# Patient Record
Sex: Female | Born: 1955 | Race: White | Hispanic: No | Marital: Married | Smoking: Never smoker
Health system: Southern US, Community
[De-identification: ages and names within clinical notes are randomized; demographics above are authoritative.]

## PROBLEM LIST (undated history)

## (undated) DIAGNOSIS — E079 Disorder of thyroid, unspecified: Secondary | ICD-10-CM

## (undated) DIAGNOSIS — K219 Gastro-esophageal reflux disease without esophagitis: Secondary | ICD-10-CM

## (undated) DIAGNOSIS — R011 Cardiac murmur, unspecified: Secondary | ICD-10-CM

## (undated) DIAGNOSIS — F419 Anxiety disorder, unspecified: Secondary | ICD-10-CM

## (undated) DIAGNOSIS — R600 Localized edema: Secondary | ICD-10-CM

## (undated) DIAGNOSIS — M1990 Unspecified osteoarthritis, unspecified site: Secondary | ICD-10-CM

## (undated) DIAGNOSIS — I38 Endocarditis, valve unspecified: Secondary | ICD-10-CM

## (undated) DIAGNOSIS — J45909 Unspecified asthma, uncomplicated: Secondary | ICD-10-CM

## (undated) DIAGNOSIS — R079 Chest pain, unspecified: Secondary | ICD-10-CM

## (undated) DIAGNOSIS — E559 Vitamin D deficiency, unspecified: Secondary | ICD-10-CM

## (undated) DIAGNOSIS — I079 Rheumatic tricuspid valve disease, unspecified: Secondary | ICD-10-CM

## (undated) DIAGNOSIS — K829 Disease of gallbladder, unspecified: Secondary | ICD-10-CM

## (undated) DIAGNOSIS — R0600 Dyspnea, unspecified: Secondary | ICD-10-CM

## (undated) DIAGNOSIS — K589 Irritable bowel syndrome without diarrhea: Secondary | ICD-10-CM

## (undated) DIAGNOSIS — K449 Diaphragmatic hernia without obstruction or gangrene: Secondary | ICD-10-CM

## (undated) DIAGNOSIS — E039 Hypothyroidism, unspecified: Secondary | ICD-10-CM

## (undated) DIAGNOSIS — M2550 Pain in unspecified joint: Secondary | ICD-10-CM

## (undated) DIAGNOSIS — E785 Hyperlipidemia, unspecified: Secondary | ICD-10-CM

## (undated) DIAGNOSIS — D649 Anemia, unspecified: Secondary | ICD-10-CM

## (undated) DIAGNOSIS — E538 Deficiency of other specified B group vitamins: Secondary | ICD-10-CM

## (undated) DIAGNOSIS — K5900 Constipation, unspecified: Secondary | ICD-10-CM

## (undated) DIAGNOSIS — M259 Joint disorder, unspecified: Secondary | ICD-10-CM

## (undated) DIAGNOSIS — I10 Essential (primary) hypertension: Secondary | ICD-10-CM

## (undated) DIAGNOSIS — L659 Nonscarring hair loss, unspecified: Secondary | ICD-10-CM

## (undated) HISTORY — DX: Gastro-esophageal reflux disease without esophagitis: K21.9

## (undated) HISTORY — DX: Rheumatic tricuspid valve disease, unspecified: I07.9

## (undated) HISTORY — DX: Nonscarring hair loss, unspecified: L65.9

## (undated) HISTORY — DX: Dyspnea, unspecified: R06.00

## (undated) HISTORY — DX: Endocarditis, valve unspecified: I38

## (undated) HISTORY — DX: Chest pain, unspecified: R07.9

## (undated) HISTORY — DX: Unspecified asthma, uncomplicated: J45.909

## (undated) HISTORY — DX: Hypothyroidism, unspecified: E03.9

## (undated) HISTORY — DX: Deficiency of other specified B group vitamins: E53.8

## (undated) HISTORY — DX: Diaphragmatic hernia without obstruction or gangrene: K44.9

## (undated) HISTORY — DX: Disease of gallbladder, unspecified: K82.9

## (undated) HISTORY — DX: Pain in unspecified joint: M25.50

## (undated) HISTORY — DX: Unspecified osteoarthritis, unspecified site: M19.90

## (undated) HISTORY — DX: Cardiac murmur, unspecified: R01.1

## (undated) HISTORY — DX: Localized edema: R60.0

## (undated) HISTORY — PX: KNEE SURGERY: SHX244

## (undated) HISTORY — DX: Joint disorder, unspecified: M25.9

## (undated) HISTORY — DX: Constipation, unspecified: K59.00

## (undated) HISTORY — DX: Irritable bowel syndrome, unspecified: K58.9

## (undated) HISTORY — DX: Vitamin D deficiency, unspecified: E55.9

## (undated) HISTORY — DX: Essential (primary) hypertension: I10

## (undated) HISTORY — DX: Hyperlipidemia, unspecified: E78.5

## (undated) HISTORY — DX: Anemia, unspecified: D64.9

## (undated) HISTORY — DX: Anxiety disorder, unspecified: F41.9

## (undated) HISTORY — DX: Disorder of thyroid, unspecified: E07.9

---

## 1981-07-15 HISTORY — PX: TMJ ARTHROPLASTY: SHX1066

## 1992-11-14 HISTORY — PX: MYOMECTOMY: SHX85

## 1997-02-12 HISTORY — PX: ABDOMINAL HYSTERECTOMY: SHX81

## 1999-11-27 ENCOUNTER — Other Ambulatory Visit: Admission: RE | Admit: 1999-11-27 | Discharge: 1999-11-27 | Payer: Self-pay | Admitting: Obstetrics & Gynecology

## 2000-03-03 ENCOUNTER — Encounter: Admission: RE | Admit: 2000-03-03 | Discharge: 2000-03-03 | Payer: Self-pay | Admitting: Family Medicine

## 2000-03-03 ENCOUNTER — Encounter: Payer: Self-pay | Admitting: Family Medicine

## 2000-08-21 ENCOUNTER — Other Ambulatory Visit: Admission: RE | Admit: 2000-08-21 | Discharge: 2000-08-21 | Payer: Self-pay | Admitting: Family Medicine

## 2000-09-01 ENCOUNTER — Encounter: Admission: RE | Admit: 2000-09-01 | Discharge: 2000-09-01 | Payer: Self-pay | Admitting: Family Medicine

## 2000-09-01 ENCOUNTER — Encounter: Payer: Self-pay | Admitting: Family Medicine

## 2001-01-22 ENCOUNTER — Encounter (INDEPENDENT_AMBULATORY_CARE_PROVIDER_SITE_OTHER): Payer: Self-pay | Admitting: Specialist

## 2001-01-22 ENCOUNTER — Ambulatory Visit (HOSPITAL_COMMUNITY): Admission: RE | Admit: 2001-01-22 | Discharge: 2001-01-22 | Payer: Self-pay | Admitting: Gastroenterology

## 2001-02-05 ENCOUNTER — Other Ambulatory Visit: Admission: RE | Admit: 2001-02-05 | Discharge: 2001-02-05 | Payer: Self-pay | Admitting: Obstetrics & Gynecology

## 2001-07-06 ENCOUNTER — Encounter: Payer: Self-pay | Admitting: Family Medicine

## 2001-07-06 ENCOUNTER — Encounter: Admission: RE | Admit: 2001-07-06 | Discharge: 2001-07-06 | Payer: Self-pay | Admitting: Family Medicine

## 2001-07-08 ENCOUNTER — Encounter: Payer: Self-pay | Admitting: Family Medicine

## 2001-07-08 ENCOUNTER — Encounter: Admission: RE | Admit: 2001-07-08 | Discharge: 2001-07-08 | Payer: Self-pay | Admitting: Family Medicine

## 2001-11-03 ENCOUNTER — Encounter: Admission: RE | Admit: 2001-11-03 | Discharge: 2001-11-03 | Payer: Self-pay | Admitting: Obstetrics & Gynecology

## 2001-11-03 ENCOUNTER — Encounter: Payer: Self-pay | Admitting: Obstetrics & Gynecology

## 2002-02-16 ENCOUNTER — Other Ambulatory Visit: Admission: RE | Admit: 2002-02-16 | Discharge: 2002-02-16 | Payer: Self-pay | Admitting: Obstetrics & Gynecology

## 2003-02-03 ENCOUNTER — Encounter: Admission: RE | Admit: 2003-02-03 | Discharge: 2003-02-03 | Payer: Self-pay | Admitting: Obstetrics & Gynecology

## 2003-02-03 ENCOUNTER — Encounter: Payer: Self-pay | Admitting: Obstetrics & Gynecology

## 2003-02-06 ENCOUNTER — Encounter (HOSPITAL_COMMUNITY): Admission: RE | Admit: 2003-02-06 | Discharge: 2003-03-08 | Payer: Self-pay | Admitting: Preventative Medicine

## 2003-03-23 ENCOUNTER — Other Ambulatory Visit: Admission: RE | Admit: 2003-03-23 | Discharge: 2003-03-23 | Payer: Self-pay | Admitting: Obstetrics & Gynecology

## 2003-10-17 ENCOUNTER — Encounter: Admission: RE | Admit: 2003-10-17 | Discharge: 2003-10-17 | Payer: Self-pay | Admitting: Obstetrics & Gynecology

## 2004-04-05 ENCOUNTER — Encounter: Admission: RE | Admit: 2004-04-05 | Discharge: 2004-04-05 | Payer: Self-pay | Admitting: Obstetrics & Gynecology

## 2004-06-03 ENCOUNTER — Other Ambulatory Visit: Admission: RE | Admit: 2004-06-03 | Discharge: 2004-06-03 | Payer: Self-pay | Admitting: Obstetrics & Gynecology

## 2005-04-11 ENCOUNTER — Encounter: Admission: RE | Admit: 2005-04-11 | Discharge: 2005-04-11 | Payer: Self-pay | Admitting: Obstetrics & Gynecology

## 2005-08-27 ENCOUNTER — Other Ambulatory Visit: Admission: RE | Admit: 2005-08-27 | Discharge: 2005-08-27 | Payer: Self-pay | Admitting: Obstetrics & Gynecology

## 2006-02-12 HISTORY — PX: CHOLECYSTECTOMY: SHX55

## 2006-06-03 ENCOUNTER — Encounter: Admission: RE | Admit: 2006-06-03 | Discharge: 2006-06-03 | Payer: Self-pay | Admitting: Obstetrics & Gynecology

## 2006-07-06 ENCOUNTER — Encounter: Admission: RE | Admit: 2006-07-06 | Discharge: 2006-07-06 | Payer: Self-pay | Admitting: Family Medicine

## 2006-07-11 ENCOUNTER — Inpatient Hospital Stay (HOSPITAL_COMMUNITY): Admission: AD | Admit: 2006-07-11 | Discharge: 2006-07-13 | Payer: Self-pay | Admitting: *Deleted

## 2006-08-03 ENCOUNTER — Ambulatory Visit (HOSPITAL_COMMUNITY): Admission: RE | Admit: 2006-08-03 | Discharge: 2006-08-03 | Payer: Self-pay | Admitting: *Deleted

## 2006-08-03 ENCOUNTER — Encounter (INDEPENDENT_AMBULATORY_CARE_PROVIDER_SITE_OTHER): Payer: Self-pay | Admitting: *Deleted

## 2007-07-23 ENCOUNTER — Encounter: Admission: RE | Admit: 2007-07-23 | Discharge: 2007-07-23 | Payer: Self-pay | Admitting: Obstetrics & Gynecology

## 2008-07-24 ENCOUNTER — Encounter: Admission: RE | Admit: 2008-07-24 | Discharge: 2008-07-24 | Payer: Self-pay | Admitting: Obstetrics & Gynecology

## 2009-07-25 ENCOUNTER — Encounter: Admission: RE | Admit: 2009-07-25 | Discharge: 2009-07-25 | Payer: Self-pay | Admitting: Obstetrics & Gynecology

## 2011-05-02 NOTE — Procedures (Signed)
[place]. [facility]  Patient:    Cynthia Short, Cynthia Short                         MRN: [id number] Proc. Date: 01/22/01 Attending:  Fayrene Fearing L. Randa Evens, M.D. CC:         Dyanne Carrel, M.D.   Procedure Report  PROCEDURE:  Colonoscopy and biopsy.  MEDICATIONS: Fentanyl 70 mcg, Versed 7 mg IV.  ENDOSCOPE:  Adult Olympus video colonoscope.  INDICATION:  Patient with a heme-positive stool, rectal bleeding and diarrhea, with sigmoidoscopy and biopsy showing mucosal colitis.  She was empirically started on Asacol, started having some diarrhea and positive stool.  For this reason, full colonoscopy with attempt to intubate terminal ileum performed.  DESCRIPTION OF PROCEDURE:  Procedure explained to the patient and consent obtained.  With the patient in the left lateral decubitus position, the adult Olympus video colonoscope inserted and advanced under direct visualization. The prep was excellent.  We were able to reach the cecum without difficulty. The ileocecal valve was seen and entered for a short distance.  Terminal ileum was endoscopically normal.  The scope was withdrawn.  The mucosa was completely normal with a good vascular pattern, no ulcerations, edema, or granularity throughout the entire colon.  Random biopsies were taken throughout the colon.  There were no ulcers, no signs of active colitis.  The scope was withdrawn, and the patient tolerated the procedure well.  ASSESSMENT: 1. Rectal bleeding and heme-positive stool, unclear etiology. 2. Small internal hemorrhoids.  PLAN:  Will check pathology and see back in the office in one month and keep on Asacol for now. DD:  01/22/01 TD:  01/23/01 Job: [id number]

## 2011-05-02 NOTE — H&P (Signed)
NAME:  Cynthia Short, Cynthia Short NO.:  [id number]   MEDICAL RECORD NO.:  [id number]          PATIENT TYPE:  INP   LOCATION:  2002                         FACILITY:  [facility]   PHYSICIAN:  Elmore Guise., M.D.DATE OF BIRTH:  10/20/1956   DATE OF ADMISSION:  07/11/2006  DATE OF DISCHARGE:                                HISTORY & PHYSICAL   HISTORY OF PRESENT ILLNESS:  The patient is a very pleasant 55 year old  white female transferred from [facility] for further evaluation.  She  has a past medical history of hypertension, obesity, hypothyroidism, GERD,  strong family history of early coronary disease, questionable history of  glucose intolerance, who presented to an outside hospital with complaints of  chest pain.  The patient states her symptoms started on Thursday and  actually started as a sharp back pain that was between her shoulder blades.  It then progressed to her anterior chest with a burning and pressure-like  sensation in her chest and left arm.  This continued for approximately 30  minutes.  She had no nausea or diaphoresis.  However, she did have mild  dyspnea.  Her symptoms did resolve, however they recurred. This time she  noted more shortness of breath and diaphoresis and questionable exertional  or worsening. She then went to the local hospital ER for further evaluation  there.  She was admitted for observation, had serial cardiac enzymes which  were negative.  She was transferred for further workup.  She states her  symptoms typically last 30 minutes and then resolve.  She has had a  gallbladder workup that showed mild cholelithiasis however no obstruction.  She denies any abdominal pain associated with these episodes.  No fever.  She does have increased exertional dyspnea for the last year and increased  weight gain approximately 40 pounds within the last 5 years.  She has mild  lower extremity edema left greater than right, otherwise review of  systems  are negative.   CURRENT MEDICINES:  1.  Synthroid 75 mcg daily.  2.  Atenolol 100 mg b.i.d.  3.  Prilosec or Protonix.  4.  CombiPatch.   ALLERGIES:  PENICILLIN and STELAZINE.   FAMILY HISTORY:  Strong family history for early coronary disease with three  of her uncles dying in the early 42s secondary to heart attacks.   SOCIAL HISTORY:  She is single.  No tobacco or alcohol.  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   PHYSICAL EXAMINATION:  VITAL SIGNS:  She is afebrile.  Blood pressure  130/85, heart rate 64 and regular.  Her respirations are 18, she is satting  99% on room air.  Her weight is to 256 pounds.  GENERAL:  She is a very pleasant, middle-aged, white female alert and  oriented x4 in no acute distress.  HEENT:  normal.  NECK:  Supple.  No lymphadenopathy, 2+ carotids.  No JVD and no bruits.  LUNGS:  Clear.  HEART:  Regular.  Heart sounds are distant.  There is no rub.  ABDOMEN:  Obese, soft, nontender, nondistended,  no rebound or guarding.  EXTREMITIES:  Trace pretibial edema with 1+ pulses bilaterally.  Femoral  pulses are palpated and deep.   Labs at the outside hospital showed a BUN and creatinine of 7 and 0.8,  potassium level 3.8.  White count of 8.8, hemoglobin of 13.8 and platelets  of 258.  Her cardiac enzymes showed a CPK of 69, 60 and 58, a MB of 0.8, 0.7  and 0.7 and troponin I of less than 0.05 x3.  Her ` were normal at 12.2, 1.0  and 28.6 and D-dimer was negative.   Her ECG showed normal sinus rhythm 62 per minute with nonspecific ST-T wave  changes.  No chest x-ray was sent.   IMPRESSION:  1.  Chest pain.  2.  Multiple cardiac risk factors including obesity, family history,      hypertension, questionable history of glucose intolerance.   PLAN:  Will admit, start daily aspirin 325 mg once daily.  Will give one  dose of Lovenox 1 mg/kg. Will continue her atenolol, add Imdur 30 mg daily.  Will check a PA and lateral  chest x-ray as well as hemoglobin A1c, TSH,  fasting lipids.  We discussed evaluation with stress test versus cardiac  catheterization.  We will proceed with cardiac cath on Monday.  The risks  and benefits were discussed with her at length.      Elmore Guise., M.D.  Electronically Signed     TWK/MEDQ  D:  07/11/2006  T:  07/11/2006  Job:  [id number]

## 2011-05-02 NOTE — Cardiovascular Report (Signed)
NAME:  Cynthia Short, Cynthia Short NO.:  [id number]   MEDICAL RECORD NO.:  [id number]          PATIENT TYPE:  INP   LOCATION:  2002                         FACILITY:  [facility]   PHYSICIAN:  Elmore Guise., M.D.DATE OF BIRTH:  1956/11/19   DATE OF PROCEDURE:  07/13/2006  DATE OF DISCHARGE:                              CARDIAC CATHETERIZATION   DATE OF PROCEDURE:  July 13, 2006   INDICATIONS FOR PROCEDURE:  Continued chest pain.   HISTORY OF PRESENT ILLNESS:  The patient is a very pleasant 55 year old  white female who presented from transfer from an outside hospital with  substernal chest pain.  She has a strong family history of coronary disease  as well as hypertension and obesity.  Because of continued symptoms,  catheterization was planned.   DESCRIPTION OF PROCEDURE:  The patient was brought to the cardiac  catheterization laboratory.  She was prepped and draped in sterile fashion.  Approximately 15 cc of 1% Lidocaine was used for local anesthesia.  Coronary  angiography, LV angiography, aortic root angiography, limited right femoral  angiogram with Angio-Seal closure device were performed.  The patient  tolerated the procedures well and was transferred from the cardiac  catheterization lab in stable condition.   FINDINGS:  1.  LEFT MAIN:  Normal.  2.  LEFT ANTERIOR DESCENDING:  Large vessel, normal.  3.  DIAGONAL:  Moderate to large vessel, normal appearing.  4.  LCX:  Nondominant, small to moderate size, normal appearing.  5.  OBTUSE MARGINAL 1:  Moderate size vessel, normal appearing.  6.  RIGHT CORONARY ARTERY:  Dominant large vessel, normal appearing.  7.  LEFT VENTRICULOGRAPHY:  Ejection fraction 55 to 60%, no wall motion      abnormalities.  LVDP is 11 mmHg.  8.  AORTIC ROOT ANGIOGRAM:  Showed normal aortic root size, no evidence of      dissection.  9.  LIMITED RIGHT FEMORAL ANGIOGRAM:  Showed no significant disease.      Arteriotomy site suitable for  Angio-Seal closure.   IMPRESSION:  1.  Normal appearing coronary arteries.  2.  Normal left ventricular function with ejection fraction of 55 to 60%.  3.  Normal appearing aortic root.   PLAN:  Aggressive risk factor modification as indicated.      Elmore Guise., M.D.  Electronically Signed     TWK/MEDQ  D:  07/13/2006  T:  07/13/2006  Job:  [id number]   cc:   Bryan Lemma. Manus Gunning, M.D.

## 2011-05-02 NOTE — Discharge Summary (Signed)
NAME:  Cynthia Short, Cynthia Short NO.:  [id number]   MEDICAL RECORD NO.:  [id number]          PATIENT TYPE:  INP   LOCATION:  2002                         FACILITY:  [facility]   PHYSICIAN:  Elmore Guise., M.D.DATE OF BIRTH:  [birth year]   DATE OF ADMISSION:  07/11/2006  DATE OF DISCHARGE:  07/13/2006                                 DISCHARGE SUMMARY   DISCHARGE DIAGNOSES:  1.  Chest pain.  2.  Normal-appearing coronary arteries.  3.  History of hypertension.  4.  History of hypothyroidism.  5.  Obesity.   HISTORY OF PRESENT ILLNESS:  The patient is a very pleasant 55 year old  white female admitted to an outside hospital for evaluation of chest pain.  The patient was transferred to [facility] for further evaluation.   HOSPITAL COURSE:  The patient's hospital course was uncomplicated.  She did  have serial cardiac enzymes performed which were negative.  She underwent  cardiac catheterization on July 13, 2006 which showed normal-appearing  coronary arteries with normal LV systolic function and EF of 55-60%.  She  also was noted to have a normal aortic root and arch with no evidence of  dissection.  Her postprocedure course was uncomplicated.  She has now been  up and ambulatory without any significant problems.  Her atenolol was  decreased during her hospital stay because of bradycardia.  She could not  tolerate Nitrol paste, patch or Imdur because of headaches.   DISCHARGE MEDICATIONS:  1.  Aspirin 81 mg daily.  2.  Synthroid 75 mcg daily.  3.  Protonix 40 mg twice daily (increased from once daily before).  4.  Atenolol 100 mg half a tablet once daily.  5.  CombiPatch as before.  6.  Tylenol Extra Strength one to two every 6 hours as needed for pain.   LABORATORY DATA:  Her lab work on discharge showed hemoglobin of 12.7, white  count of 9.1, platelet count of 251.  She had a BUN and creatinine of 6 and  1.0.  She had normal LFTs.  She had a total  cholesterol of 169,  triglycerides of 145, LDL of 113 and HDL of 27.  She had a hemoglobin A1c of  6.8, a TSH of 2.3.   CHEST X-RAY:  Her chest x-ray showed no acute cardiopulmonary disease.   I did discuss her low HDL with her.  I recommended watching her diet and  increasing her activity level.  She will have this rechecked in  approximately 3-4 months to see if any pharmacologic agents were needed.  I  did discuss that she may need Niaspan in the future if her HDL remains less  than 40.  She will have routine post cath restrictions, and these were  discussed with her at length.  She will have a low-salt and low-cholesterol  diet.   FOLLOWUP:  Her follow-up will be with Dr. Lady Deutscher at [facility] in 1-2 weeks.  She is to call the office at [phone] for an  appointment and with any questions or concerns.  Elmore Guise., M.D.  Electronically Signed     TWK/MEDQ  D:  07/13/2006  T:  07/13/2006  Job:  [id number]   cc:   Bryan Lemma. Manus Gunning, M.D.

## 2011-05-02 NOTE — Op Note (Signed)
NAME:  Cynthia Short, Cynthia Short                ACCOUNT NO.:  [id number]   MEDICAL RECORD NO.:  [id number]          PATIENT TYPE:  AMB   LOCATION:  DAY                          [facility]AC[facility]   PHYSICIAN:  Alfonse Ras, MD   DATE OF BIRTH:  1956/09/18   DATE OF PROCEDURE:  08/03/2006  DATE OF DISCHARGE:                                 OPERATIVE REPORT   PREOPERATIVE DIAGNOSIS:  Symptomatic cholelithiasis.   POSTOPERATIVE DIAGNOSIS:  Symptomatic cholelithiasis.   PROCEDURE:  Laparoscopic cholecystectomy.   SURGEON:  Baruch Merl, MD   ASSISTANT:  Luretha Murphy, MD   ANESTHESIA:  General.   DESCRIPTION:  The patient was taken to the operating room, placed in a  supine position.  After adequate general anesthesia was induced using  endotracheal tube, the patient was placed in the supine position.  Abdomen  was prepped and draped in the normal sterile fashion.  Using a transverse  infraumbilical incision I dissected down the fascia.  Fascia was opened  vertically.  An 0 Vicryl pursestring suture was placed around the fascial  defect and Hasson trocar was introduced into the abdomen.  Pneumoperitoneum  was obtained.  An 11 mm trocar was placed in the subxiphoid region.  Two 5  mm trocars were placed in the right abdomen.  Gallbladder was identified and  retracted cephalad.  A critical view was obtained of the cystic duct and  artery which were both identified in its junction with the gallbladder were  identified.  It was triply clipped and divided sequentially.  Gallbladder  was taken off the gallbladder bed using Bovie electrocautery, placed in an  EndoCatch bag, and removed through the umbilical port.  Adequate hemostasis  was assured.  Pneumoperitoneum was released.  Infraumbilical fascial trocar  site was closed using the 0 Vicryl pursestring suture.  Skin incisions were  closed with subcuticular 4-0 Monocryl.  Steri-Strips and dressings were  applied.  The patient tolerated the  procedure well and went to PACU in good  condition.      Alfonse Ras, MD  Electronically Signed     KRE/MEDQ  D:  08/03/2006  T:  08/03/2006  Job:  [id number]

## 2011-08-19 ENCOUNTER — Other Ambulatory Visit: Payer: Self-pay | Admitting: Obstetrics & Gynecology

## 2011-08-19 DIAGNOSIS — N6310 Unspecified lump in the right breast, unspecified quadrant: Secondary | ICD-10-CM

## 2011-08-22 ENCOUNTER — Ambulatory Visit
Admission: RE | Admit: 2011-08-22 | Discharge: 2011-08-22 | Disposition: A | Discharge status: Home / Self Care | Payer: BC Managed Care – PPO | Source: Ambulatory Visit | Attending: Obstetrics & Gynecology | Admitting: Obstetrics & Gynecology

## 2011-08-22 DIAGNOSIS — N6310 Unspecified lump in the right breast, unspecified quadrant: Secondary | ICD-10-CM

## 2012-01-19 ENCOUNTER — Ambulatory Visit (INDEPENDENT_AMBULATORY_CARE_PROVIDER_SITE_OTHER): Payer: Self-pay | Admitting: General Surgery

## 2012-02-09 ENCOUNTER — Encounter (INDEPENDENT_AMBULATORY_CARE_PROVIDER_SITE_OTHER): Payer: Self-pay | Admitting: General Surgery

## 2012-02-09 ENCOUNTER — Ambulatory Visit (INDEPENDENT_AMBULATORY_CARE_PROVIDER_SITE_OTHER): Payer: BC Managed Care – PPO | Admitting: General Surgery

## 2012-02-09 VITALS — BP 140/90 | HR 72 | Temp 98.5°F | Resp 18 | Ht 63.0 in | Wt 251.4 lb

## 2012-02-09 DIAGNOSIS — N6009 Solitary cyst of unspecified breast: Secondary | ICD-10-CM

## 2012-02-09 NOTE — Patient Instructions (Signed)

## 2012-02-09 NOTE — Progress Notes (Signed)
Patient ID: Cynthia Short, female   DOB: 06-15-1956, 56 y.o.   MRN: [id number]  Chief Complaint  Patient presents with  . Breast Mass    new pt- eval R breast mass    HPI Cynthia Short is a 56 y.o. female.  Referred by Dr. 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 Last August she began noticing some swelling that was episodic on her right breast as well as a change in her nipple. This is not described as inverted is described as pointing in a different direction specifically to the right. She felt a couple of knots in her breast that time. She had evaluated by mammogram and ultrasound in September of 2012. The mammogram had suggestion of than secured oval mass in the right breast upper outer quadrant. An ultrasound there were 2 simple cysts in the right breast 11:00 location 3 cm from the nipple. She was recommended to follow up in one year. These areas have persisted and she is still concerned about them. She is no nipple discharge. She still is having episodic pain and swelling that has been present since then also. She is still concerned about these areas as she can feel them and feels like they're becoming increasingly symptomatic and that is why I am  seeing her today. There is no real relieving or aggravating factors that she notes.  Past Medical History  Diagnosis Date  . Anemia   . Arthritis   . Diabetes mellitus   . GERD (gastroesophageal reflux disease)   . Heart murmur   . Hypertension   . Thyroid disease     hypothyroidism    Past Surgical History  Procedure Date  . Tmj arthroplasty 8/82  . Myomectomy 12/93  . Abdominal hysterectomy 3/98  . Cholecystectomy 02/2006    Family History  Problem Relation Age of Onset  . Cancer Father     lung  . Heart disease Maternal Uncle   . Cancer Maternal Grandmother     colon  . Heart disease Maternal Grandfather   . Cancer Paternal Grandmother     colon     Social History History  Substance Use Topics  . Smoking status: Never Smoker   . Smokeless tobacco: Not on file  . Alcohol Use: No    Allergies  Allergen Reactions  . Penicillins     Current Outpatient Prescriptions  Medication Sig Dispense Refill  . aspirin 81 MG tablet Take 81 mg by mouth daily.      [name] atenolol (TENORMIN) 50 MG tablet Take 50 mg by mouth daily.      [name] estradiol (VIVELLE-DOT) 0.1 MG/24HR Place 1 patch onto the skin 2 (two) times a week.      . levothyroxine (SYNTHROID, LEVOTHROID) 75 MCG tablet Take 75 mcg by mouth daily.      [name] losartan (COZAAR) 50 MG tablet Take 50 mg by mouth daily.      . pravastatin (PRAVACHOL) 20 MG tablet Take 20 mg by mouth daily.        Review of Systems Review of Systems  Constitutional: Negative for fever, chills and unexpected weight change.  HENT: Positive for sore throat. Negative for hearing loss, congestion, trouble swallowing and voice change.   Eyes: Negative for visual disturbance.  Respiratory: Negative for cough and wheezing.   Cardiovascular: Negative for chest pain, palpitations and leg swelling.  Gastrointestinal: Negative for nausea, vomiting, abdominal pain, diarrhea, constipation,  blood in stool, abdominal distention and anal bleeding.  Genitourinary: Negative for hematuria, vaginal bleeding and difficulty urinating.  Musculoskeletal: Negative for arthralgias.  Skin: Negative for rash and wound.  Neurological: Negative for seizures, syncope and headaches.  Hematological: Negative for adenopathy. Does not bruise/bleed easily.  Psychiatric/Behavioral: Negative for confusion.    Blood pressure 140/90, pulse 72, temperature 98.5 F (36.9 C), temperature source Temporal, resp. rate 18, height 5\' 3"  (1.6 m), weight 251 lb 6.4 oz (114.034 kg).  Physical Exam Physical Exam  Vitals reviewed. Constitutional: She appears well-developed and well-nourished.  Neck: Neck supple.  Pulmonary/Chest: Right breast exhibits  no inverted nipple (nipple with faint redness), no mass, no nipple discharge, no skin change and no tenderness. Left breast exhibits inverted nipple (longstanding). Left breast exhibits no mass, no nipple discharge, no skin change and no tenderness. Breasts are symmetrical.    Lymphadenopathy:    She has no cervical adenopathy.    She has no axillary adenopathy.       Right: No supraclavicular adenopathy present.       Left: No supraclavicular adenopathy present.    Data Reviewed DIGITAL DIAGNOSTIC BILATERAL MAMMOGRAM WITH CAD AND RIGHT BREAST  ULTRASOUND:  Comparison: Prior exams  Findings: The breast parenchymal pattern is heterogeneously dense  and nodular, which may limit the sensitivity of mammography. There  is a suggestion of an obscured oval mass in the right breast upper  outer quadrant at the site of the patient's questioned palpable  finding. No worrisome finding is seen in either breast otherwise.  Mammographic images were processed with CAD.  On physical exam, I do not appreciate right nipple inversion.  There is no skin abnormality. I do not palpate an abnormality at  the site of the patient's questioned palpable finding.  Ultrasound is performed, showing two anechoic simple appearing  cysts in the right breast 11 o'clock location 3 cm from the nipple  measuring 1.0 x 0.6 x 0.6 cm collectively. This is in the area of  the questioned palpable finding but not immediately subjacent to  the area identified by the patient. No worrisome finding is seen.  IMPRESSION:  No evidence for malignancy. The patient is counseled to contact  her referring physician if the clinical symptoms do not resolve.  Otherwise, screening mammography is recommended in 1 year. Findings  and recommendations discussed with the patient and provided in  written form at the time of the exam.  BI-RADS CATEGORY 2: Benign finding(s).    Assessment   Right breast cysts and tenderness    Plan   I  don't really identify anything concerning on my exam today. I think that the best course of action due to the fact that her symptoms have persisted and worsened would be to repeat her mammogram and ultrasound as of now would be 6 months since the last one. I will plan on following up after that both reexamine her and look at the nipple on the right side again as well. I tried reassured her today that I do not think that these are associated with a breast cancer but we will try to ensure that we do everything we can to prove that they are not.        Lluvia Gwynne 02/09/2012, 1:16 PM

## 2012-02-17 ENCOUNTER — Other Ambulatory Visit: Payer: BC Managed Care – PPO

## 2012-02-20 ENCOUNTER — Ambulatory Visit
Admission: RE | Admit: 2012-02-20 | Discharge: 2012-02-20 | Disposition: A | Discharge status: Home / Self Care | Payer: BC Managed Care – PPO | Source: Ambulatory Visit | Attending: General Surgery | Admitting: General Surgery

## 2012-02-20 DIAGNOSIS — N6009 Solitary cyst of unspecified breast: Secondary | ICD-10-CM

## 2012-07-15 ENCOUNTER — Ambulatory Visit (HOSPITAL_COMMUNITY): Payer: BC Managed Care – PPO | Attending: Internal Medicine

## 2012-07-15 ENCOUNTER — Ambulatory Visit (INDEPENDENT_AMBULATORY_CARE_PROVIDER_SITE_OTHER): Payer: BC Managed Care – PPO | Admitting: Cardiology

## 2012-07-15 ENCOUNTER — Encounter: Payer: Self-pay | Admitting: Cardiology

## 2012-07-15 VITALS — BP 140/80 | HR 62 | Ht 63.0 in | Wt 247.0 lb

## 2012-07-15 DIAGNOSIS — R0989 Other specified symptoms and signs involving the circulatory and respiratory systems: Secondary | ICD-10-CM

## 2012-07-15 DIAGNOSIS — K219 Gastro-esophageal reflux disease without esophagitis: Secondary | ICD-10-CM

## 2012-07-15 DIAGNOSIS — E119 Type 2 diabetes mellitus without complications: Secondary | ICD-10-CM

## 2012-07-15 DIAGNOSIS — E1169 Type 2 diabetes mellitus with other specified complication: Secondary | ICD-10-CM

## 2012-07-15 DIAGNOSIS — E669 Obesity, unspecified: Secondary | ICD-10-CM

## 2012-07-15 DIAGNOSIS — I517 Cardiomegaly: Secondary | ICD-10-CM | POA: Insufficient documentation

## 2012-07-15 DIAGNOSIS — I10 Essential (primary) hypertension: Secondary | ICD-10-CM | POA: Insufficient documentation

## 2012-07-15 DIAGNOSIS — J45909 Unspecified asthma, uncomplicated: Secondary | ICD-10-CM

## 2012-07-15 DIAGNOSIS — R0609 Other forms of dyspnea: Secondary | ICD-10-CM | POA: Insufficient documentation

## 2012-07-15 NOTE — Progress Notes (Signed)
Echocardiogram performed.  

## 2012-07-15 NOTE — Patient Instructions (Signed)
We will schedule you for an echocardiogram  Taper off atenolol. Take 25 mg daily for 1 week then 12.5 mg daily for 1 week, then discontinue  If your Echo is normal I would recommend a daily aerobic exercise program to build up your conditioning

## 2012-07-15 NOTE — Assessment & Plan Note (Addendum)
I think her symptoms of dyspnea are predominantly related to reactive airway disease and morbid obesity with hypoventilation and deconditioning. I think it is very unlikely that her symptoms are ischemic in nature especially given her normal cardiac catheterization in 2007. I recommended an echocardiogram to assess her LV and valvular function. This will also help screen for pulmonary hypertension. Since there may be a bronchospastic component to her symptoms I recommended tapering off of her atenolol. If she needs additional blood pressure lowering we can increase her losartan dose. She is to continue on her antireflux therapy. If her echocardiogram is unremarkable I would recommend a regular aerobic exercise program to build up her conditioning. May want to consider a sleep study to rule out obstructive sleep apnea. Weight loss is much encouraged.

## 2012-07-15 NOTE — Progress Notes (Signed)
[name] Date of Birth: October 22, 1956 Medical Record #[id number]  History of Present Illness: [name] is seen at the request of Dr. Manus Gunning for evaluation of dyspnea. She is a pleasant 56 year old white female with history of morbid obesity and diabetes mellitus type 2. She also has a history of hypertension. She reports that over this past summer she has experienced symptoms of increased dyspnea on exertion associated with a tightness. She now notes that if she has to walk up 2 flights of stairs she has to stop and rest. The symptoms have increased over the past 2 weeks. She reports that she had a chest x-ray that showed some inflammation and she was started on an inhaler for asthma. This did resolve the tightness in her chest but her shortness of breath on exertion has persisted. She was planting some things in her garden and she would have to stop after planting each item and rest. She is also had a prickly sensation in her chest and jaw. She does have a history of gastroesophageal reflux disease with frequent indigestion. She reports that many years ago she had a nuclear stress test. In 2007 she was admitted with chest pain and had a cardiac catheterization which demonstrated normal coronary anatomy and normal LV function. In addition to the above symptoms she has noted a cough that is mostly nonproductive.  Current Outpatient Prescriptions on File Prior to Visit  Medication Sig Dispense Refill  . aspirin 81 MG tablet Take 81 mg by mouth daily.      [name] atenolol (TENORMIN) 50 MG tablet Take 50 mg by mouth daily.      [name] estradiol (VIVELLE-DOT) 0.1 MG/24HR Place 1 patch onto the skin 2 (two) times a week.      . levothyroxine (SYNTHROID, LEVOTHROID) 75 MCG tablet Take 75 mcg by mouth daily.      [name] losartan (COZAAR) 50 MG tablet Take 50 mg by mouth daily.      . pravastatin (PRAVACHOL) 20 MG tablet Take 20 mg by mouth daily.      . SYMBICORT 80-4.5 MCG/ACT inhaler Inhale 2 puffs into the lungs  Twice daily.        Allergies  Allergen Reactions  . Penicillins     Past Medical History  Diagnosis Date  . Anemia   . Arthritis   . Diabetes mellitus   . GERD (gastroesophageal reflux disease)   . Heart murmur   . Hypertension   . Thyroid disease     hypothyroidism    Past Surgical History  Procedure Date  . Tmj arthroplasty 8/82  . Myomectomy 12/93    uterine fibroids  . Abdominal hysterectomy 3/98  . Cholecystectomy 02/2006    History  Smoking status  . Never Smoker   Smokeless tobacco  . Not on file    History  Alcohol Use No    Family History  Problem Relation Age of Onset  . Cancer Father     lung  . Heart disease Maternal Uncle   . Cancer Maternal Grandmother     colon  . Heart disease Maternal Grandfather   . Cancer Paternal Grandmother     colon    Review of Systems: The review of systems is positive for a weight gain of 30 pounds over the past 2 years. She does have frequent indigestion.  All other systems were reviewed and are negative.  Physical Exam: BP 140/80  Pulse 62  Ht 5\' 3"  (1.6 m)  Wt 112.038  kg (247 lb)  BMI 43.75 kg/m2 She is a morbidly obese white female in no acute distress. She is normocephalic, atraumatic. Pupils are equal round and reactive to light and accommodation. Sclera clear. Oropharynx is clear. Neck is supple without JVD, adenopathy, thyromegaly, or bruits. Lungs are clear. Cardiac exam reveals a regular rate and rhythm without gallop, murmur, or click. Abdomen is morbidly obese, soft, nontender. Pedal pulses are 2+ and symmetric. She has no edema or cyanosis. Skin is warm and dry. She is alert and oriented x3. Cranial nerves II through XII are intact.   LABORATORY DATA: ECG today demonstrates normal sinus rhythm. There is poor R wave progression in the precordial leads. The patient brings her a report of a [facility] screening. She had normal carotid Dopplers and normal ankle brachial indices. There is no evidence of  abdominal aortic aneurysm. Body mass index was 43.  Assessment / Plan:

## 2012-07-16 ENCOUNTER — Other Ambulatory Visit: Payer: Self-pay | Admitting: Obstetrics & Gynecology

## 2012-07-16 DIAGNOSIS — Z1231 Encounter for screening mammogram for malignant neoplasm of breast: Secondary | ICD-10-CM

## 2012-08-11 ENCOUNTER — Telehealth: Payer: Self-pay | Admitting: Cardiology

## 2012-08-11 MED ORDER — ATENOLOL 25 MG PO TABS: 25.0000 mg | ORAL_TABLET | Freq: Every day | ORAL | Status: DC

## 2012-08-11 NOTE — Telephone Encounter (Signed)
Patient called no answer.LMTC. 

## 2012-08-11 NOTE — Telephone Encounter (Signed)
Patient called no answer.Left message on personal voice mail spoke with Dr.Jordan he advised can restart atenolol 25 mg daily.Advised to check with PCP about your acid reflux.Advised to call back if needed.

## 2012-08-11 NOTE — Telephone Encounter (Signed)
Pt was taken off atenilol, BP is fine but heart rate is up, what should she do?  pls call [phone] ok to leave message

## 2012-08-11 NOTE — Telephone Encounter (Signed)
Spoke to patient she stated atenolol was stopped at last visit due to starting symbicort.States she is having fast heart beat 78 to 125 beats/min.States she feels tired ,hears heart beating in ears.States B/P 116/79. Will check with Dr.Jordan and call her back.

## 2012-09-13 ENCOUNTER — Ambulatory Visit
Admission: RE | Admit: 2012-09-13 | Discharge: 2012-09-13 | Disposition: A | Discharge status: Home / Self Care | Payer: BC Managed Care – PPO | Source: Ambulatory Visit | Attending: Obstetrics & Gynecology | Admitting: Obstetrics & Gynecology

## 2012-09-13 DIAGNOSIS — Z1231 Encounter for screening mammogram for malignant neoplasm of breast: Secondary | ICD-10-CM

## 2012-09-17 ENCOUNTER — Encounter: Payer: Self-pay | Admitting: Cardiology

## 2012-09-17 ENCOUNTER — Ambulatory Visit (INDEPENDENT_AMBULATORY_CARE_PROVIDER_SITE_OTHER): Payer: BC Managed Care – PPO | Admitting: Cardiology

## 2012-09-17 DIAGNOSIS — R0989 Other specified symptoms and signs involving the circulatory and respiratory systems: Secondary | ICD-10-CM

## 2012-09-17 DIAGNOSIS — I10 Essential (primary) hypertension: Secondary | ICD-10-CM

## 2012-09-17 NOTE — Progress Notes (Signed)
[name] Date of Birth: Mar 30, 1956 Medical Record #[id number]  History of Present Illness: Cynthia Short is seen for followup of dyspnea. Since her last visit she reports her shortness of breath has gradually improved. This correlates with starting on Symbicort. We tried to use taper off of her atenolol but she developed increased heart rate and symptoms of fatigue and her heart beating in her ears. She was resumed on 25 mg per day and has done better. She is trying to walk. Her echocardiogram was normal.  Current Outpatient Prescriptions on File Prior to Visit  Medication Sig Dispense Refill  . aspirin 81 MG tablet Take 81 mg by mouth daily.      [name] atenolol (TENORMIN) 25 MG tablet Take 1 tablet (25 mg total) by mouth daily.  30 tablet  6  . estradiol (VIVELLE-DOT) 0.1 MG/24HR Place 1 patch onto the skin 2 (two) times a week.      . levothyroxine (SYNTHROID, LEVOTHROID) 75 MCG tablet Take 75 mcg by mouth daily.      [name] losartan (COZAAR) 50 MG tablet Take 50 mg by mouth daily.      . metFORMIN (GLUCOPHAGE-XR) 500 MG 24 hr tablet Take 1,000 mg by mouth daily with breakfast.       . pravastatin (PRAVACHOL) 20 MG tablet Take 20 mg by mouth daily.      . SYMBICORT 80-4.5 MCG/ACT inhaler Inhale 2 puffs into the lungs Twice daily.        Allergies  Allergen Reactions  . Penicillins     Past Medical History  Diagnosis Date  . Anemia   . Arthritis   . Diabetes mellitus   . GERD (gastroesophageal reflux disease)   . Heart murmur   . Hypertension   . Thyroid disease     hypothyroidism    Past Surgical History  Procedure Date  . Tmj arthroplasty 8/82  . Myomectomy 12/93    uterine fibroids  . Abdominal hysterectomy 3/98  . Cholecystectomy 02/2006    History  Smoking status  . Never Smoker   Smokeless tobacco  . Not on file    History  Alcohol Use No    Family History  Problem Relation Age of Onset  . Cancer Father     lung  . Heart disease Maternal Uncle   .  Cancer Maternal Grandmother     colon  . Heart disease Maternal Grandfather   . Cancer Paternal Grandmother     colon    Review of Systems: As noted in history of present illness. All other systems were reviewed and are negative.  Physical Exam: BP 130/84  Pulse 72  Ht 5\' 3"  (1.6 m)  Wt 248 lb 12.8 oz (112.855 kg)  BMI 44.07 kg/m2 She is a morbidly obese white female in no acute distress. HEENT is unremarkable. Neck is supple without JVD, adenopathy, thyromegaly, or bruits. Lungs are clear. Cardiac exam reveals a regular rate and rhythm without gallop, murmur, or click. Abdomen is morbidly obese, soft, nontender. Pedal pulses are 2+ and symmetric. She has no edema or cyanosis.   LABORATORY DATA:   Assessment / Plan: 1. Dyspnea. I think this is related to her morbid obesity, deconditioning, and reactive airway disease. Echocardiogram was normal. I recommended continued focus on weight loss and regular aerobic exercise. I would recommend she continue on Symbicort. I doubt that atenolol is playing a major role and she will continue on her low-dose. If she should have worsening dyspnea I  would consider referral to pulmonary with sleep study.

## 2012-09-17 NOTE — Patient Instructions (Signed)
Continue to focus on weight loss and conditioning with regular aerobic exercise.  I will see you as needed.

## 2013-04-25 ENCOUNTER — Other Ambulatory Visit: Payer: Self-pay | Admitting: Cardiology

## 2013-07-05 ENCOUNTER — Other Ambulatory Visit: Payer: Self-pay | Admitting: Obstetrics & Gynecology

## 2013-07-05 DIAGNOSIS — N63 Unspecified lump in unspecified breast: Secondary | ICD-10-CM

## 2013-07-13 ENCOUNTER — Ambulatory Visit
Admission: RE | Admit: 2013-07-13 | Discharge: 2013-07-13 | Disposition: A | Discharge status: Home / Self Care | Payer: BC Managed Care – PPO | Source: Ambulatory Visit | Attending: Obstetrics & Gynecology | Admitting: Obstetrics & Gynecology

## 2013-07-13 DIAGNOSIS — N63 Unspecified lump in unspecified breast: Secondary | ICD-10-CM

## 2013-11-18 ENCOUNTER — Other Ambulatory Visit: Payer: Self-pay | Admitting: Cardiology

## 2013-11-19 ENCOUNTER — Other Ambulatory Visit: Payer: Self-pay | Admitting: Cardiology

## 2013-12-22 ENCOUNTER — Other Ambulatory Visit: Payer: Self-pay | Admitting: Cardiology

## 2013-12-23 ENCOUNTER — Other Ambulatory Visit: Payer: Self-pay | Admitting: Cardiology

## 2013-12-27 ENCOUNTER — Other Ambulatory Visit: Payer: Self-pay | Admitting: Cardiology

## 2014-03-09 ENCOUNTER — Encounter (HOSPITAL_COMMUNITY): Payer: Self-pay | Admitting: Dietician

## 2014-03-09 NOTE — Progress Notes (Signed)
[facility] Diabetes Class Completion  Date:March 09, 2014  Time: 1730  Pt attended [facility]'s Diabetes [facility] Education Class on March 09, 2014.   Patient was educated on the following topics:   -Survival skills (signs and symptoms of hyperglycemia and hypoglycemia, treatment for hypoglycemia, ideal levels for fasting and postprandial blood sugars, goal Hgb A1c level, foot care basics)  -Recommendations for physical activity   -Carbohydrate metabolism in relation to diabetes   -Meal planning (sources of carbohydrate, carbohydrate counting, meal planning strategies, food label reading, and portion control).  Handouts provided:  -"Diabetes and You: Taking Charge of Your Health"  -"Carbohydrate Counting and Meal Planning"  -"Your Guide to Better Office Visits"   Latoy Labriola A. Channa Hazelett, RD, LDN  

## 2014-04-06 ENCOUNTER — Other Ambulatory Visit: Payer: Self-pay

## 2014-04-06 DIAGNOSIS — Z1231 Encounter for screening mammogram for malignant neoplasm of breast: Secondary | ICD-10-CM

## 2014-04-24 ENCOUNTER — Ambulatory Visit
Admission: RE | Admit: 2014-04-24 | Discharge: 2014-04-24 | Disposition: A | Discharge status: Home / Self Care | Payer: BC Managed Care – PPO | Source: Ambulatory Visit

## 2014-04-24 DIAGNOSIS — Z1231 Encounter for screening mammogram for malignant neoplasm of breast: Secondary | ICD-10-CM

## 2014-07-28 ENCOUNTER — Other Ambulatory Visit: Payer: Self-pay | Admitting: Gastroenterology

## 2014-12-11 ENCOUNTER — Ambulatory Visit
Admission: RE | Admit: 2014-12-11 | Discharge: 2014-12-11 | Disposition: A | Discharge status: Home / Self Care | Payer: BC Managed Care – PPO | Source: Ambulatory Visit | Attending: Orthopedic Surgery | Admitting: Orthopedic Surgery

## 2014-12-11 ENCOUNTER — Other Ambulatory Visit: Payer: Self-pay | Admitting: Orthopedic Surgery

## 2014-12-11 DIAGNOSIS — M25561 Pain in right knee: Secondary | ICD-10-CM

## 2015-05-03 ENCOUNTER — Other Ambulatory Visit: Payer: Self-pay

## 2015-05-03 DIAGNOSIS — Z1231 Encounter for screening mammogram for malignant neoplasm of breast: Secondary | ICD-10-CM

## 2015-05-31 ENCOUNTER — Ambulatory Visit
Admission: RE | Admit: 2015-05-31 | Discharge: 2015-05-31 | Disposition: A | Discharge status: Home / Self Care | Payer: BC Managed Care – PPO | Source: Ambulatory Visit

## 2015-05-31 DIAGNOSIS — Z1231 Encounter for screening mammogram for malignant neoplasm of breast: Secondary | ICD-10-CM

## 2015-06-05 ENCOUNTER — Other Ambulatory Visit: Payer: Self-pay | Admitting: Obstetrics & Gynecology

## 2015-06-06 LAB — CYTOLOGY - PAP

## 2015-11-05 ENCOUNTER — Ambulatory Visit: Payer: BC Managed Care – PPO | Admitting: Podiatry

## 2016-08-15 ENCOUNTER — Other Ambulatory Visit: Payer: Self-pay | Admitting: Obstetrics & Gynecology

## 2016-08-15 DIAGNOSIS — Z1231 Encounter for screening mammogram for malignant neoplasm of breast: Secondary | ICD-10-CM

## 2016-08-26 ENCOUNTER — Ambulatory Visit
Admission: RE | Admit: 2016-08-26 | Discharge: 2016-08-26 | Disposition: A | Discharge status: Home / Self Care | Payer: BC Managed Care – PPO | Source: Ambulatory Visit | Attending: Obstetrics & Gynecology | Admitting: Obstetrics & Gynecology

## 2016-08-26 DIAGNOSIS — Z1231 Encounter for screening mammogram for malignant neoplasm of breast: Secondary | ICD-10-CM

## 2016-09-19 ENCOUNTER — Ambulatory Visit: Payer: BC Managed Care – PPO | Admitting: Internal Medicine

## 2017-07-28 ENCOUNTER — Other Ambulatory Visit: Payer: Self-pay | Admitting: Obstetrics & Gynecology

## 2017-07-28 DIAGNOSIS — Z1231 Encounter for screening mammogram for malignant neoplasm of breast: Secondary | ICD-10-CM

## 2017-09-03 ENCOUNTER — Ambulatory Visit: Payer: BC Managed Care – PPO

## 2017-09-08 ENCOUNTER — Ambulatory Visit
Admission: RE | Admit: 2017-09-08 | Discharge: 2017-09-08 | Disposition: A | Discharge status: Home / Self Care | Payer: BC Managed Care – PPO | Source: Ambulatory Visit | Attending: Obstetrics & Gynecology | Admitting: Obstetrics & Gynecology

## 2017-09-08 DIAGNOSIS — Z1231 Encounter for screening mammogram for malignant neoplasm of breast: Secondary | ICD-10-CM

## 2017-10-12 ENCOUNTER — Ambulatory Visit (INDEPENDENT_AMBULATORY_CARE_PROVIDER_SITE_OTHER): Payer: BC Managed Care – PPO

## 2017-10-12 ENCOUNTER — Encounter: Payer: Self-pay | Admitting: Podiatry

## 2017-10-12 ENCOUNTER — Ambulatory Visit (INDEPENDENT_AMBULATORY_CARE_PROVIDER_SITE_OTHER): Payer: BC Managed Care – PPO | Admitting: Podiatry

## 2017-10-12 DIAGNOSIS — L03031 Cellulitis of right toe: Secondary | ICD-10-CM

## 2017-10-12 DIAGNOSIS — L02611 Cutaneous abscess of right foot: Secondary | ICD-10-CM | POA: Diagnosis not present

## 2017-10-12 DIAGNOSIS — M7751 Other enthesopathy of right foot: Secondary | ICD-10-CM | POA: Diagnosis not present

## 2017-10-12 DIAGNOSIS — I739 Peripheral vascular disease, unspecified: Secondary | ICD-10-CM

## 2017-10-12 DIAGNOSIS — L600 Ingrowing nail: Secondary | ICD-10-CM

## 2017-10-12 MED ORDER — DOXYCYCLINE HYCLATE 100 MG PO TABS: 100.0000 mg | ORAL_TABLET | Freq: Two times a day (BID) | ORAL | 0 refills | Status: DC

## 2017-10-12 NOTE — Patient Instructions (Signed)

## 2017-10-15 LAB — WOUND CULTURE
MICRO NUMBER: 81209336
SPECIMEN QUALITY:: ADEQUATE

## 2017-10-15 NOTE — Progress Notes (Signed)
Subjective:    Patient ID: Cynthia Short, female   DOB: 61 y.o.   MRN: [id number]   HPI Sheridyn Presents the office today for concerns of a chronic ingrown toenail to the right big toe as well as thickening of the toenail. She states that she went to a doctor about a year ago she had part of her nail removed and she was told to the bone spurs any to have partly removed every 6 months or so. Since the procedure she is noticing thickening to the nail. She's of the nails been painful to put a lot of pressure to the toenail. She is diabetic and she states her blood sugar this morning was 110. She denies any drainage or pus and she denies any red streaks. She has no other concerns today.  Review of Systems  All other systems reviewed and are negative.  Past Medical History:  Diagnosis Date  . Anemia   . Arthritis   . Diabetes mellitus   . GERD (gastroesophageal reflux disease)   . Heart murmur   . Hypertension   . Thyroid disease    hypothyroidism    Past Surgical History:  Procedure Laterality Date  . ABDOMINAL HYSTERECTOMY  3/98  . CHOLECYSTECTOMY  02/2006  . MYOMECTOMY  12/93   uterine fibroids  . TMJ ARTHROPLASTY  8/82     Current Outpatient Prescriptions:  .  OMEPRAZOLE PO, Take by mouth., Disp: , Rfl:  .  aspirin 81 MG tablet, Take 81 mg by mouth daily., Disp: , Rfl:  .  atenolol (TENORMIN) 25 MG tablet, TAKE 1 TABLET BY MOUTH ONCE DAILY, Disp: 30 tablet, Rfl: 0 .  doxycycline (VIBRA-TABS) 100 MG tablet, Take 1 tablet (100 mg total) by mouth 2 (two) times daily., Disp: 20 tablet, Rfl: 0 .  estradiol (VIVELLE-DOT) 0.1 MG/24HR, Place 1 patch onto the skin 2 (two) times a week., Disp: , Rfl:  .  levothyroxine (SYNTHROID, LEVOTHROID) 75 MCG tablet, Take 75 mcg by mouth daily., Disp: , Rfl:  .  losartan (COZAAR) 50 MG tablet, Take 50 mg by mouth daily., Disp: , Rfl:  .  metFORMIN (GLUCOPHAGE-XR) 500 MG 24 hr tablet, Take 1,000 mg by mouth daily with breakfast. , Disp: , Rfl:  .  NON  FORMULARY, Zegrid Daily for Acid Reflux, Disp: , Rfl:  .  pravastatin (PRAVACHOL) 20 MG tablet, Take 20 mg by mouth daily., Disp: , Rfl:  .  SYMBICORT 80-4.5 MCG/ACT inhaler, Inhale 2 puffs into the lungs Twice daily., Disp: , Rfl:   Allergies  Allergen Reactions  . Penicillins     Social History   Social History  . Marital status: Single    Spouse name: N/A  . Number of children: 0  . Years of education: N/A   Occupational History  . teacher    Social History Main Topics  . Smoking status: Never Smoker  . Smokeless tobacco: Never Used  . Alcohol use No  . Drug use: No  . Sexual activity: Not on file   Other Topics Concern  . Not on file   Social History Narrative  . No narrative on file         Objective:  Physical Exam General: AAO x3, NAD  Dermatological: The right hallux toenail has evidence of incurvation both the medial and lateral aspect the nail however the nail appears to be discolored, dystrophic and hypertrophic as well. There is tenderness mostly along the nail borders. There is localized edema and  the nail corners. There is no ascending cellulitis or surrounding erythema. There is no areas of flexor crepitus. There is no malodor. During the nail procedure, see below there was purulence expressed.  Vascular: Dorsalis Pedis artery and Posterior Tibial artery pedal pulses are 2/4 bilateral with immedate capillary fill time.  There is no pain with calf compression, swelling, warmth, erythema.   Neruologic: Grossly intact via light touch bilateral.Protective threshold with Semmes Wienstein monofilament intact to all pedal sites bilateral.   Musculoskeletal: No gross boney pedal deformities bilateral. No pain, crepitus, or limitation noted with foot and ankle range of motion bilateral. Muscular strength 5/5 in all groups tested bilateral.  Gait: Unassisted, Nonantalgic.      Assessment:     61 year old female right hallux ingrown toenail with abscess,  onychodystrophy    Plan:     -Treatment options discussed including all alternatives, risks, and complications -Etiology of symptoms were discussed -X-ray was obtained and reviewed. Small bone spur dorsally at the distal phalanx dorsally. -I did an ABI in the office which was normal. The left is 1. bone 5 on the right was 1.15. -At this time, the patient is requesting partial nail removal with chemical matricectomy to the symptomatic portion of the nail. Risks and complications were discussed with the patient for which they understand and written consent was obtained. Under sterile conditions a total of 3 mL of a mixture of 2% lidocaine plain and 0.5% Marcaine plain was infiltrated in a hallux block fashion. Once anesthetized, the skin was prepped in sterile fashion. A tourniquet was then applied. Next the medial and lateral aspect of hallux nail border was then sharply excised making sure to remove the entire offending nail border. During the procedure initially there was quite a bit of purulence expressed mostly from the medial nail border. Once the nails were ensured to be removed area was debrided and the underlying skin was intact. Due to the infection recommended not to apply phenol today. The culture was obtained of the purulence. Silvadene was applied. A dry sterile dressing was applied. After application of the dressing the tourniquet was removed and there is found to be an immediate capillary refill time to the digit. The patient tolerated the procedure well any complications. Post procedure instructions were discussed the patient for which he verbally understood. Follow-up in one week for nail check or sooner if any problems are to arise. Discussed signs/symptoms of infection and directed to call the office immediately should any occur or go directly to the emergency room. In the meantime, encouraged to call the office with any questions, concerns, changes symptoms. -Nails and a Bako for  evaluation of fungus.   Celesta Gentile, DPM

## 2017-10-19 ENCOUNTER — Encounter: Payer: Self-pay | Admitting: Podiatry

## 2017-10-19 ENCOUNTER — Ambulatory Visit (INDEPENDENT_AMBULATORY_CARE_PROVIDER_SITE_OTHER): Payer: BC Managed Care – PPO | Admitting: Podiatry

## 2017-10-19 DIAGNOSIS — L600 Ingrowing nail: Secondary | ICD-10-CM

## 2017-10-19 NOTE — Patient Instructions (Signed)
Continue soaking in epsom salts twice a day followed by antibiotic ointment and a band-aid. Can leave uncovered at night. Continue this until completely healed.  If the area has not healed in 2 weeks, call the office for follow-up appointment, or sooner if any problems arise.  Monitor for any signs/symptoms of infection. Call the office immediately if any occur or go directly to the emergency room. Call with any questions/concerns.  Finish course of antibiotics

## 2017-10-21 ENCOUNTER — Telehealth: Payer: Self-pay | Admitting: *Deleted

## 2017-10-21 NOTE — Telephone Encounter (Signed)
[facility] states no test were marked for nail specimen. 10/21/2017-I informed [facility] testing should be 2 day PCR and Highest Sensitivity with Melanin screen.

## 2017-10-21 NOTE — Progress Notes (Signed)
Subjective: Cynthia Short is a 61 y.o.  female returns to office today for follow up evaluation after having right Hallux medial and lateral partial nail avulsion performed. Patient has been soaking using epsom salts and applying topical antibiotic covered with bandaid daily.  She states that overall she is doing well and her pain is improved.  She denies any drainage or pus coming from the area.  She is continue her antibiotics.  Patient denies fevers, chills, nausea, vomiting. Denies any calf pain, chest pain, SOB.   Objective:  Vitals: Reviewed  General: Well developed, nourished, in no acute distress, alert and oriented x3   Dermatology: Skin is warm, dry and supple bilateral. Right hallux nail border appears to be clean, dry, with mild granular tissue and surrounding scab. There is no surrounding erythema, edema, drainage/purulence. The remaining nails appear unremarkable at this time. There are no other lesions or other signs of infection present.  Neurovascular status: Intact. No lower extremity swelling; No pain with calf compression bilateral.  Musculoskeletal: Notenderness to palpation of the medial and lateral hallux nail folds. Muscular strength within normal limits bilateral.   Assesement and Plan: S/p partial nail avulsion, doing well.   -Continue soaking in epsom salts twice a day followed by antibiotic ointment and a band-aid. Can leave uncovered at night. Continue this until completely healed.  -Discussed culture results.  Continue/finish her course of antibiotics. -Awaiting L culture results. -If the area has not healed in 2 weeks, call the office for follow-up appointment, or sooner if any problems arise.  -Monitor for any signs/symptoms of infection. Call the office immediately if any occur or go directly to the emergency room. Call with any questions/concerns.  Celesta Gentile, DPM

## 2017-11-11 ENCOUNTER — Telehealth: Payer: Self-pay | Admitting: Podiatry

## 2017-11-11 NOTE — Telephone Encounter (Signed)
Left message informing pt it would take 4-6 weeks before the results would return and we would give her a call with instructions.

## 2017-11-11 NOTE — Telephone Encounter (Signed)
I'm calling about lab results for lab work I had done back on 29 October. I have not heard anything about the results for follow up treatment. If you can check into that and call me at [phone]. Thank you.

## 2017-12-25 DIAGNOSIS — M542 Cervicalgia: Secondary | ICD-10-CM | POA: Insufficient documentation

## 2017-12-30 ENCOUNTER — Other Ambulatory Visit: Payer: Self-pay | Admitting: Podiatry

## 2017-12-30 ENCOUNTER — Telehealth: Payer: Self-pay | Admitting: Podiatry

## 2017-12-30 ENCOUNTER — Ambulatory Visit (INDEPENDENT_AMBULATORY_CARE_PROVIDER_SITE_OTHER): Payer: BC Managed Care – PPO | Admitting: Orthopedic Surgery

## 2017-12-30 MED ORDER — DOXYCYCLINE HYCLATE 100 MG PO TABS: 100.0000 mg | ORAL_TABLET | Freq: Two times a day (BID) | ORAL | 0 refills | Status: DC

## 2017-12-30 NOTE — Progress Notes (Signed)
I called the patient to discuss results. She would like to do topical treatment for nail fungus. Ordered compound cream through [employer].   Also she states that the toe is somewhat sensitive and has a little pink area at the base. No pus and no red streaking. I would like to see her but cannot come in until Tuesday. Will schedule for 1:15 on Tuesday but I called in doxycycline and recommended epsom salt soaks. If there is any worsening to call the office and she understood.   Cynthia Short

## 2017-12-30 NOTE — Telephone Encounter (Signed)
Cynthia Short states fungal culture results are ready and will fax to [phone].

## 2017-12-30 NOTE — Telephone Encounter (Signed)
I called the patient and left a message for her to call me back.   I had not received the [facility] results but I called myself and had them faxed over today.

## 2017-12-30 NOTE — Telephone Encounter (Signed)
I saw Dr. Jacqualyn Posey on 12 October 2017 and had treatment for an ingrown toenail. At that time he took a sample to be sent off to the lab. I called back after a month and they said 6-8 weeks and now its 30 December 2017. I have not heard anything about lab results or the follow up appointment that we were supposed to schedule. At this point I need a refill on the antibiotic he prescribed because the toe is infected again. Thank you.

## 2017-12-30 NOTE — Telephone Encounter (Signed)
Yes, you just tried to call me. I'm not going to be available for about the next 20 minutes but you can call and leave a message or you can try calling me back after 5:30 at [phone]. Thank you.

## 2017-12-31 ENCOUNTER — Other Ambulatory Visit: Payer: Self-pay | Admitting: *Deleted

## 2017-12-31 NOTE — Telephone Encounter (Signed)
I spoke with Cynthia Short and he states the results were faxed 12/30/2017 to [phone]. I requested results to be faxed to [phone].

## 2018-01-05 ENCOUNTER — Encounter: Payer: Self-pay | Admitting: Podiatry

## 2018-01-05 ENCOUNTER — Ambulatory Visit: Payer: BC Managed Care – PPO | Admitting: Podiatry

## 2018-01-05 DIAGNOSIS — L603 Nail dystrophy: Secondary | ICD-10-CM

## 2018-01-05 DIAGNOSIS — B351 Tinea unguium: Secondary | ICD-10-CM | POA: Diagnosis not present

## 2018-01-06 ENCOUNTER — Other Ambulatory Visit: Payer: Self-pay | Admitting: Family Medicine

## 2018-01-06 DIAGNOSIS — I739 Peripheral vascular disease, unspecified: Principal | ICD-10-CM

## 2018-01-06 DIAGNOSIS — I779 Disorder of arteries and arterioles, unspecified: Secondary | ICD-10-CM

## 2018-01-06 NOTE — Progress Notes (Signed)
Subjective: Cynthia Short returns the office today for follow-up evaluation of right hallux erythema.  Discussed with her the nail culture results over the phone and she has been using the topical antifungal inserts as well as taking oral antibiotics.  She presents today for evaluation of this after I spoke with her.  She states that the toe hurts at the tip of the toe on the nail but not at the base.  She has noticed some little bit of redness around the toenail.  She is been soaking in Epsom salts as well.  She denies any recent injury or trauma.  She has no other concerns today. Denies any systemic complaints such as fevers, chills, nausea, vomiting. No acute changes since last appointment, and no other complaints at this time.   Objective: AAO x3, NAD DP/PT pulses palpable bilaterally, CRT less than 3 seconds Faint surrounding erythema to the right hallux toenail but there is no increase in warmth or ascending sialitis.  There is no fluctuation or crepitation.  No malodor.  No open sores present.  There is tenderness the distal portion of the toenail but there is no pain along the sides of proximal nail border.  Unable to identify any drainage or pus.  The nail itself is dystrophic, discolored with mild discoloration.  See picture below. No open lesions or pre-ulcerative lesions.  No pain with calf compression, swelling, warmth, erythema      Assessment: Onychodystrophy, onychomycosis right hallux with localized erythema  Plan: -All treatment options discussed with the patient including all alternatives, risks, complications.  -We discussed nail avulsion versus trimming of the nail.  I think the erythema is more from inflammation at this point and there is no other signs of infection.  I did sharply debride the nails remove all the loose toenail today without any complications or bleeding after verbal consent was obtained and the area was smoothed.  On her finished a course of antibiotics as well as  Epsom salts soaks.  Antifungal topically.  Again discussed side effects and success rates.  If the erythema worsens or if not improving discussed nail avulsion but I think that every time she is having a procedure this nails no further damage the toenail and she like to try to save the nail if able.  -Follow-up as scheduled or sooner if needed.  No further questions today.  Trula Slade DPM

## 2018-01-11 ENCOUNTER — Ambulatory Visit
Admission: RE | Admit: 2018-01-11 | Discharge: 2018-01-11 | Disposition: A | Discharge status: Home / Self Care | Payer: BC Managed Care – PPO | Source: Ambulatory Visit | Attending: Family Medicine | Admitting: Family Medicine

## 2018-01-11 DIAGNOSIS — I779 Disorder of arteries and arterioles, unspecified: Secondary | ICD-10-CM

## 2018-01-11 DIAGNOSIS — I739 Peripheral vascular disease, unspecified: Principal | ICD-10-CM

## 2018-02-04 ENCOUNTER — Ambulatory Visit: Payer: BC Managed Care – PPO | Admitting: Podiatry

## 2018-02-04 ENCOUNTER — Encounter: Payer: Self-pay | Admitting: Podiatry

## 2018-02-04 DIAGNOSIS — L603 Nail dystrophy: Secondary | ICD-10-CM

## 2018-02-04 DIAGNOSIS — B351 Tinea unguium: Secondary | ICD-10-CM | POA: Diagnosis not present

## 2018-02-07 NOTE — Progress Notes (Signed)
Subjective: 62 year old female presents the office today for follow-up evaluation of right hallux toenail discoloration, thickening as well as erythema to the toenail.  She finished her course of antibiotics and is doing better.  Occasional staining somewhat pink around the edges but denies any redness or red streaks.  No drainage or pus.  No pain to the nail.  No other concerns today. Denies any systemic complaints such as fevers, chills, nausea, vomiting. No acute changes since last appointment, and no other complaints at this time.   Objective: AAO x3, NAD DP/PT pulses palpable bilaterally, CRT less than 3 seconds Right hallux toenail appears to be dystrophic, discolored yellow discoloration and mild thickening.  There is no pain in the nail there is no significant surrounding erythema there is no ascending cellulitis.  He appears to be normal color on the toenail today.  There is no drainage or pus.  No clinical signs of infection are noted today. No open lesions or pre-ulcerative lesions.  No pain with calf compression, swelling, warmth, erythema  Assessment: Onychodystrophy/onychomycosis right hallux toenail  Plan: -All treatment options discussed with the patient including all alternatives, risks, complications.  -Discussed with her total nail avulsion but she wishes to hold off on this.  There is currently no signs of infection so we will hold off any further oral antibiotics.  I do want her to continue with soaking as well as applied a topical antifungal.  Should there be any signs or symptoms of infection she is continued to have the entire toenail taken off.  She understands this. -Patient encouraged to call the office with any questions, concerns, change in symptoms.   Trula Slade DPM

## 2018-03-04 ENCOUNTER — Ambulatory Visit: Payer: BC Managed Care – PPO | Admitting: Podiatry

## 2018-03-04 ENCOUNTER — Encounter: Payer: Self-pay | Admitting: Podiatry

## 2018-03-04 DIAGNOSIS — B351 Tinea unguium: Secondary | ICD-10-CM

## 2018-03-05 NOTE — Progress Notes (Signed)
Subjective: 62 year old female presents the office today for follow-up evaluation of right hallux toenail discoloration, thickening as well as erythema to the toenail.  She states that she will occasionally get some pinkness on the toenail but denies any drainage or pus.  He gets painful towards the tip of the toenail intermittently.  Denies any other changes or any recent injury.  She has been using the topical ointment to the toenail daily. Denies any systemic complaints such as fevers, chills, nausea, vomiting. No acute changes since last appointment, and no other complaints at this time.   Objective: AAO x3, NAD DP/PT pulses palpable bilaterally, CRT less than 3 seconds Right hallux toenail appears to be dystrophic, discolored yellow discoloration and mild thickening.  The nail appears to be more clear on the base of the nail.  There is tenderness towards the distal portion of the toenail.  There is no surrounding edema, erythema, drainage or pus or any clinical signs of infection noted today.  No open lesions or pre-ulcerative lesions.  No pain with calf compression, swelling, warmth, erythema  Assessment: Onychodystrophy/onychomycosis right hallux toenail  Plan: -All treatment options discussed with the patient including all alternatives, risks, complications.  -I again wish to hold off on nail avulsion.  I did sharply debride the nail today without any complications or bleeding.  Continue with topical antifungal.  Monitor for any signs or symptoms of infection to call the office immediately should any occur. -RTC 3 months or sooner if needed.  Trula Slade DPM

## 2018-06-03 ENCOUNTER — Ambulatory Visit: Payer: BC Managed Care – PPO | Admitting: Podiatry

## 2018-06-03 DIAGNOSIS — L603 Nail dystrophy: Secondary | ICD-10-CM | POA: Diagnosis not present

## 2018-06-03 DIAGNOSIS — B351 Tinea unguium: Secondary | ICD-10-CM

## 2018-06-06 NOTE — Progress Notes (Signed)
Subjective: 62 year old female presents the office today for follow-up evaluation of right hallux toenail discoloration, thickening as well as erythema to the toenail.  She does feel that overall the toenail is looking better.  She states that she still gets intermittent pain to the area as well as intermittent redness but she does report that this is getting less frequent.  She denies any drainage or pus and denies any red streaks.  All questions requires the bone spur, toenail removal and as she is diabetic she is concerned about infection.  She has no other concerns today. Denies any systemic complaints such as fevers, chills, nausea, vomiting. No acute changes since last appointment, and no other complaints at this time.   Objective: AAO x3, NAD DP/PT pulses palpable bilaterally, CRT less than 3 seconds Right hallux toenail appears to be dystrophic, discolored yellow discoloration and mild thickening.  It does appear that that the nail is growing out appears to be more normal in color.  There is no significant erythema identified today and there is no ascending cellulitis.  No drainage or pus identified.  No tenderness palpation identified today. No open lesions or pre-ulcerative lesions. No pain with calf compression, swelling, warmth, erythema      Assessment: Onychodystrophy/onychomycosis right hallux toenail  Plan: -All treatment options discussed with the patient including all alternatives, risks, complications.  -We long discussion regards to treatment options.  The nail is looking better and there is no signs of infection.  We had a discussion regards to continue or giving out versus toenail removal.  As the toenails are better and her pain is so improved I am hesitant to the toenail at this point.  Also she has another doctor and was told that she had a bone spur to the toe.  We did the toenail off then the bone spur could cause issues as well as there will be no protection of the  nail.  After discussion she elected to proceed with treatment of the toenail today which I did without any complications or bleeding and continue with topical antifungal and will continue to monitor very closely.  Call in 3 months or sooner if any issues are to arise and she agrees to this plan has no further questions or concerns.  Trula Slade DPM

## 2018-08-12 ENCOUNTER — Encounter (INDEPENDENT_AMBULATORY_CARE_PROVIDER_SITE_OTHER): Payer: BC Managed Care – PPO

## 2018-08-12 ENCOUNTER — Ambulatory Visit: Payer: BC Managed Care – PPO | Admitting: Podiatry

## 2018-08-17 ENCOUNTER — Ambulatory Visit: Payer: BC Managed Care – PPO | Admitting: Podiatry

## 2018-08-17 ENCOUNTER — Encounter: Payer: Self-pay | Admitting: Podiatry

## 2018-08-17 DIAGNOSIS — B351 Tinea unguium: Secondary | ICD-10-CM

## 2018-08-21 NOTE — Progress Notes (Signed)
Subjective: 62 year old female presents the office today for follow-up evaluation of right hallux toenail discoloration, thickening as well as erythema to the toenail.  She does have the toenail is getting better.  She still has some intermittent redness and pain to the area but overall is getting better.  Currently denies any redness or drainage or any pain.  We will follow-up with the nails much better. Denies any systemic complaints such as fevers, chills, nausea, vomiting. No acute changes since last appointment, and no other complaints at this time.   Objective: AAO x3, NAD DP/PT pulses palpable bilaterally, CRT less than 3 seconds Right hallux toenail appears to improved in color.  There is no pain to the nail there is no swelling redness or drainage or any clinical signs of infection today.  Mildly hypertrophic but overall the nail appears to be much improved.  No open lesions or pre-ulcerative lesions. No pain with calf compression, swelling, warmth, erythema        Assessment: Onychodystrophy/onychomycosis right hallux toenail  Plan: -All treatment options discussed with the patient including all alternatives, risks, complications.  -We gets a long discussion regards to treatment options.  For now and continue with topical antifungal continue to monitor.  We will hold off any surgery for the bone spur or removal of the nail she is doing better.  We will follow-up in 3 months at her request or sooner if needed.  Trula Slade DPM

## 2018-08-26 ENCOUNTER — Encounter (INDEPENDENT_AMBULATORY_CARE_PROVIDER_SITE_OTHER): Payer: Self-pay | Admitting: Family Medicine

## 2018-08-26 ENCOUNTER — Ambulatory Visit (INDEPENDENT_AMBULATORY_CARE_PROVIDER_SITE_OTHER): Payer: BC Managed Care – PPO | Admitting: Family Medicine

## 2018-08-26 VITALS — BP 135/78 | HR 53 | Ht 63.0 in | Wt 225.0 lb

## 2018-08-26 DIAGNOSIS — Z6839 Body mass index (BMI) 39.0-39.9, adult: Secondary | ICD-10-CM

## 2018-08-26 DIAGNOSIS — Z9189 Other specified personal risk factors, not elsewhere classified: Secondary | ICD-10-CM

## 2018-08-26 DIAGNOSIS — E119 Type 2 diabetes mellitus without complications: Secondary | ICD-10-CM

## 2018-08-26 DIAGNOSIS — R0602 Shortness of breath: Secondary | ICD-10-CM

## 2018-08-26 DIAGNOSIS — Z0289 Encounter for other administrative examinations: Secondary | ICD-10-CM

## 2018-08-26 DIAGNOSIS — Z1331 Encounter for screening for depression: Secondary | ICD-10-CM

## 2018-08-26 DIAGNOSIS — E7849 Other hyperlipidemia: Secondary | ICD-10-CM | POA: Diagnosis not present

## 2018-08-26 DIAGNOSIS — R5383 Other fatigue: Secondary | ICD-10-CM | POA: Diagnosis not present

## 2018-08-26 DIAGNOSIS — E038 Other specified hypothyroidism: Secondary | ICD-10-CM

## 2018-08-26 DIAGNOSIS — I10 Essential (primary) hypertension: Secondary | ICD-10-CM

## 2018-08-27 LAB — MICROALBUMIN / CREATININE URINE RATIO
Creatinine, Urine: 37.2 mg/dL
MICROALBUM., U, RANDOM: 11 ug/mL
Microalb/Creat Ratio: 29.6 mg/g creat (ref 0.0–30.0)

## 2018-08-27 LAB — CBC WITH DIFFERENTIAL
BASOS: 0 %
Basophils Absolute: 0 10*3/uL (ref 0.0–0.2)
EOS (ABSOLUTE): 0.3 10*3/uL (ref 0.0–0.4)
Eos: 4 %
HEMATOCRIT: 39 % (ref 34.0–46.6)
HEMOGLOBIN: 12.6 g/dL (ref 11.1–15.9)
IMMATURE GRANULOCYTES: 0 %
Immature Grans (Abs): 0 10*3/uL (ref 0.0–0.1)
LYMPHS ABS: 2.1 10*3/uL (ref 0.7–3.1)
Lymphs: 28 %
MCH: 28.3 pg (ref 26.6–33.0)
MCHC: 32.3 g/dL (ref 31.5–35.7)
MCV: 88 fL (ref 79–97)
MONOCYTES: 7 %
Monocytes Absolute: 0.5 10*3/uL (ref 0.1–0.9)
Neutrophils Absolute: 4.4 10*3/uL (ref 1.4–7.0)
Neutrophils: 61 %
RBC: 4.45 x10E6/uL (ref 3.77–5.28)
RDW: 13.1 % (ref 12.3–15.4)
WBC: 7.3 10*3/uL (ref 3.4–10.8)

## 2018-08-27 LAB — FOLATE: Folate: 6.5 ng/mL (ref >3.0)

## 2018-08-27 LAB — COMPREHENSIVE METABOLIC PANEL
ALBUMIN: 4.3 g/dL (ref 3.6–4.8)
ALT: 18 IU/L (ref 0–32)
AST: 20 IU/L (ref 0–40)
Albumin/Globulin Ratio: 1.4 (ref 1.2–2.2)
Alkaline Phosphatase: 86 IU/L (ref 39–117)
BUN / CREAT RATIO: 15 (ref 12–28)
BUN: 10 mg/dL (ref 8–27)
Bilirubin Total: 0.3 mg/dL (ref 0.0–1.2)
CO2: 24 mmol/L (ref 20–29)
CREATININE: 0.67 mg/dL (ref 0.57–1.00)
Calcium: 8.9 mg/dL (ref 8.7–10.3)
Chloride: 98 mmol/L (ref 96–106)
GFR calc non Af Amer: 95 mL/min/{1.73_m2} (ref >59)
GFR, EST AFRICAN AMERICAN: 110 mL/min/{1.73_m2} (ref >59)
GLUCOSE: 123 mg/dL — AB (ref 65–99)
Globulin, Total: 3.1 g/dL (ref 1.5–4.5)
Potassium: 4.7 mmol/L (ref 3.5–5.2)
Sodium: 139 mmol/L (ref 134–144)
TOTAL PROTEIN: 7.4 g/dL (ref 6.0–8.5)

## 2018-08-27 LAB — TSH: TSH: 2.32 u[IU]/mL (ref 0.450–4.500)

## 2018-08-27 LAB — VITAMIN B12: VITAMIN B 12: 224 pg/mL — AB (ref 232–1245)

## 2018-08-27 LAB — LIPID PANEL WITH LDL/HDL RATIO
CHOLESTEROL TOTAL: 131 mg/dL (ref 100–199)
HDL: 42 mg/dL (ref >39)
LDL CALC: 71 mg/dL (ref 0–99)
LDl/HDL Ratio: 1.7 ratio (ref 0.0–3.2)
TRIGLYCERIDES: 91 mg/dL (ref 0–149)
VLDL CHOLESTEROL CAL: 18 mg/dL (ref 5–40)

## 2018-08-27 LAB — VITAMIN D 25 HYDROXY (VIT D DEFICIENCY, FRACTURES): Vit D, 25-Hydroxy: 19.2 ng/mL — ABNORMAL LOW (ref 30.0–100.0)

## 2018-08-27 LAB — T3: T3, Total: 133 ng/dL (ref 71–180)

## 2018-08-27 LAB — HEMOGLOBIN A1C
Est. average glucose Bld gHb Est-mCnc: 134 mg/dL
Hgb A1c MFr Bld: 6.3 % — ABNORMAL HIGH (ref 4.8–5.6)

## 2018-08-27 LAB — INSULIN, RANDOM: INSULIN: 14.9 u[IU]/mL (ref 2.6–24.9)

## 2018-08-27 LAB — T4, FREE: Free T4: 1.47 ng/dL (ref 0.82–1.77)

## 2018-08-31 NOTE — Progress Notes (Signed)
.  Office: [phone]  /  Fax: [phone]   HPI:   Chief Complaint: OBESITY  Cynthia Short (MR# [id number]) is a 62 y.o. female who presents on 08/31/2018 for obesity evaluation and treatment. Current BMI is Body mass index is 39.86 kg/m.Cynthia Short has struggled with obesity for years and has been unsuccessful in either losing weight or maintaining long term weight loss. Cynthia Short attended our information session and states she is currently in the action stage of change and ready to dedicate time achieving and maintaining a healthier weight.  Cynthia Short states her family eats meals together she thinks her family will eat healthier with  her her desired weight loss is 91 to 96 lbs she has been heavy most of  her life she started gaining weight after college her heaviest weight ever was 265 lbs. she has significant food cravings issues  she snacks frequently in the evenings she skips meals frequently she is frequently drinking liquids with calories she frequently makes poor food choices she has problems with excessive hunger  she frequently eats larger portions than normal  she has binge eating behaviors she struggles with emotional eating    Fatigue Cynthia Short feels her energy is lower than it should be. This has worsened with weight gain and has not worsened recently. Cynthia Short admits to daytime somnolence and  admits to waking up still tired. Patient is at risk for obstructive sleep apnea. Patent has a history of symptoms of daytime fatigue, morning fatigue, morning headache and hypertension. Patient generally gets 6 or 7 hours of sleep per night, and states they generally have restless sleep. Snoring is present. Apneic episodes are not present. Epworth Sleepiness Score is 5  Dyspnea on exertion Cynthia Short notes increasing shortness of breath with exercising and seems to be worsening over time with weight gain. She notes getting out of breath sooner with activity than she used to. This has not gotten worse  recently. Cynthia Short denies orthopnea.  Diabetes II [name] has a diagnosis of diabetes type II. Cynthia Short states fasting BGs range between 90 and 130, mostly between 110 and 120's and she denies any hypoglycemic episodes. Cynthia Short is on metformin currently.  She is attempting to work on intensive lifestyle modifications including diet, exercise, and weight loss to help control her blood glucose levels.  Hyperlipidemia Cynthia Short has hyperlipidemia and she is attempting to improve her cholesterol levels with intensive lifestyle modification including a low saturated fat diet, exercise and weight loss. Cynthia Short is on simvastatin currently. She denies any chest pain or myalgias.  Hypertension Cynthia Short is a 62 y.o. female with hypertension. Cynthia Short is on losartan and atenolol currently and her blood pressure is stable. Cynthia Short denies chest pain or headache. She is working weight loss to help control her blood pressure with the goal of decreasing her risk of heart attack and stroke. Cynthia Short blood pressure is currently controlled.  At risk for cardiovascular disease Cynthia Short is at a higher than average risk for cardiovascular disease due to obesity, diabetes, hyperlipidemia and hypertension. She currently denies any chest pain.  Hypothyroid Cynthia Short has a diagnosis of hypothyroidism. She is on synthroid 75 mcg daily. She denies hot or cold intolerance or palpitations, but does admit to ongoing fatigue.  Depression Screen Cynthia Short's Food and Mood (modified PHQ-9) score was  Depression screen PHQ 2/9 08/26/2018  Decreased Interest 2  Down, Depressed, Hopeless 2  PHQ - 2 Score 4  Altered sleeping 3  Tired, decreased energy 3  Change in appetite  2  Feeling bad or failure about yourself  3  Trouble concentrating 0  Moving slowly or fidgety/restless 1  Suicidal thoughts 0  PHQ-9 Score 16  Difficult doing work/chores Somewhat difficult    ALLERGIES: Allergies  Allergen Reactions   Penicillins     Trifluoperazine     MEDICATIONS: Current Outpatient Medications on File Prior to Visit  Medication Sig Dispense Refill   aspirin 81 MG tablet Take 81 mg by mouth daily.     atenolol (TENORMIN) 25 MG tablet TAKE 1 TABLET BY MOUTH ONCE DAILY 30 tablet 0   estradiol (VIVELLE-DOT) 0.1 MG/24HR Place 1 patch onto the skin 2 (two) times a week.     levothyroxine (SYNTHROID, LEVOTHROID) 75 MCG tablet Take 75 mcg by mouth daily.     losartan (COZAAR) 50 MG tablet Take 50 mg by mouth daily.     metFORMIN (GLUCOPHAGE-XR) 500 MG 24 hr tablet Take 1,000 mg by mouth daily with breakfast.      omeprazole (PRILOSEC) 20 MG capsule Take 1 capsule by mouth daily.      simvastatin (ZOCOR) 10 MG tablet Take 10 mg by mouth daily.     SYMBICORT 80-4.5 MCG/ACT inhaler Inhale 2 puffs into the lungs Twice daily.     No current facility-administered medications on file prior to visit.     PAST MEDICAL HISTORY: Past Medical History:  Diagnosis Date   Anemia    Anxiety    Arthritis    Asthma    Chest pain    Constipation    Diabetes mellitus    Dyspnea    Gallbladder problem    GERD (gastroesophageal reflux disease)    Hair loss    Heart murmur    Hiatal hernia    Hip problem    Hyperlipemia    Hypertension    IBS (irritable bowel syndrome)    Joint pain    Knee problem    Leg edema    Thyroid disease    hypothyroidism   Tricuspid valve disorder     PAST SURGICAL HISTORY: Past Surgical History:  Procedure Laterality Date   ABDOMINAL HYSTERECTOMY  3/98   CHOLECYSTECTOMY  02/2006   KNEE SURGERY     MYOMECTOMY  12/93   uterine fibroids   TMJ ARTHROPLASTY  8/82    SOCIAL HISTORY: Social History   Tobacco Use   Smoking status: Never Smoker   Smokeless tobacco: Never Used  Substance Use Topics   Alcohol use: No   Drug use: No    FAMILY HISTORY: Family History  Problem Relation Age of Onset   Cancer Father        lung   Diabetes Father      Heart disease Father    Stroke Father    Kidney disease Father    Heart disease Maternal Uncle    Cancer Maternal Grandmother        colon   Heart disease Maternal Grandfather    Cancer Paternal Grandmother        colon    ROS: Review of Systems  Constitutional: Positive for malaise/fatigue.       Breasts Pain (systs)  HENT: Positive for congestion (nasal stuffiness) and sinus pain.   Eyes:       Wear Glasses or Contacts  Respiratory: Positive for shortness of breath (with activity).   Cardiovascular: Negative for chest pain, palpitations and orthopnea.  Gastrointestinal: Positive for heartburn.  Musculoskeletal: Negative for myalgias.  Muscle or Joint Pain  Endo/Heme/Allergies: Bruises/bleeds easily (bruising).       Negative for hypoglycemia Negative for heat or cold intolerance    PHYSICAL EXAM: Blood pressure 135/78, pulse (!) 53, height 5\' 3"  (1.6 m), weight 225 lb (102.1 kg), SpO2 99 %. Body mass index is 39.86 kg/m. Physical Exam  Constitutional: She is oriented to person, place, and time. She appears well-developed and well-nourished.  HENT:  Head: Normocephalic and atraumatic.  Nose: Nose normal.  Eyes: EOM are normal. No scleral icterus.  Neck: Normal range of motion. Neck supple. No thyromegaly present.  Cardiovascular: Regular rhythm. Bradycardia present.  Murmur heard.  Systolic (early) murmur is present with a grade of 1/6. Pulmonary/Chest: Effort normal. No respiratory distress.  Abdominal: Soft. There is no tenderness.  + obesity  Musculoskeletal: Normal range of motion.  Range of Motion normal in all 4 extremities  Neurological: She is alert and oriented to person, place, and time. Coordination normal.  Skin: Skin is warm and dry.  Psychiatric: She has a normal mood and affect. Her behavior is normal.  Vitals reviewed.   RECENT LABS AND TESTS: BMET    Component Value Date/Time   NA 139 08/26/2018 1055   K 4.7 08/26/2018 1055    CL 98 08/26/2018 1055   CO2 24 08/26/2018 1055   GLUCOSE 123 (H) 08/26/2018 1055   BUN 10 08/26/2018 1055   CREATININE 0.67 08/26/2018 1055   CALCIUM 8.9 08/26/2018 1055   GFRNONAA 95 08/26/2018 1055   GFRAA 110 08/26/2018 1055   Lab Results  Component Value Date   HGBA1C 6.3 (H) 08/26/2018   Lab Results  Component Value Date   INSULIN 14.9 08/26/2018   CBC    Component Value Date/Time   WBC 7.3 08/26/2018 1055   RBC 4.45 08/26/2018 1055   HGB 12.6 08/26/2018 1055   HCT 39.0 08/26/2018 1055   MCV 88 08/26/2018 1055   MCH 28.3 08/26/2018 1055   MCHC 32.3 08/26/2018 1055   RDW 13.1 08/26/2018 1055   LYMPHSABS 2.1 08/26/2018 1055   EOSABS 0.3 08/26/2018 1055   BASOSABS 0.0 08/26/2018 1055   Iron/TIBC/Ferritin/ %Sat No results found for: IRON, TIBC, FERRITIN, IRONPCTSAT Lipid Panel     Component Value Date/Time   CHOL 131 08/26/2018 1055   TRIG 91 08/26/2018 1055   HDL 42 08/26/2018 1055   LDLCALC 71 08/26/2018 1055   Hepatic Function Panel     Component Value Date/Time   PROT 7.4 08/26/2018 1055   ALBUMIN 4.3 08/26/2018 1055   AST 20 08/26/2018 1055   ALT 18 08/26/2018 1055   ALKPHOS 86 08/26/2018 1055   BILITOT 0.3 08/26/2018 1055      Component Value Date/Time   TSH 2.320 08/26/2018 1055   Vitamin D There are no recent lab results  ECG  shows NSR with a rate of 57 BPM INDIRECT CALORIMETER done today shows a VO2 of 273 and a REE of 1899. Her calculated basal metabolic rate is 3007 thus her basal metabolic rate is better than expected.    ASSESSMENT AND PLAN: Other fatigue - Plan: EKG 12-Lead, Vitamin B12, CBC With Differential, Folate, VITAMIN D 25 Hydroxy (Vit-D Deficiency, Fractures)  Shortness of breath on exertion - Plan: CBC With Differential  Type 2 diabetes mellitus without complication, without long-term current use of insulin (HCC) - Plan: Comprehensive metabolic panel, Hemoglobin A1c, Insulin, random, Microalbumin / creatinine urine  ratio  Other hyperlipidemia - Plan: Lipid Panel With LDL/HDL Ratio  Essential hypertension  Other specified hypothyroidism - Plan: T3, T4, free, TSH  Depression screening  At risk for heart disease  Class 2 severe obesity with serious comorbidity and body mass index (BMI) of 39.0 to 39.9 in adult, unspecified obesity type ([place])  PLAN:  Fatigue Deira was informed that her fatigue may be related to obesity, depression or many other causes. Labs will be ordered, and in the meanwhile Janyra has agreed to work on diet, exercise and weight loss to help with fatigue. Proper sleep hygiene was discussed including the need for 7-8 hours of quality sleep each night. A sleep study was not ordered based on symptoms and Epworth score.  Dyspnea on exertion Matisyn's shortness of breath appears to be obesity related and exercise induced. She has agreed to work on weight loss and gradually increase exercise to treat her exercise induced shortness of breath. If Shareece follows our instructions and loses weight without improvement of her shortness of breath, we will plan to refer to pulmonology. We will monitor this condition regularly. Lylee agrees to this plan.  Diabetes II Latressa has been given extensive diabetes education by myself today including ideal fasting and post-prandial blood glucose readings, individual ideal Hgb A1c goals  and hypoglycemia prevention. We discussed the importance of good blood sugar control to decrease the likelihood of diabetic complications such as nephropathy, neuropathy, limb loss, blindness, coronary artery disease, and death. We discussed the importance of intensive lifestyle modification including diet, exercise and weight loss as the first line treatment for diabetes. We will check labs and Arayna agrees to start diet and check her blood sugar two times daily. Krystie agrees to continue her diabetes medications and will follow up at the agreed upon time.  Hyperlipidemia Emmalia  was informed of the American Heart Association Guidelines emphasizing intensive lifestyle modifications as the first line treatment for hyperlipidemia. We discussed many lifestyle modifications today in depth, and Delois will start to work on decreasing saturated fats such as fatty red meat, butter and many fried foods. She will also increase vegetables and lean protein in her diet and continue to work on exercise and weight loss efforts. We will check labs and Tamora agrees to follow up as directed.  Hypertension We discussed sodium restriction, working on healthy weight loss, and a regular exercise program as the means to achieve improved blood pressure control. Keeya agreed with this plan and agreed to follow up as directed. We will check labs and continue to monitor her blood pressure as well as her progress with the above lifestyle modifications. She will continue her medications as prescribed and will watch for signs of hypotension as she continues her lifestyle modifications.  Cardiovascular risk counseling Joselynn was given extended (15 minutes) coronary artery disease prevention counseling today. She is 62 y.o. female and has risk factors for heart disease including obesity, diabetes, hyperlipidemia and hypertension. We discussed intensive lifestyle modifications today with an emphasis on specific weight loss instructions and strategies. Pt was also informed of the importance of increasing exercise and decreasing saturated fats to help prevent heart disease.  Hypothyroid Rogena was informed of the importance of good thyroid control to help with weight loss efforts. She was also informed that supertheraputic thyroid levels are dangerous and will not improve weight loss results.   Depression Screen Shacarra had a strongly positive depression screening. Depression is commonly associated with obesity and often results in emotional eating behaviors. We will monitor this closely and work on CBT to help  improve  the non-hunger eating patterns. Referral to Psychology may be required if no improvement is seen as she continues in our clinic.  Obesity Marysa is currently in the action stage of change and her goal is to continue with weight loss efforts She has agreed to follow the Category 2 plan with the Category 3 plan for dinner Jannat has been instructed to work up to a goal of 150 minutes of combined cardio and strengthening exercise per week for weight loss and overall health benefits. We discussed the following Behavioral Modification Strategies today: increasing lean protein intake, decreasing simple carbohydrates , decrease eating out and work on meal planning and easy cooking plans  Alaria has agreed to follow up with our clinic in 2 weeks and with Dr. Mallie Mussel our bariatric psychologist in 2 weeks. She was informed of the importance of frequent follow up visits to maximize her success with intensive lifestyle modifications for her multiple health conditions. She was informed we would discuss her lab results at her next visit unless there is a critical issue that needs to be addressed sooner. Veleka agreed to keep her next visit at the agreed upon time to discuss these results.    OBESITY BEHAVIORAL INTERVENTION VISIT  Today's visit was # 1   Starting weight: 225 lbs Starting date: 08/26/18 Today's weight : 225 lbs  Today's date: 08/26/2018 Total lbs lost to date: 0   ASK: We discussed the diagnosis of obesity with Cynthia Short today and Kalis agreed to give [name] permission to discuss obesity behavioral modification therapy today.  ASSESS: Jakelin has the diagnosis of obesity and her BMI today is 39.87 Arlesia is in the action stage of change   ADVISE: Mckaylee was educated on the multiple health risks of obesity as well as the benefit of weight loss to improve her health. She was advised of the need for long term treatment and the importance of lifestyle modifications to improve her current  health and to decrease her risk of future health problems.  AGREE: Multiple dietary modification options and treatment options were discussed and  Ravina agreed to follow the recommendations documented in the above note.  ARRANGE: Daryl was educated on the importance of frequent visits to treat obesity as outlined per CMS and USPSTF guidelines and agreed to schedule her next follow up appointment today.   I, Doreene Nest, am acting as transcriptionist for Dennard Nip, MD   I have reviewed the above documentation for accuracy and completeness, and I agree with the above. -Dennard Nip, MD

## 2018-09-06 ENCOUNTER — Encounter (INDEPENDENT_AMBULATORY_CARE_PROVIDER_SITE_OTHER): Payer: Self-pay | Admitting: Family Medicine

## 2018-09-09 ENCOUNTER — Ambulatory Visit (INDEPENDENT_AMBULATORY_CARE_PROVIDER_SITE_OTHER): Payer: BC Managed Care – PPO | Admitting: Psychology

## 2018-09-09 ENCOUNTER — Ambulatory Visit (INDEPENDENT_AMBULATORY_CARE_PROVIDER_SITE_OTHER): Payer: BC Managed Care – PPO | Admitting: Family Medicine

## 2018-09-09 VITALS — BP 107/70 | HR 60 | Temp 98.1°F | Ht 63.0 in | Wt 220.0 lb

## 2018-09-09 DIAGNOSIS — E119 Type 2 diabetes mellitus without complications: Secondary | ICD-10-CM | POA: Diagnosis not present

## 2018-09-09 DIAGNOSIS — E559 Vitamin D deficiency, unspecified: Secondary | ICD-10-CM

## 2018-09-09 DIAGNOSIS — Z9189 Other specified personal risk factors, not elsewhere classified: Secondary | ICD-10-CM

## 2018-09-09 DIAGNOSIS — F3289 Other specified depressive episodes: Secondary | ICD-10-CM | POA: Diagnosis not present

## 2018-09-09 DIAGNOSIS — E538 Deficiency of other specified B group vitamins: Secondary | ICD-10-CM | POA: Diagnosis not present

## 2018-09-09 DIAGNOSIS — Z6839 Body mass index (BMI) 39.0-39.9, adult: Secondary | ICD-10-CM

## 2018-09-09 MED ORDER — VITAMIN B-12 1000 MCG PO TABS: 1000.0000 ug | ORAL_TABLET | Freq: Every day | ORAL | 0 refills | Status: DC

## 2018-09-09 MED ORDER — VITAMIN D (ERGOCALCIFEROL) 1.25 MG (50000 UNIT) PO CAPS: 50000.0000 [IU] | ORAL_CAPSULE | ORAL | 0 refills | Status: DC

## 2018-09-09 NOTE — Progress Notes (Signed)
Office: [phone]  /  Fax: [phone] Date:September 09, 2018 Time Seen: 1:00pm Duration: 45 minutes Provider: Glennie Isle, PsyD Type of Session: Intake for Individual Therapy   Informed Consent: The provider's role was explained to Cynthia Short. The provider reviewed and discussed issues of confidentiality, privacy, and limits therein. Since the clinic is not a 24/7 crisis center, mental health emergency resources were shared and a handout was provided. Cynthia Short verbally acknowledged understanding, and agreed to use mental health emergency resources discussed if needed. In addition to written consent, verbal informed consent for psychological services was obtained from Cynthia Short prior to the initial intake interview. Moreover, Cynthia Short agreed information may be shared with other [facility] providers as needed for coordination of care. Written consent was also provided for this provider to coordinate care with other providers at [facility].   Chief Complaint: Cynthia Short was referred by Dr. Dennard Nip. Per the note for the initial visit with Dr. Dennard Nip on August 26, 2018, Cynthia Short reported experiencing the following: significant food craving issues; snacking frequently in the evening; skipping meals frequently; frequently drinking liquids with calories; frequently making poor food choices; having problems with excessive hunger; frequently eating larger portions than normal; binge eating behaviors; and struggling with emotional eating.  Cynthia Short reported that during her conversation with Dr. Leafy Ro at the first appointment, she acknowledged a history of engaging in emotional eating; therefore, was referred to this provider. Marnell described food as a "comfort" and it has provided a sense of "control." She indicated she used to eat when she is stressed. Cynthia Short noted her comfort foods include the following: bread, pizza, and M&Ms. She noted she could not recall her  last emotional eating episode. She added, it has likely been a "couple years." She explained at that time she lost her father "and a couple other things."   Cynthia Short was asked to complete a questionnaire assessing various behaviors related to emotional eating. Cynthia Short endorsed the following: overeat when you are celebrating, eat certain foods when you are anxious, stressed, depressed, or your feelings are hurt, find food is comforting to you, overeat when you are angry or upset, overeat when you are worried about something, overeat frequently when you are bored or lonely, not worry about what you eat when you are in a good mood, overeat when you are alone, but eat much less when you are with other people and eat as a reward.  HPI: Per the note for the initial visit with Dr. Dennard Nip on August 26, 2018, Cynthia Short reported she has been heavy most of her life and she started gaining weight after college. Her heaviest weight ever was turned 65 pounds. During today's appointment, Cynthia Short reported she would engage in emotional eating when she was feeling lonely. She indicated emotional eating started "pronabably as an adult." Jaziyah noted it was "maybe late 80s, early 58s." She identified that during that age her job was stressful and noted, "I had a necessary hysterectomy." Cynthia Short denied a history of binge eating. She denied a history of purging and engagement in any other compensatory strategies. She has never been diagnosed with an eating disorder.   Mental Status Examination: Cynthia Short arrived early for the appointment. She presented as appropriately dressed and groomed. Cynthia Short appeared her stated age and demonstrated adequate orientation to time, place, person, and purpose of the appointment. She also demonstrated appropriate eye contact. No psychomotor abnormalities or behavioral peculiarities noted. Her mood was euthymic with congruent affect. Her thought  processes were logical, linear, and goal-directed. No  hallucinations, delusions, bizarre thinking or behavior reported or observed. Judgment, insight, and impulse control appeared to be grossly intact. There was no evidence of paraphasias (i.e., errors in speech, gross mispronunciations, and word substitutions), repetition deficits, or disturbances in volume or prosody (i.e., rhythm and intonation). There was no evidence of attention or memory impairments. Tamani denied current suicidal and homicidal ideation, plan, and intent.   The Montreal Cognitive Assessment (MoCA) was administered. The MoCA assesses different cognitive domains: attention and concentration, executive functions, memory, language, visuoconstructional skills, conceptual thinking, calculations, and orientation. Cynthia Short received 28 out of 30 points possible on the MoCA, which is noted in the normal range. A point was lost on a visuospatial/exercutive task requiring Cynthia Short to replicate a visual stimuli. A point was also lost on the delayed recall task, as Cynthia Short recalled four out of five words after a short delay. An insertion was made after a category cue was provided. Cynthia Short was able to recall the last word with an additional multiple choice cue.   Family & Psychosocial History: Cynthia Short reported she has been married for almost four years. She noted she has two step-children (age 61 and 40). Cynthia Short noted she retired in 2015. She shared her highest level of education is a bachelor's degree in education. Cynthia Short noted her social support consists of family and friends. She identifies with Christianity.   Medical History:  Past Medical History:  Diagnosis Date  . Anemia   . Anxiety   . Arthritis   . Asthma   . Chest pain   . Constipation   . Diabetes mellitus   . Dyspnea   . Gallbladder problem   . GERD (gastroesophageal reflux disease)   . Hair loss   . Heart murmur   . Hiatal hernia   . Hip problem   . Hyperlipemia   . Hypertension   . IBS (irritable bowel syndrome)   . Joint pain   .  Knee problem   . Leg edema   . Thyroid disease    hypothyroidism  . Tricuspid valve disorder    Past Surgical History:  Procedure Laterality Date  . ABDOMINAL HYSTERECTOMY  3/98  . CHOLECYSTECTOMY  02/2006  . KNEE SURGERY    . MYOMECTOMY  12/93   uterine fibroids  . TMJ ARTHROPLASTY  8/82   Current Outpatient Medications on File Prior to Visit  Medication Sig Dispense Refill  . aspirin 81 MG tablet Take 81 mg by mouth daily.    [name] atenolol (TENORMIN) 25 MG tablet TAKE 1 TABLET BY MOUTH ONCE DAILY 30 tablet 0  . estradiol (VIVELLE-DOT) 0.1 MG/24HR Place 1 patch onto the skin 2 (two) times a week.    . levothyroxine (SYNTHROID, LEVOTHROID) 75 MCG tablet Take 75 mcg by mouth daily.    [name] losartan (COZAAR) 50 MG tablet Take 50 mg by mouth daily.    . metFORMIN (GLUCOPHAGE-XR) 500 MG 24 hr tablet Take 1,000 mg by mouth daily with breakfast.     . omeprazole (PRILOSEC) 20 MG capsule Take 1 capsule by mouth daily.     . simvastatin (ZOCOR) 10 MG tablet Take 10 mg by mouth daily.    . SYMBICORT 80-4.5 MCG/ACT inhaler Inhale 2 puffs into the lungs Twice daily.     No current facility-administered medications on file prior to visit.   Cynthia Short denied a history of head injuries and loss of consciousness.   Mental Health History: Cynthia Short denied a history of  therapeutic services. She denied a history of hospitalization for psychiatric reasons and has never seen a psychiatrist. She has never been prescribed psychotropic medications. Cynthia Short denied a family history of mental health concerns. She denied a trauma history, including sexual, physical, and psychological abuse, as well as neglect.  Cynthia Short described her typical mood as "calm and even keeled." Cynthia Short noted, "I have sleep issues," but attributed it to medical conditions (I.e., menopause and diabetes). She described waking up intermittently throughout the night. Cynthia Short noted she averages five to seven and a half hours of sleep a night. She indicated she  sometimes feels rested when she wakes up, but sometimes does not. Cynthia Short indicated she has not informed her doctor about the sleep fluctuations; therefore, it was recommended she follow-up. Yoshie agreed. Janise denied experiencing the following: social withdrawal; obsessions and compulsions; mania; concentration and attention issues; memory concerns; substance use; worry thoughts; hallucinations and delusions; history of and current engagement in self-harm; and history of and current suicidal and homicidal ideation, plan, and intent.   Jesenya reported her strengths include the following: loyal, trustworthy, supportive, and dependable. She noted being stubborn is a strength and weakness for her. In addition, she described herself as introverted by nature. When dealing with stressors, Rever previously coped by eating comfort food. Now she copes by doing crafts (e.g., jewelry making, floral arrangement), praying, and walking.   Structured Assessment Results: The Patient Health Questionnaire-9 (PHQ-9) is a self-report measure that assesses symptoms and severity of depression over the course of the last two weeks. Shaili obtained a score of zero. Depression screen [facility] 2/9 09/09/2018  Decreased Interest 0  Down, Depressed, Hopeless 0  PHQ - 2 Score 0  Altered sleeping 0  Tired, decreased energy 0  Change in appetite 0  Feeling bad or failure about yourself  0  Trouble concentrating 0  Moving slowly or fidgety/restless 0  Suicidal thoughts 0  PHQ-9 Score 0  Difficult doing work/chores -   The Generalized Anxiety Disorder-7 (GAD-7) is a brief self-report measure that assesses symptoms of anxiety over the course of the last two weeks. Nachelle obtained a score of zero.  GAD 7 : Generalized Anxiety Score 09/09/2018  Nervous, Anxious, on Edge 0  Control/stop worrying 0  Worry too much - different things 0  Trouble relaxing 0  Restless 0  Easily annoyed or irritable 0  Afraid - awful might happen 0  Total GAD  7 Score 0  Anxiety Difficulty Not difficult at all   Interventions: A chart review was conducted prior to the clinical intake interview. The MoCA, PHQ-9, and GAD-7 were administered and a clinical intake interview was completed. In addition, Calaya was asked to complete a Mood and Food questionnaire to assess various behaviors related to emotional eating. Throughout session, empathic reflections and validation was provided. Continuing treatment with this provider was discussed. Psychoeducation regarding emotional versus physical hunger was provided. Melva was given a handout.  Provisional DSM-5 Diagnosis: 311 (F32.8) Other Specified Depressive Disorder, Emotional Eating (per history of symptoms and endorsed symptomatology on the modified PHQ-9 during the initial appointment with Dr. Leafy Ro on August 26, 2018)   Plan: This provider discussed the option to schedule an appointment in one month to conduct a check-in, as Legacy denied experiencing current emotional eating and other symptomatology. Shakara declined future appointments and noted, "I'm not reacting the same to the triggers." She was informed that should she wish to re-initiate services with this provider and she is still established with the  clinic, she can request to re-initiate services. Myleah verbally acknowledged understanding.

## 2018-09-13 NOTE — Progress Notes (Signed)
Office: [phone]  /  Fax: [phone]   HPI:   Chief Complaint: OBESITY Cynthia Short is here to discuss her progress with her obesity treatment plan. She is on the Category 2 plan and is following her eating plan approximately 90 % of the time. She states she is exercising 0 minutes 0 times per week. Cynthia Short did well with weight loss, but struggled to eat all her food. She did well with meal planning and prepping, but struggled to decrease her sweet tea.  Her weight is 220 lb (99.8 kg) today and has had a weight loss of 5 pounds over a period of 5 weeks since her last visit. She has lost 5 lbs since starting treatment with [name].  B12 Deficiency Cynthia Short has a new diagnosis of B12 insufficiency and notes fatigue. She does not have a history of weight loss surgery. She is taking metformin, but is not on B12.   Vitamin D deficiency Cynthia Short has a diagnosis of vitamin D deficiency. She is not currently taking vit D. She admits fatigue and denies nausea, vomiting or muscle weakness.   Diabetes II Cynthia Short has a diagnosis of diabetes type II. Cynthia Short's last A1c was 6.3 and is well controlled on metformin. She is having polyphagia, but this has improved with her diet prescription. She denies any hypoglycemic episodes. She has been working on intensive lifestyle modifications including diet, exercise, and weight loss to help control her blood glucose levels.  At risk for cardiovascular disease Cynthia Short is at a higher than average risk for cardiovascular disease due to diabetes and obesity.   ALLERGIES: Allergies  Allergen Reactions  . Penicillins   . Trifluoperazine     MEDICATIONS: Current Outpatient Medications on File Prior to Visit  Medication Sig Dispense Refill  . aspirin 81 MG tablet Take 81 mg by mouth daily.    [name] atenolol (TENORMIN) 25 MG tablet TAKE 1 TABLET BY MOUTH ONCE DAILY 30 tablet 0  . estradiol (VIVELLE-DOT) 0.1 MG/24HR Place 1 patch onto the skin 2 (two) times a week.    . levothyroxine  (SYNTHROID, LEVOTHROID) 75 MCG tablet Take 75 mcg by mouth daily.    [name] losartan (COZAAR) 50 MG tablet Take 50 mg by mouth daily.    . metFORMIN (GLUCOPHAGE-XR) 500 MG 24 hr tablet Take 1,000 mg by mouth daily with breakfast.     . omeprazole (PRILOSEC) 20 MG capsule Take 1 capsule by mouth daily.     . simvastatin (ZOCOR) 10 MG tablet Take 10 mg by mouth daily.    . SYMBICORT 80-4.5 MCG/ACT inhaler Inhale 2 puffs into the lungs Twice daily.     No current facility-administered medications on file prior to visit.     PAST MEDICAL HISTORY: Past Medical History:  Diagnosis Date  . Anemia   . Anxiety   . Arthritis   . Asthma   . Chest pain   . Constipation   . Diabetes mellitus   . Dyspnea   . Gallbladder problem   . GERD (gastroesophageal reflux disease)   . Hair loss   . Heart murmur   . Hiatal hernia   . Hip problem   . Hyperlipemia   . Hypertension   . IBS (irritable bowel syndrome)   . Joint pain   . Knee problem   . Leg edema   . Thyroid disease    hypothyroidism  . Tricuspid valve disorder     PAST SURGICAL HISTORY: Past Surgical History:  Procedure Laterality Date  .  ABDOMINAL HYSTERECTOMY  3/98  . CHOLECYSTECTOMY  02/2006  . KNEE SURGERY    . MYOMECTOMY  12/93   uterine fibroids  . TMJ ARTHROPLASTY  8/82    SOCIAL HISTORY: Social History   Tobacco Use  . Smoking status: Never Smoker  . Smokeless tobacco: Never Used  Substance Use Topics  . Alcohol use: No  . Drug use: No    FAMILY HISTORY: Family History  Problem Relation Age of Onset  . Cancer Father        lung  . Diabetes Father   . Heart disease Father   . Stroke Father   . Kidney disease Father   . Heart disease Maternal Uncle   . Cancer Maternal Grandmother        colon  . Heart disease Maternal Grandfather   . Cancer Paternal Grandmother        colon    ROS: Review of Systems  Constitutional: Positive for malaise/fatigue and weight loss.  Gastrointestinal: Negative for nausea  and vomiting.  Musculoskeletal:       Negative for muscle weakness.  Endo/Heme/Allergies:       Negative for hypoglycemia. Positive for polyphagia.    PHYSICAL EXAM: Blood pressure 107/70, pulse 60, temperature 98.1 F (36.7 C), temperature source Oral, height 5\' 3"  (1.6 m), weight 220 lb (99.8 kg), SpO2 97 %. Body mass index is 38.97 kg/m. Physical Exam  Constitutional: She is oriented to person, place, and time. She appears well-developed and well-nourished.  Cardiovascular: Normal rate.  Pulmonary/Chest: Effort normal.  Musculoskeletal: Normal range of motion.  Neurological: She is oriented to person, place, and time.  Skin: Skin is warm and dry.  Psychiatric: She has a normal mood and affect. Her behavior is normal.  Vitals reviewed.   RECENT LABS AND TESTS: BMET    Component Value Date/Time   NA 139 08/26/2018 1055   K 4.7 08/26/2018 1055   CL 98 08/26/2018 1055   CO2 24 08/26/2018 1055   GLUCOSE 123 (H) 08/26/2018 1055   BUN 10 08/26/2018 1055   CREATININE 0.67 08/26/2018 1055   CALCIUM 8.9 08/26/2018 1055   GFRNONAA 95 08/26/2018 1055   GFRAA 110 08/26/2018 1055   Lab Results  Component Value Date   HGBA1C 6.3 (H) 08/26/2018   Lab Results  Component Value Date   INSULIN 14.9 08/26/2018   CBC    Component Value Date/Time   WBC 7.3 08/26/2018 1055   RBC 4.45 08/26/2018 1055   HGB 12.6 08/26/2018 1055   HCT 39.0 08/26/2018 1055   MCV 88 08/26/2018 1055   MCH 28.3 08/26/2018 1055   MCHC 32.3 08/26/2018 1055   RDW 13.1 08/26/2018 1055   LYMPHSABS 2.1 08/26/2018 1055   EOSABS 0.3 08/26/2018 1055   BASOSABS 0.0 08/26/2018 1055   Iron/TIBC/Ferritin/ %Sat No results found for: IRON, TIBC, FERRITIN, IRONPCTSAT Lipid Panel     Component Value Date/Time   CHOL 131 08/26/2018 1055   TRIG 91 08/26/2018 1055   HDL 42 08/26/2018 1055   LDLCALC 71 08/26/2018 1055   Hepatic Function Panel     Component Value Date/Time   PROT 7.4 08/26/2018 1055    ALBUMIN 4.3 08/26/2018 1055   AST 20 08/26/2018 1055   ALT 18 08/26/2018 1055   ALKPHOS 86 08/26/2018 1055   BILITOT 0.3 08/26/2018 1055      Component Value Date/Time   TSH 2.320 08/26/2018 1055   Results for Cynthia Short (MRN [id number]) as of 09/13/2018  09:51  Ref. Range 08/26/2018 10:55  Vitamin D, 25-Hydroxy Latest Ref Range: 30.0 - 100.0 ng/mL 19.2 (L)   ASSESSMENT AND PLAN: B12 nutritional deficiency - Plan: vitamin B-12 (CYANOCOBALAMIN) 1000 MCG tablet  Vitamin D deficiency - Plan: Vitamin D, Ergocalciferol, (DRISDOL) 50000 units CAPS capsule  Type 2 diabetes mellitus without complication, without long-term current use of insulin (HCC)  At risk for heart disease  Class 2 severe obesity with serious comorbidity and body mass index (BMI) of 39.0 to 39.9 in adult, unspecified obesity type ([employer])  PLAN:  B12 Deficiency Cynthia Short will work on increasing B12 rich foods in her diet. She will start B12 1095mcg daily. She agrees to follow up in 2 weeks.  Vitamin D Deficiency Cynthia Short was informed that low vitamin D levels contributes to fatigue and are associated with obesity, breast, and colon cancer. She agrees to start to take prescription Vit D @50 ,000 IU every week #4 with no refills and will follow up for routine testing of vitamin D, at least 2-3 times per year. She was informed of the risk of over-replacement of vitamin D and agrees to not increase her dose unless she discusses this with [name] first. Cynthia Short agrees to follow up in 2 weeks.  Diabetes II Cynthia Short has been given extensive diabetes education by myself today including ideal fasting and post-prandial blood glucose readings, individual ideal Hgb A1c goals, and hypoglycemia prevention. We discussed the importance of good blood sugar control to decrease the likelihood of diabetic complications such as nephropathy, neuropathy, limb loss, blindness, coronary artery disease, and death. We discussed the importance of intensive lifestyle  modification including diet, exercise and weight loss as the first line treatment for diabetes. Cynthia Short agrees to continue her metformin and diet. We will recheck labs in 3 months she agrees to follow up at the agreed upon time.  Cardiovascular risk counseling Cynthia Short was given extended (25 minutes) coronary artery disease prevention counseling today. She is 62 y.o. female and has risk factors for heart disease including diabetes and obesity. We discussed intensive lifestyle modifications today with an emphasis on specific weight loss instructions and strategies. Pt was also informed of the importance of increasing exercise and decreasing saturated fats to help prevent heart disease.  Obesity Cynthia Short is currently in the action stage of change. As such, her goal is to continue with weight loss efforts. She has agreed to follow the Category 2 plan. Cynthia Short has been instructed to work up to a goal of 150 minutes of combined cardio and strengthening exercise per week for weight loss and overall health benefits. We discussed the following Behavioral Modification Strategies today: increasing lean protein intake, decreasing simple carbohydrates, increasing vegetables, increase H2O intake, and decrease liquid calories.   Cynthia Short has agreed to follow up with our clinic in 2 weeks. She was informed of the importance of frequent follow up visits to maximize her success with intensive lifestyle modifications for her multiple health conditions.   OBESITY BEHAVIORAL INTERVENTION VISIT  Today's visit was # 2   Starting weight: 225 lbs Starting date: 02/23/18 Today's weight : Weight: 220 lb (99.8 kg)  Today's date: 09/09/2018 Total lbs lost to date: 5  ASK: We discussed the diagnosis of obesity with Cynthia Short today and Cynthia Short agreed to give [name] permission to discuss obesity behavioral modification therapy today.  ASSESS: Sakia has the diagnosis of obesity and her BMI today is 38.98.  Cynthia Short is in the action  stage of change.   ADVISE: Cynthia Short was educated  on the multiple health risks of obesity as well as the benefit of weight loss to improve her health. She was advised of the need for long term treatment and the importance of lifestyle modifications to improve her current health and to decrease her risk of future health problems.  AGREE: Multiple dietary modification options and treatment options were discussed and Cynthia Short agreed to follow the recommendations documented in the above note.  ARRANGE: Cynthia Short was educated on the importance of frequent visits to treat obesity as outlined per CMS and USPSTF guidelines and agreed to schedule her next follow up appointment today.  I, Marcille Blanco, am acting as transcriptionist for Starlyn Skeans, MD  I have reviewed the above documentation for accuracy and completeness, and I agree with the above. -Dennard Nip, MD

## 2018-09-22 ENCOUNTER — Ambulatory Visit (INDEPENDENT_AMBULATORY_CARE_PROVIDER_SITE_OTHER): Payer: BC Managed Care – PPO | Admitting: Family Medicine

## 2018-09-22 VITALS — BP 122/70 | HR 64 | Temp 98.2°F | Ht 63.0 in | Wt 220.0 lb

## 2018-09-22 DIAGNOSIS — E119 Type 2 diabetes mellitus without complications: Secondary | ICD-10-CM

## 2018-09-22 DIAGNOSIS — Z6839 Body mass index (BMI) 39.0-39.9, adult: Secondary | ICD-10-CM | POA: Diagnosis not present

## 2018-09-26 ENCOUNTER — Other Ambulatory Visit (INDEPENDENT_AMBULATORY_CARE_PROVIDER_SITE_OTHER): Payer: Self-pay | Admitting: Family Medicine

## 2018-09-26 DIAGNOSIS — E559 Vitamin D deficiency, unspecified: Secondary | ICD-10-CM

## 2018-09-27 NOTE — Progress Notes (Signed)
Office: [phone]  /  Fax: [phone]   HPI:   Chief Complaint: OBESITY Cynthia Short is here to discuss her progress with her obesity treatment plan. She is on the Category 2 plan and is following her eating plan approximately 75 % of the time. She states she is exercising 0 minutes 0 times per week. Cynthia Short has gotten off track with her eating plan especially for dinner in the last 2 weeks, due to a fluctuating schedule and eating out.  Her weight is 220 lb (99.8 kg) today and has not lost weight since her last visit. She has lost 5 lbs since starting treatment with Cynthia Short.  Diabetes II Cynthia Short has a diagnosis of diabetes type II. [name] is on metformin and notes mild GI upset but not regularly. She has been working on intensive lifestyle modifications including diet, exercise, and weight loss to help control her blood glucose levels. Last A1c was 6.3. She denies hypoglycemia.  ALLERGIES: Allergies  Allergen Reactions  . Penicillins   . Trifluoperazine     MEDICATIONS: Current Outpatient Medications on File Prior to Visit  Medication Sig Dispense Refill  . aspirin 81 MG tablet Take 81 mg by mouth daily.    [name] atenolol (TENORMIN) 25 MG tablet TAKE 1 TABLET BY MOUTH ONCE DAILY 30 tablet 0  . estradiol (VIVELLE-DOT) 0.1 MG/24HR Place 1 patch onto the skin 2 (two) times a week.    . levothyroxine (SYNTHROID, LEVOTHROID) 75 MCG tablet Take 75 mcg by mouth daily.    [name] losartan (COZAAR) 50 MG tablet Take 50 mg by mouth daily.    . metFORMIN (GLUCOPHAGE-XR) 500 MG 24 hr tablet Take 1,000 mg by mouth daily with breakfast.     . omeprazole (PRILOSEC) 20 MG capsule Take 1 capsule by mouth daily.     . simvastatin (ZOCOR) 10 MG tablet Take 10 mg by mouth daily.    . SYMBICORT 80-4.5 MCG/ACT inhaler Inhale 2 puffs into the lungs Twice daily.    . vitamin B-12 (CYANOCOBALAMIN) 1000 MCG tablet Take 1 tablet (1,000 mcg total) by mouth daily. 30 tablet 0  . Vitamin D, Ergocalciferol, (DRISDOL) 50000 units  CAPS capsule Take 1 capsule (50,000 Units total) by mouth every 7 (seven) days. 4 capsule 0   No current facility-administered medications on file prior to visit.     PAST MEDICAL HISTORY: Past Medical History:  Diagnosis Date  . Anemia   . Anxiety   . Arthritis   . Asthma   . Chest pain   . Constipation   . Diabetes mellitus   . Dyspnea   . Gallbladder problem   . GERD (gastroesophageal reflux disease)   . Hair loss   . Heart murmur   . Hiatal hernia   . Hip problem   . Hyperlipemia   . Hypertension   . IBS (irritable bowel syndrome)   . Joint pain   . Knee problem   . Leg edema   . Thyroid disease    hypothyroidism  . Tricuspid valve disorder     PAST SURGICAL HISTORY: Past Surgical History:  Procedure Laterality Date  . ABDOMINAL HYSTERECTOMY  3/98  . CHOLECYSTECTOMY  02/2006  . KNEE SURGERY    . MYOMECTOMY  12/93   uterine fibroids  . TMJ ARTHROPLASTY  8/82    SOCIAL HISTORY: Social History   Tobacco Use  . Smoking status: Never Smoker  . Smokeless tobacco: Never Used  Substance Use Topics  . Alcohol use: No  .  Drug use: No    FAMILY HISTORY: Family History  Problem Relation Age of Onset  . Cancer Father        lung  . Diabetes Father   . Heart disease Father   . Stroke Father   . Kidney disease Father   . Heart disease Maternal Uncle   . Cancer Maternal Grandmother        colon  . Heart disease Maternal Grandfather   . Cancer Paternal Grandmother        colon    ROS: Review of Systems  Constitutional: Negative for weight loss.  Endo/Heme/Allergies:       Negative hypoglycemia    PHYSICAL EXAM: Blood pressure 122/70, pulse 64, temperature 98.2 F (36.8 C), temperature source Oral, height 5\' 3"  (1.6 m), weight 220 lb (99.8 kg), SpO2 98 %. Body mass index is 38.97 kg/m. Physical Exam  Constitutional: She is oriented to person, place, and time. She appears well-developed and well-nourished.  Cardiovascular: Normal rate.    Pulmonary/Chest: Effort normal.  Musculoskeletal: Normal range of motion.  Neurological: She is oriented to person, place, and time.  Skin: Skin is warm and dry.  Psychiatric: She has a normal mood and affect. Her behavior is normal.  Vitals reviewed.   RECENT LABS AND TESTS: BMET    Component Value Date/Time   NA 139 08/26/2018 1055   K 4.7 08/26/2018 1055   CL 98 08/26/2018 1055   CO2 24 08/26/2018 1055   GLUCOSE 123 (H) 08/26/2018 1055   BUN 10 08/26/2018 1055   CREATININE 0.67 08/26/2018 1055   CALCIUM 8.9 08/26/2018 1055   GFRNONAA 95 08/26/2018 1055   GFRAA 110 08/26/2018 1055   Lab Results  Component Value Date   HGBA1C 6.3 (H) 08/26/2018   Lab Results  Component Value Date   INSULIN 14.9 08/26/2018   CBC    Component Value Date/Time   WBC 7.3 08/26/2018 1055   RBC 4.45 08/26/2018 1055   HGB 12.6 08/26/2018 1055   HCT 39.0 08/26/2018 1055   MCV 88 08/26/2018 1055   MCH 28.3 08/26/2018 1055   MCHC 32.3 08/26/2018 1055   RDW 13.1 08/26/2018 1055   LYMPHSABS 2.1 08/26/2018 1055   EOSABS 0.3 08/26/2018 1055   BASOSABS 0.0 08/26/2018 1055   Iron/TIBC/Ferritin/ %Sat No results found for: IRON, TIBC, FERRITIN, IRONPCTSAT Lipid Panel     Component Value Date/Time   CHOL 131 08/26/2018 1055   TRIG 91 08/26/2018 1055   HDL 42 08/26/2018 1055   LDLCALC 71 08/26/2018 1055   Hepatic Function Panel     Component Value Date/Time   PROT 7.4 08/26/2018 1055   ALBUMIN 4.3 08/26/2018 1055   AST 20 08/26/2018 1055   ALT 18 08/26/2018 1055   ALKPHOS 86 08/26/2018 1055   BILITOT 0.3 08/26/2018 1055      Component Value Date/Time   TSH 2.320 08/26/2018 1055    ASSESSMENT AND PLAN: Type 2 diabetes mellitus without complication, without long-term current use of insulin (HCC)  Class 2 severe obesity with serious comorbidity and body mass index (BMI) of 39.0 to 39.9 in adult, unspecified obesity type ([place])  PLAN:  Diabetes II Cynthia Short has been given extensive  diabetes education by myself today including ideal fasting and post-prandial blood glucose readings, individual ideal Hgb A1c goals and hypoglycemia prevention. We discussed the importance of good blood sugar control to decrease the likelihood of diabetic complications such as nephropathy, neuropathy, limb loss, blindness, coronary artery disease, and  death. We discussed the importance of intensive lifestyle modification including diet, exercise and weight loss as the first line treatment for diabetes. Zamiya agrees to continue taking metformin, and will continue diet and exercise. We will recheck labs in 2 months. Chelcie agrees to follow up with our clinic in 2 to 3 weeks.  I spent > than 50% of the 25 minute visit on counseling as documented in the note.  Obesity Elfrieda is currently in the action stage of change. As such, her goal is to continue with weight loss efforts She has agreed to keep a food journal with 400-550 calories and 40 grams of protein at supper daily and follow the Category 2 plan Asako has been instructed to work up to a goal of 150 minutes of combined cardio and strengthening exercise per week for weight loss and overall health benefits. We discussed the following Behavioral Modification Strategies today: increasing lean protein intake and decreasing simple carbohydrates    Rhyen has agreed to follow up with our clinic in 2 to 3 weeks. She was informed of the importance of frequent follow up visits to maximize her success with intensive lifestyle modifications for her multiple health conditions.   OBESITY BEHAVIORAL INTERVENTION VISIT  Today's visit was # 3   Starting weight: 225 lbs Starting date: 08/26/18 Today's weight : 220 lbs  Today's date: 09/22/2018 Total lbs lost to date: 5    ASK: We discussed the diagnosis of obesity with [name] today and Sennie agreed to give Cynthia Short permission to discuss obesity behavioral modification therapy today.  ASSESS: Madyn has  the diagnosis of obesity and her BMI today is 30.98 Mishika is in the action stage of change   ADVISE: Claudeen was educated on the multiple health risks of obesity as well as the benefit of weight loss to improve her health. She was advised of the need for long term treatment and the importance of lifestyle modifications to improve her current health and to decrease her risk of future health problems.  AGREE: Multiple dietary modification options and treatment options were discussed and  Alyza agreed to follow the recommendations documented in the above note.  ARRANGE: Jola was educated on the importance of frequent visits to treat obesity as outlined per CMS and USPSTF guidelines and agreed to schedule her next follow up appointment today.  I, Trixie Dredge, am acting as transcriptionist for Dennard Nip, MD  I have reviewed the above documentation for accuracy and completeness, and I agree with the above. -Dennard Nip, MD

## 2018-10-05 ENCOUNTER — Other Ambulatory Visit: Payer: Self-pay | Admitting: Obstetrics & Gynecology

## 2018-10-05 DIAGNOSIS — Z1231 Encounter for screening mammogram for malignant neoplasm of breast: Secondary | ICD-10-CM

## 2018-10-14 ENCOUNTER — Ambulatory Visit (INDEPENDENT_AMBULATORY_CARE_PROVIDER_SITE_OTHER): Payer: BC Managed Care – PPO | Admitting: Family Medicine

## 2018-10-14 VITALS — BP 119/72 | HR 60 | Temp 97.9°F | Ht 63.0 in | Wt 218.0 lb

## 2018-10-14 DIAGNOSIS — Z6838 Body mass index (BMI) 38.0-38.9, adult: Secondary | ICD-10-CM | POA: Diagnosis not present

## 2018-10-14 DIAGNOSIS — E559 Vitamin D deficiency, unspecified: Secondary | ICD-10-CM | POA: Diagnosis not present

## 2018-10-14 DIAGNOSIS — Z9189 Other specified personal risk factors, not elsewhere classified: Secondary | ICD-10-CM | POA: Diagnosis not present

## 2018-10-14 MED ORDER — VITAMIN D (ERGOCALCIFEROL) 1.25 MG (50000 UNIT) PO CAPS: 50000.0000 [IU] | ORAL_CAPSULE | ORAL | 0 refills | Status: DC

## 2018-10-14 NOTE — Progress Notes (Signed)
Office: [phone]  /  Fax: [phone]   HPI:   Chief Complaint: OBESITY Cynthia Short is here to discuss her progress with her obesity treatment plan. She is on the keep a food journal with 400-550 calories and 40 grams of protein at supper daily and follow the Category 2 plan and is following her eating plan approximately 50 % of the time. She states she is exercising 0 minutes 0 times per week. Cynthia Short continues to do well with weight loss on her Category 2 plan. She had increased temptations and eating out but did well being mindful of her choices.  Her weight is 218 lb (98.9 kg) today and has had a weight loss of 2 pounds over a period of 3 weeks since her last visit. She has lost 7 lbs since starting treatment with [name].  Vitamin D Deficiency Cynthia Short has a diagnosis of vitamin D deficiency. She is stable on prescription Vit D, but level is not yet at goal. She denies nausea, vomiting or muscle weakness.  At risk for osteopenia and osteoporosis Cynthia Short is at higher risk of osteopenia and osteoporosis due to vitamin D deficiency.   ALLERGIES: Allergies  Allergen Reactions  . Penicillins   . Trifluoperazine     MEDICATIONS: Current Outpatient Medications on File Prior to Visit  Medication Sig Dispense Refill  . aspirin 81 MG tablet Take 81 mg by mouth daily.    [name] atenolol (TENORMIN) 25 MG tablet TAKE 1 TABLET BY MOUTH ONCE DAILY 30 tablet 0  . estradiol (VIVELLE-DOT) 0.1 MG/24HR Place 1 patch onto the skin 2 (two) times a week.    . levothyroxine (SYNTHROID, LEVOTHROID) 75 MCG tablet Take 75 mcg by mouth daily.    [name] losartan (COZAAR) 50 MG tablet Take 50 mg by mouth daily.    . metFORMIN (GLUCOPHAGE-XR) 500 MG 24 hr tablet Take 1,000 mg by mouth daily with breakfast.     . omeprazole (PRILOSEC) 20 MG capsule Take 1 capsule by mouth daily.     . simvastatin (ZOCOR) 10 MG tablet Take 10 mg by mouth daily.    . SYMBICORT 80-4.5 MCG/ACT inhaler Inhale 2 puffs into the lungs Twice daily.      . vitamin B-12 (CYANOCOBALAMIN) 1000 MCG tablet Take 1 tablet (1,000 mcg total) by mouth daily. 30 tablet 0   No current facility-administered medications on file prior to visit.     PAST MEDICAL HISTORY: Past Medical History:  Diagnosis Date  . Anemia   . Anxiety   . Arthritis   . Asthma   . Chest pain   . Constipation   . Diabetes mellitus   . Dyspnea   . Gallbladder problem   . GERD (gastroesophageal reflux disease)   . Hair loss   . Heart murmur   . Hiatal hernia   . Hip problem   . Hyperlipemia   . Hypertension   . IBS (irritable bowel syndrome)   . Joint pain   . Knee problem   . Leg edema   . Thyroid disease    hypothyroidism  . Tricuspid valve disorder     PAST SURGICAL HISTORY: Past Surgical History:  Procedure Laterality Date  . ABDOMINAL HYSTERECTOMY  3/98  . CHOLECYSTECTOMY  02/2006  . KNEE SURGERY    . MYOMECTOMY  12/93   uterine fibroids  . TMJ ARTHROPLASTY  8/82    SOCIAL HISTORY: Social History   Tobacco Use  . Smoking status: Never Smoker  . Smokeless tobacco: Never Used  Substance Use Topics  . Alcohol use: No  . Drug use: No    FAMILY HISTORY: Family History  Problem Relation Age of Onset  . Cancer Father        lung  . Diabetes Father   . Heart disease Father   . Stroke Father   . Kidney disease Father   . Heart disease Maternal Uncle   . Cancer Maternal Grandmother        colon  . Heart disease Maternal Grandfather   . Cancer Paternal Grandmother        colon    ROS: Review of Systems  Constitutional: Positive for weight loss.  Gastrointestinal: Negative for nausea and vomiting.  Musculoskeletal:       Negative muscle weakness    PHYSICAL EXAM: Blood pressure 119/72, pulse 60, temperature 97.9 F (36.6 C), temperature source Oral, height 5\' 3"  (1.6 m), weight 218 lb (98.9 kg), SpO2 97 %. Body mass index is 38.62 kg/m. Physical Exam  Constitutional: She is oriented to person, place, and time. She appears  well-developed and well-nourished.  Cardiovascular: Normal rate.  Pulmonary/Chest: Effort normal.  Musculoskeletal: Normal range of motion.  Neurological: She is oriented to person, place, and time.  Skin: Skin is warm and dry.  Psychiatric: She has a normal mood and affect. Her behavior is normal.  Vitals reviewed.   RECENT LABS AND TESTS: BMET    Component Value Date/Time   NA 139 08/26/2018 1055   K 4.7 08/26/2018 1055   CL 98 08/26/2018 1055   CO2 24 08/26/2018 1055   GLUCOSE 123 (H) 08/26/2018 1055   BUN 10 08/26/2018 1055   CREATININE 0.67 08/26/2018 1055   CALCIUM 8.9 08/26/2018 1055   GFRNONAA 95 08/26/2018 1055   GFRAA 110 08/26/2018 1055   Lab Results  Component Value Date   HGBA1C 6.3 (H) 08/26/2018   Lab Results  Component Value Date   INSULIN 14.9 08/26/2018   CBC    Component Value Date/Time   WBC 7.3 08/26/2018 1055   RBC 4.45 08/26/2018 1055   HGB 12.6 08/26/2018 1055   HCT 39.0 08/26/2018 1055   MCV 88 08/26/2018 1055   MCH 28.3 08/26/2018 1055   MCHC 32.3 08/26/2018 1055   RDW 13.1 08/26/2018 1055   LYMPHSABS 2.1 08/26/2018 1055   EOSABS 0.3 08/26/2018 1055   BASOSABS 0.0 08/26/2018 1055   Iron/TIBC/Ferritin/ %Sat No results found for: IRON, TIBC, FERRITIN, IRONPCTSAT Lipid Panel     Component Value Date/Time   CHOL 131 08/26/2018 1055   TRIG 91 08/26/2018 1055   HDL 42 08/26/2018 1055   LDLCALC 71 08/26/2018 1055   Hepatic Function Panel     Component Value Date/Time   PROT 7.4 08/26/2018 1055   ALBUMIN 4.3 08/26/2018 1055   AST 20 08/26/2018 1055   ALT 18 08/26/2018 1055   ALKPHOS 86 08/26/2018 1055   BILITOT 0.3 08/26/2018 1055      Component Value Date/Time   TSH 2.320 08/26/2018 1055  Results for ILEA, HILTON (MRN [id number]) as of 10/14/2018 16:32  Ref. Range 08/26/2018 10:55  Vitamin D, 25-Hydroxy Latest Ref Range: 30.0 - 100.0 ng/mL 19.2 (L)    ASSESSMENT AND PLAN: Vitamin D deficiency - Plan: Vitamin D,  Ergocalciferol, (DRISDOL) 50000 units CAPS capsule  At risk for osteoporosis  Class 2 severe obesity with serious comorbidity and body mass index (BMI) of 38.0 to 38.9 in adult, unspecified obesity type ([place])  PLAN:  Vitamin D Deficiency  Cynthia Short was informed that low vitamin D levels contributes to fatigue and are associated with obesity, breast, and colon cancer. Cynthia Short agrees to continue taking prescription Vit D @50 ,000 IU every week #4 and we will refill for 1 month. She will follow up for routine testing of vitamin D, at least 2-3 times per year. She was informed of the risk of over-replacement of vitamin D and agrees to not increase her dose unless she discusses this with [name] first. Cynthia Short agrees to follow up with our clinic in 3 weeks.  At risk for osteopenia and osteoporosis Cynthia Short was given extended (15 minutes) osteoporosis prevention counseling today. Cynthia Short is at risk for osteopenia and osteoporsis due to her vitamin D deficiency. She was encouraged to take her vitamin D and follow her higher calcium diet and increase strengthening exercise to help strengthen her bones and decrease her risk of osteopenia and osteoporosis.  Obesity Cynthia Short is currently in the action stage of change. As such, her goal is to continue with weight loss efforts She has agreed to keep a food journal with 400-550 calories and 40 grams of protein at supper daily and follow the Category 2 plan Cynthia Short has been instructed to work up to a goal of 150 minutes of combined cardio and strengthening exercise per week for weight loss and overall health benefits. We discussed the following Behavioral Modification Strategies today: increasing lean protein intake, decreasing simple carbohydrates, work on meal planning and easy cooking plans, holiday eating strategies, and emotional eating strategies   Cynthia Short has agreed to follow up with our clinic in 3 weeks. She was informed of the importance of frequent follow up visits to  maximize her success with intensive lifestyle modifications for her multiple health conditions.   OBESITY BEHAVIORAL INTERVENTION VISIT  Today's visit was # 4   Starting weight: 225 lbs Starting date: 08/26/18 Today's weight : 218 lbs  Today's date: 10/14/2018 Total lbs lost to date: 7    ASK: We discussed the diagnosis of obesity with Cynthia Short today and Cynthia Short agreed to give [name] permission to discuss obesity behavioral modification therapy today.  ASSESS: Cynthia Short has the diagnosis of obesity and her BMI today is 38.63 Cynthia Short is in the action stage of change   ADVISE: Cynthia Short was educated on the multiple health risks of obesity as well as the benefit of weight loss to improve her health. She was advised of the need for long term treatment and the importance of lifestyle modifications to improve her current health and to decrease her risk of future health problems.  AGREE: Multiple dietary modification options and treatment options were discussed and  Cynthia Short agreed to follow the recommendations documented in the above note.  ARRANGE: Cynthia Short was educated on the importance of frequent visits to treat obesity as outlined per CMS and USPSTF guidelines and agreed to schedule her next follow up appointment today.  I, Trixie Dredge, am acting as transcriptionist for Dennard Nip, MD  I have reviewed the above documentation for accuracy and completeness, and I agree with the above. -Dennard Nip, MD

## 2018-11-03 ENCOUNTER — Other Ambulatory Visit (INDEPENDENT_AMBULATORY_CARE_PROVIDER_SITE_OTHER): Payer: Self-pay | Admitting: Family Medicine

## 2018-11-03 DIAGNOSIS — E559 Vitamin D deficiency, unspecified: Secondary | ICD-10-CM

## 2018-11-04 ENCOUNTER — Ambulatory Visit (INDEPENDENT_AMBULATORY_CARE_PROVIDER_SITE_OTHER): Payer: BC Managed Care – PPO | Admitting: Family Medicine

## 2018-11-04 VITALS — BP 125/75 | HR 61 | Temp 97.9°F | Ht 63.0 in | Wt 219.0 lb

## 2018-11-04 DIAGNOSIS — E538 Deficiency of other specified B group vitamins: Secondary | ICD-10-CM | POA: Diagnosis not present

## 2018-11-04 DIAGNOSIS — E559 Vitamin D deficiency, unspecified: Secondary | ICD-10-CM

## 2018-11-04 DIAGNOSIS — Z9189 Other specified personal risk factors, not elsewhere classified: Secondary | ICD-10-CM | POA: Diagnosis not present

## 2018-11-04 DIAGNOSIS — Z6838 Body mass index (BMI) 38.0-38.9, adult: Secondary | ICD-10-CM

## 2018-11-04 MED ORDER — VITAMIN D (ERGOCALCIFEROL) 1.25 MG (50000 UNIT) PO CAPS: 50000.0000 [IU] | ORAL_CAPSULE | ORAL | 0 refills | Status: DC

## 2018-11-04 MED ORDER — VITAMIN B-12 1000 MCG PO TABS: 1000.0000 ug | ORAL_TABLET | Freq: Every day | ORAL | 0 refills | Status: DC

## 2018-11-10 NOTE — Progress Notes (Signed)
Office: [phone]  /  Fax: [phone]   HPI:   Chief Complaint: OBESITY Cynthia Short is here to discuss her progress with her obesity treatment plan. She is on the Category 1 plan and is following her eating plan approximately 50 % of the time. She states she is exercising 0 minutes 0 times per week. Modest has had increased stress this month and wasn't able to concentrate on her eating. She has done well with maintaining even with an increase in celebration eating.  Her weight is 219 lb (99.3 kg) today and has had a weight gain of 1 pound over a period of 3 weeks since her last visit. She has lost 6 lbs since starting treatment with [name].  Vitamin D deficiency Cynthia Short has a diagnosis of vitamin D deficiency. She is currently taking vit D and is stable. She denies nausea, vomiting, or muscle weakness.  At risk for osteopenia and osteoporosis Cynthia Short is at higher risk of osteopenia and osteoporosis due to vitamin D deficiency.   Vitamin B12 deficiency Cynthia Short has a diagnosis of B12 insufficiency and she is still taking B12 1,08mcg. She notes fatigue, but has been sick with an upper respiratory infection recently.   ALLERGIES: Allergies  Allergen Reactions  . Penicillins   . Trifluoperazine     MEDICATIONS: Current Outpatient Medications on File Prior to Visit  Medication Sig Dispense Refill  . aspirin 81 MG tablet Take 81 mg by mouth daily.    [name] atenolol (TENORMIN) 25 MG tablet TAKE 1 TABLET BY MOUTH ONCE DAILY 30 tablet 0  . estradiol (VIVELLE-DOT) 0.1 MG/24HR Place 1 patch onto the skin 2 (two) times a week.    . levothyroxine (SYNTHROID, LEVOTHROID) 75 MCG tablet Take 75 mcg by mouth daily.    [name] losartan (COZAAR) 50 MG tablet Take 50 mg by mouth daily.    . metFORMIN (GLUCOPHAGE-XR) 500 MG 24 hr tablet Take 1,000 mg by mouth daily with breakfast.     . Multiple Vitamins-Minerals (HAIR/SKIN/NAILS/BIOTIN PO) Take 1 capsule by mouth daily.    [name] omeprazole (PRILOSEC) 20 MG capsule Take 1  capsule by mouth daily.     . simvastatin (ZOCOR) 10 MG tablet Take 10 mg by mouth daily.    . SYMBICORT 80-4.5 MCG/ACT inhaler Inhale 2 puffs into the lungs Twice daily.     No current facility-administered medications on file prior to visit.     PAST MEDICAL HISTORY: Past Medical History:  Diagnosis Date  . Anemia   . Anxiety   . Arthritis   . Asthma   . Chest pain   . Constipation   . Diabetes mellitus   . Dyspnea   . Gallbladder problem   . GERD (gastroesophageal reflux disease)   . Hair loss   . Heart murmur   . Hiatal hernia   . Hip problem   . Hyperlipemia   . Hypertension   . IBS (irritable bowel syndrome)   . Joint pain   . Knee problem   . Leg edema   . Thyroid disease    hypothyroidism  . Tricuspid valve disorder     PAST SURGICAL HISTORY: Past Surgical History:  Procedure Laterality Date  . ABDOMINAL HYSTERECTOMY  3/98  . CHOLECYSTECTOMY  02/2006  . KNEE SURGERY    . MYOMECTOMY  12/93   uterine fibroids  . TMJ ARTHROPLASTY  8/82    SOCIAL HISTORY: Social History   Tobacco Use  . Smoking status: Never Smoker  . Smokeless tobacco:  Never Used  Substance Use Topics  . Alcohol use: No  . Drug use: No    FAMILY HISTORY: Family History  Problem Relation Age of Onset  . Cancer Father        lung  . Diabetes Father   . Heart disease Father   . Stroke Father   . Kidney disease Father   . Heart disease Maternal Uncle   . Cancer Maternal Grandmother        colon  . Heart disease Maternal Grandfather   . Cancer Paternal Grandmother        colon    ROS: Review of Systems  Constitutional: Positive for malaise/fatigue. Negative for weight loss.  Gastrointestinal: Negative for nausea and vomiting.  Musculoskeletal:       Negative for muscle weakness.    PHYSICAL EXAM: Blood pressure 125/75, pulse 61, temperature 97.9 F (36.6 C), temperature source Oral, height 5\' 3"  (1.6 m), weight 219 lb (99.3 kg), SpO2 99 %. Body mass index is 38.79  kg/m. Physical Exam  Constitutional: She is oriented to person, place, and time. She appears well-developed and well-nourished.  Cardiovascular: Normal rate.  Pulmonary/Chest: Effort normal.  Musculoskeletal: Normal range of motion.  Neurological: She is oriented to person, place, and time.  Skin: Skin is warm and dry.  Psychiatric: She has a normal mood and affect. Her behavior is normal.  Vitals reviewed.   RECENT LABS AND TESTS: BMET    Component Value Date/Time   NA 139 08/26/2018 1055   K 4.7 08/26/2018 1055   CL 98 08/26/2018 1055   CO2 24 08/26/2018 1055   GLUCOSE 123 (H) 08/26/2018 1055   BUN 10 08/26/2018 1055   CREATININE 0.67 08/26/2018 1055   CALCIUM 8.9 08/26/2018 1055   GFRNONAA 95 08/26/2018 1055   GFRAA 110 08/26/2018 1055   Lab Results  Component Value Date   HGBA1C 6.3 (H) 08/26/2018   Lab Results  Component Value Date   INSULIN 14.9 08/26/2018   CBC    Component Value Date/Time   WBC 7.3 08/26/2018 1055   RBC 4.45 08/26/2018 1055   HGB 12.6 08/26/2018 1055   HCT 39.0 08/26/2018 1055   MCV 88 08/26/2018 1055   MCH 28.3 08/26/2018 1055   MCHC 32.3 08/26/2018 1055   RDW 13.1 08/26/2018 1055   LYMPHSABS 2.1 08/26/2018 1055   EOSABS 0.3 08/26/2018 1055   BASOSABS 0.0 08/26/2018 1055   Iron/TIBC/Ferritin/ %Sat No results found for: IRON, TIBC, FERRITIN, IRONPCTSAT Lipid Panel     Component Value Date/Time   CHOL 131 08/26/2018 1055   TRIG 91 08/26/2018 1055   HDL 42 08/26/2018 1055   LDLCALC 71 08/26/2018 1055   Hepatic Function Panel     Component Value Date/Time   PROT 7.4 08/26/2018 1055   ALBUMIN 4.3 08/26/2018 1055   AST 20 08/26/2018 1055   ALT 18 08/26/2018 1055   ALKPHOS 86 08/26/2018 1055   BILITOT 0.3 08/26/2018 1055      Component Value Date/Time   TSH 2.320 08/26/2018 1055   Results for BRITNI, DRISCOLL (MRN [id number]) as of 11/10/2018 08:18  Ref. Range 08/26/2018 10:55  Vitamin D, 25-Hydroxy Latest Ref Range: 30.0 -  100.0 ng/mL 19.2 (L)   ASSESSMENT AND PLAN: Vitamin D deficiency - Plan: Vitamin D, Ergocalciferol, (DRISDOL) 1.25 MG (50000 UT) CAPS capsule  B12 deficiency - Plan: vitamin B-12 (CYANOCOBALAMIN) 1000 MCG tablet  At risk for osteoporosis  Class 2 severe obesity with serious comorbidity and  body mass index (BMI) of 38.0 to 38.9 in adult, unspecified obesity type ([place])  PLAN:  Vitamin D Deficiency Alora was informed that low vitamin D levels contributes to fatigue and are associated with obesity, breast, and colon cancer. She agrees to continue to take prescription Vit D @50 ,000 IU every week #4 with no refills and will follow up for routine testing of vitamin D, at least 2-3 times per year. She was informed of the risk of over-replacement of vitamin D and agrees to not increase her dose unless she discusses this with [name] first. We will order labs at her next visit and she agrees to follow up in 3 weeks.  At risk for osteopenia and osteoporosis Grant was given extended (15 minutes) osteoporosis prevention counseling today. Elnora is at risk for osteopenia and osteoporosis due to her vitamin D deficiency. She was encouraged to take her vitamin D and follow her higher calcium diet and increase strengthening exercise to help strengthen her bones and decrease her risk of osteopenia and osteoporosis.  Vitamin B12 deficiency Eleny agrees to continue taking B12 1,067mcg qd #30 with no refills and to follow up with [name] as agreed in 3 weeks.  Obesity Misao is currently in the action stage of change. As such, her goal is to continue with weight loss efforts. She has agreed to follow the Category 1 plan. Kahmari has been instructed to work up to a goal of 150 minutes of combined cardio and strengthening exercise per week for weight loss and overall health benefits. We discussed the following Behavioral Modification Strategies today: increasing lean protein intake, decreasing simple carbohydrates, work on  meal planning and easy cooking plans, and holiday eating strategies.   Kisha has agreed to follow up with our clinic in 3 weeks for a fasting appointment. She was informed of the importance of frequent follow up visits to maximize her success with intensive lifestyle modifications for her multiple health conditions.   OBESITY BEHAVIORAL INTERVENTION VISIT  Today's visit was # 5   Starting weight: 225 lbs Starting date: 08/26/18 Today's weight : Weight: 219 lb (99.3 kg)  Today's date: 11/04/2018 Total lbs lost to date: 6  ASK: We discussed the diagnosis of obesity with [name] today and Jadamarie agreed to give [name] permission to discuss obesity behavioral modification therapy today.  ASSESS: Kollins has the diagnosis of obesity and her BMI today is 38.8. Rheba is in the action stage of change.  ADVISE: Marcellene was educated on the multiple health risks of obesity as well as the benefit of weight loss to improve her health. She was advised of the need for long term treatment and the importance of lifestyle modifications to improve her current health and to decrease her risk of future health problems.  AGREE: Multiple dietary modification options and treatment options were discussed and Tifanny agreed to follow the recommendations documented in the above note.  ARRANGE: Xitlaly was educated on the importance of frequent visits to treat obesity as outlined per CMS and USPSTF guidelines and agreed to schedule her next follow up appointment today.  I, Marcille Blanco, am acting as transcriptionist for Starlyn Skeans, MD  I have reviewed the above documentation for accuracy and completeness, and I agree with the above. -Dennard Nip, MD

## 2018-11-16 ENCOUNTER — Encounter (INDEPENDENT_AMBULATORY_CARE_PROVIDER_SITE_OTHER): Payer: Self-pay | Admitting: Family Medicine

## 2018-11-16 ENCOUNTER — Ambulatory Visit: Payer: BC Managed Care – PPO | Admitting: Podiatry

## 2018-11-16 DIAGNOSIS — L603 Nail dystrophy: Secondary | ICD-10-CM | POA: Diagnosis not present

## 2018-11-16 DIAGNOSIS — B351 Tinea unguium: Secondary | ICD-10-CM | POA: Diagnosis not present

## 2018-11-18 NOTE — Progress Notes (Signed)
Subjective: 62 year old female presents the office today for follow-up evaluation of toenail fungus and thickening of the toenail.  She says that overall she is doing better as far as the color to the toenail.  She still gets some occasional redness and swelling to the toenail site and is on the medial aspect but denies any drainage or pus.  She denies any red streaks.  She has no other concerns today.  No other changes. Denies any systemic complaints such as fevers, chills, nausea, vomiting. No acute changes since last appointment, and no other complaints at this time.   Objective: AAO x3, NAD DP/PT pulses palpable bilaterally, CRT less than 3 seconds Right hallux toenail appears to improved in color and appears to be more normal in nature.  There is incurvation along the medial aspect of the toenail today there is no edema, erythema.  She did show me a picture of some localized erythema to the area but appears to be more inflammation as opposed to infection and there is no clinical signs of infection noted today. No open lesions or pre-ulcerative lesions. No pain with calf compression, swelling, warmth, erythema  Assessment: Onychodystrophy/onychomycosis right hallux toenail  Plan: -All treatment options discussed with the patient including all alternatives, risks, complications.  -Overall the appearance of the toenails much improved.  Portion she has a recurrent ingrown toenail on the medial aspect.  We discussed partial nail avulsion with chemical matricectomy.  She wishes to hold off on this.  We will continue to monitor but if there is any signs or symptoms of infection or increased pain she will need to have partial nail avulsion.  We can discuss bone spur resection as well.  Trula Slade DPM

## 2018-11-19 ENCOUNTER — Ambulatory Visit
Admission: RE | Admit: 2018-11-19 | Discharge: 2018-11-19 | Disposition: A | Discharge status: Home / Self Care | Payer: BC Managed Care – PPO | Source: Ambulatory Visit | Attending: Obstetrics & Gynecology | Admitting: Obstetrics & Gynecology

## 2018-11-19 DIAGNOSIS — Z1231 Encounter for screening mammogram for malignant neoplasm of breast: Secondary | ICD-10-CM

## 2018-11-23 ENCOUNTER — Ambulatory Visit (INDEPENDENT_AMBULATORY_CARE_PROVIDER_SITE_OTHER): Payer: BC Managed Care – PPO | Admitting: Family Medicine

## 2018-11-23 ENCOUNTER — Encounter (INDEPENDENT_AMBULATORY_CARE_PROVIDER_SITE_OTHER): Payer: Self-pay | Admitting: Family Medicine

## 2018-11-23 VITALS — BP 122/74 | HR 66 | Temp 97.5°F | Ht 63.0 in | Wt 216.0 lb

## 2018-11-23 DIAGNOSIS — E559 Vitamin D deficiency, unspecified: Secondary | ICD-10-CM

## 2018-11-23 DIAGNOSIS — E119 Type 2 diabetes mellitus without complications: Secondary | ICD-10-CM | POA: Diagnosis not present

## 2018-11-23 DIAGNOSIS — E038 Other specified hypothyroidism: Secondary | ICD-10-CM | POA: Diagnosis not present

## 2018-11-23 DIAGNOSIS — Z6838 Body mass index (BMI) 38.0-38.9, adult: Secondary | ICD-10-CM

## 2018-11-23 MED ORDER — VITAMIN D (ERGOCALCIFEROL) 1.25 MG (50000 UNIT) PO CAPS: 50000.0000 [IU] | ORAL_CAPSULE | ORAL | 0 refills | Status: DC

## 2018-11-23 NOTE — Progress Notes (Signed)
Office: [phone]  /  Fax: [phone]   HPI:   Chief Complaint: OBESITY Cynthia Short is here to discuss her progress with her obesity treatment plan. She is on the Category 1 plan and is following her eating plan approximately 65-70 % of the time. She states she is walking for 20 minutes 2-3 times per week. Cynthia Short continues to do very well on Category 1 plan. Her hunger is controlled and she is mostly getting good support from her husband. She notes decreased PM snacking and not feeling deprived.  Her weight is 216 lb (98 kg) today and has had a weight loss of 3 pounds over a period of 2 to 3 weeks since her last visit. She has lost 9 lbs since starting treatment with [name].  Vitamin D Deficiency Cynthia Short has a diagnosis of vitamin D deficiency. She is stable on prescription Vit D and denies nausea, vomiting or muscle weakness.  Diabetes II Cynthia Short has a diagnosis of diabetes type II. Cynthia Short is doing well on diet and metformin and she notes decreased polyphagia. She is due for labs. Last A1c was 6.3. She has been working on intensive lifestyle modifications including diet, exercise, and weight loss to help control her blood glucose levels.  Hypothyroidism Cynthia Short has a diagnosis of hypothyroidism. She is stable on levothyroxine, but she is at risk of becoming over-replaced while losing weight. She denies hot or cold intolerance or palpitations.  ALLERGIES: Allergies  Allergen Reactions  . Penicillins   . Trifluoperazine     MEDICATIONS: Current Outpatient Medications on File Prior to Visit  Medication Sig Dispense Refill  . aspirin 81 MG tablet Take 81 mg by mouth daily.    [name] atenolol (TENORMIN) 25 MG tablet TAKE 1 TABLET BY MOUTH ONCE DAILY 30 tablet 0  . estradiol (VIVELLE-DOT) 0.1 MG/24HR Place 1 patch onto the skin 2 (two) times a week.    . levothyroxine (SYNTHROID, LEVOTHROID) 75 MCG tablet Take 75 mcg by mouth daily.    [name] losartan (COZAAR) 50 MG tablet Take 50 mg by mouth daily.    .  metFORMIN (GLUCOPHAGE-XR) 500 MG 24 hr tablet Take 1,000 mg by mouth daily with breakfast.     . Multiple Vitamins-Minerals (HAIR/SKIN/NAILS/BIOTIN PO) Take 1 capsule by mouth daily.    [name] omeprazole (PRILOSEC) 20 MG capsule Take 1 capsule by mouth daily.     . simvastatin (ZOCOR) 10 MG tablet Take 10 mg by mouth daily.    . SYMBICORT 80-4.5 MCG/ACT inhaler Inhale 2 puffs into the lungs Twice daily.    . vitamin B-12 (CYANOCOBALAMIN) 1000 MCG tablet Take 1 tablet (1,000 mcg total) by mouth daily. 30 tablet 0   No current facility-administered medications on file prior to visit.     PAST MEDICAL HISTORY: Past Medical History:  Diagnosis Date  . Anemia   . Anxiety   . Arthritis   . Asthma   . Chest pain   . Constipation   . Diabetes mellitus   . Dyspnea   . Gallbladder problem   . GERD (gastroesophageal reflux disease)   . Hair loss   . Heart murmur   . Hiatal hernia   . Hip problem   . Hyperlipemia   . Hypertension   . IBS (irritable bowel syndrome)   . Joint pain   . Knee problem   . Leg edema   . Thyroid disease    hypothyroidism  . Tricuspid valve disorder     PAST SURGICAL HISTORY: Past Surgical  History:  Procedure Laterality Date  . ABDOMINAL HYSTERECTOMY  3/98  . CHOLECYSTECTOMY  02/2006  . KNEE SURGERY    . MYOMECTOMY  12/93   uterine fibroids  . TMJ ARTHROPLASTY  8/82    SOCIAL HISTORY: Social History   Tobacco Use  . Smoking status: Never Smoker  . Smokeless tobacco: Never Used  Substance Use Topics  . Alcohol use: No  . Drug use: No    FAMILY HISTORY: Family History  Problem Relation Age of Onset  . Cancer Father        lung  . Diabetes Father   . Heart disease Father   . Stroke Father   . Kidney disease Father   . Heart disease Maternal Uncle   . Cancer Maternal Grandmother        colon  . Heart disease Maternal Grandfather   . Cancer Paternal Grandmother        colon    ROS: Review of Systems  Constitutional: Positive for weight  loss.  Cardiovascular: Negative for palpitations.  Gastrointestinal: Negative for nausea and vomiting.  Musculoskeletal:       Negative muscle weakness  Endo/Heme/Allergies:       Positive polyphagia Negative hypoglycemia Negative hot/cold intolerance    PHYSICAL EXAM: Blood pressure 122/74, pulse 66, temperature (!) 97.5 F (36.4 C), temperature source Oral, height 5\' 3"  (1.6 m), weight 216 lb (98 kg), SpO2 97 %. Body mass index is 38.26 kg/m. Physical Exam  Constitutional: She is oriented to person, place, and time. She appears well-developed and well-nourished.  Cardiovascular: Normal rate.  Pulmonary/Chest: Effort normal.  Musculoskeletal: Normal range of motion.  Neurological: She is oriented to person, place, and time.  Skin: Skin is warm and dry.  Psychiatric: She has a normal mood and affect. Her behavior is normal.  Vitals reviewed.   RECENT LABS AND TESTS: BMET    Component Value Date/Time   NA 139 08/26/2018 1055   K 4.7 08/26/2018 1055   CL 98 08/26/2018 1055   CO2 24 08/26/2018 1055   GLUCOSE 123 (H) 08/26/2018 1055   BUN 10 08/26/2018 1055   CREATININE 0.67 08/26/2018 1055   CALCIUM 8.9 08/26/2018 1055   GFRNONAA 95 08/26/2018 1055   GFRAA 110 08/26/2018 1055   Lab Results  Component Value Date   HGBA1C 6.3 (H) 08/26/2018   Lab Results  Component Value Date   INSULIN 14.9 08/26/2018   CBC    Component Value Date/Time   WBC 7.3 08/26/2018 1055   RBC 4.45 08/26/2018 1055   HGB 12.6 08/26/2018 1055   HCT 39.0 08/26/2018 1055   MCV 88 08/26/2018 1055   MCH 28.3 08/26/2018 1055   MCHC 32.3 08/26/2018 1055   RDW 13.1 08/26/2018 1055   LYMPHSABS 2.1 08/26/2018 1055   EOSABS 0.3 08/26/2018 1055   BASOSABS 0.0 08/26/2018 1055   Iron/TIBC/Ferritin/ %Sat No results found for: IRON, TIBC, FERRITIN, IRONPCTSAT Lipid Panel     Component Value Date/Time   CHOL 131 08/26/2018 1055   TRIG 91 08/26/2018 1055   HDL 42 08/26/2018 1055   LDLCALC 71  08/26/2018 1055   Hepatic Function Panel     Component Value Date/Time   PROT 7.4 08/26/2018 1055   ALBUMIN 4.3 08/26/2018 1055   AST 20 08/26/2018 1055   ALT 18 08/26/2018 1055   ALKPHOS 86 08/26/2018 1055   BILITOT 0.3 08/26/2018 1055      Component Value Date/Time   TSH 2.320 08/26/2018 1055  Results for CANDAS, DEEMER (MRN [id number]) as of 11/23/2018 16:54  Ref. Range 08/26/2018 10:55  Vitamin D, 25-Hydroxy Latest Ref Range: 30.0 - 100.0 ng/mL 19.2 (L)   ASSESSMENT AND PLAN: Vitamin D deficiency - Plan: VITAMIN D 25 Hydroxy (Vit-D Deficiency, Fractures), Vitamin D, Ergocalciferol, (DRISDOL) 1.25 MG (50000 UT) CAPS capsule  Type 2 diabetes mellitus without complication, without long-term current use of insulin (H[facility]) - Plan: Insulin, random, Hemoglobin A1c, Comprehensive metabolic panel  Other specified hypothyroidism - Plan: T3, T4, free, TSH  Class 2 severe obesity with serious comorbidity and body mass index (BMI) of 38.0 to 38.9 in adult, unspecified obesity type ([facility])  PLAN:  Vitamin D Deficiency Cynthia Short was informed that low vitamin D levels contributes to fatigue and are associated with obesity, breast, and colon cancer. Cynthia Short agrees to continue taking prescription Vit D @50 ,000 IU every week #4 and we will refill for 1 month. She will follow up for routine testing of vitamin D, at least 2-3 times per year. She was informed of the risk of over-replacement of vitamin D and agrees to not increase her dose unless she discusses this with [name] first. We will check labs and Cynthia Short agrees to follow up with our clinic in 3 to 4 weeks.  Diabetes II Cynthia Short has been given extensive diabetes education by myself today including ideal fasting and post-prandial blood glucose readings, individual ideal Hgb A1c goals and hypoglycemia prevention. We discussed the importance of good blood sugar control to decrease the likelihood of diabetic complications such as nephropathy, neuropathy, limb  loss, blindness, coronary artery disease, and death. We discussed the importance of intensive lifestyle modification including diet, exercise and weight loss as the first line treatment for diabetes. Delcia agrees to continue taking metformin and we will check labs. Cynthia Short agrees to follow up with our clinic in 3 to 4 weeks.  Hypothyroidism Cynthia Short was informed of the importance of good thyroid control to help with weight loss efforts. She was also informed that supertheraputic thyroid levels are dangerous and will not improve weight loss results. We will check labs and Cynthia Short agrees to continue taking levothyroxine as is. Cynthia Short agrees to follow up with our clinic in 3 to 4 weeks.   Obesity Cynthia Short is currently in the action stage of change. As such, her goal is to continue with weight loss efforts She has agreed to follow the Category 1 plan Cynthia Short has been instructed to work up to a goal of 150 minutes of combined cardio and strengthening exercise per week for weight loss and overall health benefits. We discussed the following Behavioral Modification Strategies today: increasing lean protein intake, decreasing simple carbohydrates, work on meal planning and easy cooking plans, no skipping meals, holiday eating strategies, and celebration eating strategies   Cynthia Short has agreed to follow up with our clinic in 3 to 4 weeks. She was informed of the importance of frequent follow up visits to maximize her success with intensive lifestyle modifications for her multiple health conditions.   OBESITY BEHAVIORAL INTERVENTION VISIT  Today's visit was # 7   Starting weight: 225 lbs Starting date: 08/26/18 Today's weight : 216 lbs Today's date: 11/23/2018 Total lbs lost to date: 9    ASK: We discussed the diagnosis of obesity with Cynthia Short today and Cynthia Short agreed to give [name] permission to discuss obesity behavioral modification therapy today.  ASSESS: Cynthia Short has the diagnosis of obesity and her BMI  today is 38.27 Cynthia Short is in the action stage of  change   ADVISE: Cynthia Short was educated on the multiple health risks of obesity as well as the benefit of weight loss to improve her health. She was advised of the need for long term treatment and the importance of lifestyle modifications to improve her current health and to decrease her risk of future health problems.  AGREE: Multiple dietary modification options and treatment options were discussed and  Cynthia Short agreed to follow the recommendations documented in the above note.  ARRANGE: Cynthia Short was educated on the importance of frequent visits to treat obesity as outlined per CMS and USPSTF guidelines and agreed to schedule her next follow up appointment today.  I, Trixie Dredge, am acting as transcriptionist for Dennard Nip, MD  I have reviewed the above documentation for accuracy and completeness, and I agree with the above. -Dennard Nip, MD

## 2018-11-24 LAB — COMPREHENSIVE METABOLIC PANEL
ALT: 16 IU/L (ref 0–32)
AST: 16 IU/L (ref 0–40)
Albumin/Globulin Ratio: 1.4 (ref 1.2–2.2)
Albumin: 4.2 g/dL (ref 3.6–4.8)
Alkaline Phosphatase: 80 IU/L (ref 39–117)
BUN / CREAT RATIO: 14 (ref 12–28)
BUN: 11 mg/dL (ref 8–27)
Bilirubin Total: 0.3 mg/dL (ref 0.0–1.2)
CHLORIDE: 98 mmol/L (ref 96–106)
CO2: 23 mmol/L (ref 20–29)
Calcium: 9.1 mg/dL (ref 8.7–10.3)
Creatinine, Ser: 0.77 mg/dL (ref 0.57–1.00)
GFR calc Af Amer: 96 mL/min/{1.73_m2} (ref >59)
GFR calc non Af Amer: 83 mL/min/{1.73_m2} (ref >59)
Globulin, Total: 3.1 g/dL (ref 1.5–4.5)
Glucose: 110 mg/dL — ABNORMAL HIGH (ref 65–99)
Potassium: 4.6 mmol/L (ref 3.5–5.2)
Sodium: 138 mmol/L (ref 134–144)
Total Protein: 7.3 g/dL (ref 6.0–8.5)

## 2018-11-24 LAB — T3: T3, Total: 128 ng/dL (ref 71–180)

## 2018-11-24 LAB — VITAMIN D 25 HYDROXY (VIT D DEFICIENCY, FRACTURES): Vit D, 25-Hydroxy: 24.8 ng/mL — ABNORMAL LOW (ref 30.0–100.0)

## 2018-11-24 LAB — HEMOGLOBIN A1C
Est. average glucose Bld gHb Est-mCnc: 137 mg/dL
Hgb A1c MFr Bld: 6.4 % — ABNORMAL HIGH (ref 4.8–5.6)

## 2018-11-24 LAB — T4, FREE: Free T4: 1.58 ng/dL (ref 0.82–1.77)

## 2018-11-24 LAB — INSULIN, RANDOM: INSULIN: 24.9 u[IU]/mL (ref 2.6–24.9)

## 2018-11-24 LAB — TSH: TSH: 1.74 u[IU]/mL (ref 0.450–4.500)

## 2018-12-21 ENCOUNTER — Ambulatory Visit (INDEPENDENT_AMBULATORY_CARE_PROVIDER_SITE_OTHER): Payer: BC Managed Care – PPO | Admitting: Family Medicine

## 2018-12-21 ENCOUNTER — Encounter (INDEPENDENT_AMBULATORY_CARE_PROVIDER_SITE_OTHER): Payer: Self-pay | Admitting: Family Medicine

## 2018-12-21 VITALS — BP 130/65 | HR 64 | Temp 98.0°F | Ht 63.0 in | Wt 218.0 lb

## 2018-12-21 DIAGNOSIS — Z9189 Other specified personal risk factors, not elsewhere classified: Secondary | ICD-10-CM | POA: Diagnosis not present

## 2018-12-21 DIAGNOSIS — E119 Type 2 diabetes mellitus without complications: Secondary | ICD-10-CM | POA: Diagnosis not present

## 2018-12-21 DIAGNOSIS — E538 Deficiency of other specified B group vitamins: Secondary | ICD-10-CM | POA: Diagnosis not present

## 2018-12-21 DIAGNOSIS — E559 Vitamin D deficiency, unspecified: Secondary | ICD-10-CM | POA: Diagnosis not present

## 2018-12-21 DIAGNOSIS — Z6838 Body mass index (BMI) 38.0-38.9, adult: Secondary | ICD-10-CM

## 2018-12-21 MED ORDER — VITAMIN B-12 1000 MCG PO TABS: 1000.0000 ug | ORAL_TABLET | Freq: Every day | ORAL | 0 refills | Status: DC

## 2018-12-21 MED ORDER — METFORMIN HCL ER 500 MG PO TB24: 500.0000 mg | ORAL_TABLET | ORAL | 0 refills | Status: DC

## 2018-12-21 NOTE — Progress Notes (Addendum)
Office: [phone]  /  Fax: [phone]   HPI:   Chief Complaint: OBESITY Cynthia Short is here to discuss her progress with her obesity treatment plan. She is on the Category 1 plan and is following her eating plan approximately 50 % of the time. She states she is exercising 0 minutes 0 times per week. Cynthia Short did well minimizing weight gain over the holidays. She did indulge and eat out more, but is ready to get back on track.  Her weight is 218 lb (98.9 kg) today and has had a weight gain of 2 pounds over a period of 4 weeks since her last visit. She has lost 7 lbs since starting treatment with [name].  Vitamin B12 Deficiency Cynthia Short has a diagnosis of B12 insufficiency. This is not a new diagnosis. Cynthia Short is stable on vitamin B12 and she was on increased B12 with her diet as well. She is not a vegetarian and does have a previous diagnosis of anemia. She does not have a history of weight loss surgery.   Vitamin D deficiency Cynthia Short has a diagnosis of vitamin D deficiency. She is currently taking vit D and denies nausea, vomiting or muscle weakness.  Diabetes II Cynthia Short has a diagnosis of diabetes type II. Cynthia Short's last A1c was 6.4 on 11/23/18 and was slightly worse over the holidays. She also notes some increased polyphagia. She has been working on intensive lifestyle modifications including diet, exercise, and weight loss to help control her blood glucose levels.  At risk for cardiovascular disease Cynthia Short is at a higher than average risk for cardiovascular disease due to diabetes and obesity. She currently denies any chest pain.  ASSESSMENT AND PLAN:  B12 nutritional deficiency - Plan: vitamin B-12 (CYANOCOBALAMIN) 1000 MCG tablet  Type 2 diabetes mellitus without complication, without long-term current use of insulin (HCC) - Plan: metFORMIN (GLUCOPHAGE-XR) 500 MG 24 hr tablet  At risk for heart disease  Class 2 severe obesity with serious comorbidity and body mass index (BMI) of 38.0 to 38.9 in  adult, unspecified obesity type (Cynthia Short)  PLAN:  Vitamin B12 Deficiency Cynthia Short will work on increasing B12 rich foods in her diet. Vitamin B12 1,032mcg qd #30 with no refills was prescribed today. She agrees to follow up in 2 weeks.  Vitamin D Deficiency Cynthia Short was informed that low vitamin D levels contributes to fatigue and are associated with obesity, breast, and colon cancer. She agrees to continue to take prescription Vit D @50 ,000 IU every week and will follow up for routine testing of vitamin D, at least 2-3 times per year. She was informed of the risk of over-replacement of vitamin D and agrees to not increase her dose unless she discusses this with [name] first. RF vit D x 1 month today.  Diabetes II Cynthia Short has been given extensive diabetes education by myself today including ideal fasting and post-prandial blood glucose readings, individual ideal Hgb A1c goals, and hypoglycemia prevention. We discussed the importance of good blood sugar control to decrease the likelihood of diabetic complications such as nephropathy, neuropathy, limb loss, blindness, coronary artery disease, and death. We discussed the importance of intensive lifestyle modification including diet, exercise and weight loss as the first line treatment for diabetes. Cynthia Short agrees to increase her metformin XR to 500mg , 3 tablets PO qd #90 with no refills and to continue her diet. She will follow up at the agreed upon time.  Cardiovascular risk counselling Cynthia Short was given extended (15 minutes) coronary artery disease prevention counseling today. She is  63 y.o. female and has risk factors for heart disease including diabetes and obesity. We discussed intensive lifestyle modifications today with an emphasis on specific weight loss instructions and strategies. Pt was also informed of the importance of increasing exercise and decreasing saturated fats to help prevent heart disease.  Obesity Cynthia Short is currently in the action stage of  change. As such, her goal is to continue with weight loss efforts. She has agreed to follow the Category 1 plan or the Pescatarian plan. Cynthia Short has been instructed to work up to a goal of 150 minutes of combined cardio and strengthening exercise per week for weight loss and overall health benefits. We discussed the following Behavioral Modification Stratagies today: increasing lean protein intake, decreasing simple carbohydrates, and no skipping meals.   Cynthia Short has agreed to follow up with our clinic in 2 weeks. She was informed of the importance of frequent follow up visits to maximize her success with intensive lifestyle modifications for her multiple health conditions.  ALLERGIES: Allergies  Allergen Reactions  . Penicillins   . Trifluoperazine     MEDICATIONS: Current Outpatient Medications on File Prior to Visit  Medication Sig Dispense Refill  . aspirin 81 MG tablet Take 81 mg by mouth daily.    [name] atenolol (TENORMIN) 25 MG tablet TAKE 1 TABLET BY MOUTH ONCE DAILY 30 tablet 0  . estradiol (VIVELLE-DOT) 0.1 MG/24HR Place 1 patch onto the skin 2 (two) times a week.    . levothyroxine (SYNTHROID, LEVOTHROID) 75 MCG tablet Take 75 mcg by mouth daily.    [name] losartan (COZAAR) 50 MG tablet Take 50 mg by mouth daily.    . Multiple Vitamins-Minerals (HAIR/SKIN/NAILS/BIOTIN PO) Take 1 capsule by mouth daily.    [name] omeprazole (PRILOSEC) 20 MG capsule Take 1 capsule by mouth daily.     . simvastatin (ZOCOR) 10 MG tablet Take 10 mg by mouth daily.    . SYMBICORT 80-4.5 MCG/ACT inhaler Inhale 2 puffs into the lungs Twice daily.    . Vitamin D, Ergocalciferol, (DRISDOL) 1.25 MG (50000 UT) CAPS capsule Take 1 capsule (50,000 Units total) by mouth every 7 (seven) days. 4 capsule 0   No current facility-administered medications on file prior to visit.     PAST MEDICAL HISTORY: Past Medical History:  Diagnosis Date  . Anemia   . Anxiety   . Arthritis   . Asthma   . Chest pain   . Constipation    . Diabetes mellitus   . Dyspnea   . Gallbladder problem   . GERD (gastroesophageal reflux disease)   . Hair loss   . Heart murmur   . Hiatal hernia   . Hip problem   . Hyperlipemia   . Hypertension   . IBS (irritable bowel syndrome)   . Joint pain   . Knee problem   . Leg edema   . Thyroid disease    hypothyroidism  . Tricuspid valve disorder     PAST SURGICAL HISTORY: Past Surgical History:  Procedure Laterality Date  . ABDOMINAL HYSTERECTOMY  3/98  . CHOLECYSTECTOMY  02/2006  . KNEE SURGERY    . MYOMECTOMY  12/93   uterine fibroids  . TMJ ARTHROPLASTY  8/82    SOCIAL HISTORY: Social History   Tobacco Use  . Smoking status: Never Smoker  . Smokeless tobacco: Never Used  Substance Use Topics  . Alcohol use: No  . Drug use: No    FAMILY HISTORY: Family History  Problem Relation Age of Onset  .  Cancer Father        lung  . Diabetes Father   . Heart disease Father   . Stroke Father   . Kidney disease Father   . Heart disease Maternal Uncle   . Cancer Maternal Grandmother        colon  . Heart disease Maternal Grandfather   . Cancer Paternal Grandmother        colon    ROS: Review of Systems  Constitutional: Negative for weight loss.  Gastrointestinal: Negative for nausea and vomiting.  Musculoskeletal:       Negative for muscle weakness.  Endo/Heme/Allergies:       Positive for polyphagia.    PHYSICAL EXAM: Blood pressure 130/65, pulse 64, temperature 98 F (36.7 C), temperature source Oral, height 5\' 3"  (1.6 m), weight 218 lb (98.9 kg), SpO2 96 %. Body mass index is 38.62 kg/m. Physical Exam Vitals signs reviewed.  Constitutional:      Appearance: Normal appearance. She is obese.  Cardiovascular:     Rate and Rhythm: Normal rate.  Pulmonary:     Effort: Pulmonary effort is normal.  Musculoskeletal: Normal range of motion.  Skin:    General: Skin is warm and dry.  Neurological:     Mental Status: She is alert and oriented to person,  place, and time.  Psychiatric:        Mood and Affect: Mood normal.        Behavior: Behavior normal.     RECENT LABS AND TESTS: BMET    Component Value Date/Time   NA 138 11/23/2018 0947   K 4.6 11/23/2018 0947   CL 98 11/23/2018 0947   CO2 23 11/23/2018 0947   GLUCOSE 110 (H) 11/23/2018 0947   BUN 11 11/23/2018 0947   CREATININE 0.77 11/23/2018 0947   CALCIUM 9.1 11/23/2018 0947   GFRNONAA 83 11/23/2018 0947   GFRAA 96 11/23/2018 0947   Lab Results  Component Value Date   HGBA1C 6.4 (H) 11/23/2018   HGBA1C 6.3 (H) 08/26/2018   Lab Results  Component Value Date   INSULIN 24.9 11/23/2018   INSULIN 14.9 08/26/2018   CBC    Component Value Date/Time   WBC 7.3 08/26/2018 1055   RBC 4.45 08/26/2018 1055   HGB 12.6 08/26/2018 1055   HCT 39.0 08/26/2018 1055   MCV 88 08/26/2018 1055   MCH 28.3 08/26/2018 1055   MCHC 32.3 08/26/2018 1055   RDW 13.1 08/26/2018 1055   LYMPHSABS 2.1 08/26/2018 1055   EOSABS 0.3 08/26/2018 1055   BASOSABS 0.0 08/26/2018 1055   Iron/TIBC/Ferritin/ %Sat No results found for: IRON, TIBC, FERRITIN, IRONPCTSAT Lipid Panel     Component Value Date/Time   CHOL 131 08/26/2018 1055   TRIG 91 08/26/2018 1055   HDL 42 08/26/2018 1055   LDLCALC 71 08/26/2018 1055   Hepatic Function Panel     Component Value Date/Time   PROT 7.3 11/23/2018 0947   ALBUMIN 4.2 11/23/2018 0947   AST 16 11/23/2018 0947   ALT 16 11/23/2018 0947   ALKPHOS 80 11/23/2018 0947   BILITOT 0.3 11/23/2018 0947      Component Value Date/Time   TSH 1.740 11/23/2018 0947   TSH 2.320 08/26/2018 1055   Results for [name], BOLEN (MRN [id number]) as of 12/22/2018 08:47  Ref. Range 11/23/2018 09:47  Vitamin D, 25-Hydroxy Latest Ref Range: 30.0 - 100.0 ng/mL 24.8 (L)    OBESITY BEHAVIORAL INTERVENTION VISIT  Today's visit was # 7  Starting weight: 225 lbs Starting date: 08/26/18 Today's weight : Weight: 218 lb (98.9 kg)  Today's date: 12/21/2018 Total lbs lost  to date: 7  ASK: We discussed the diagnosis of obesity with Cynthia Short today and Cynthia Short agreed to give [name] permission to discuss obesity behavioral modification therapy today.  ASSESS: Zya has the diagnosis of obesity and her BMI today is 38.6. Nashiya is in the action stage of change.   ADVISE: Lajune was educated on the multiple health risks of obesity as well as the benefit of weight loss to improve her health. She was advised of the need for long term treatment and the importance of lifestyle modifications to improve her current health and to decrease her risk of future health problems.  AGREE: Multiple dietary modification options and treatment options were discussed and [place] agreed to follow the recommendations documented in the above note.  ARRANGE: Yolanda was educated on the importance of frequent visits to treat obesity as outlined per CMS and USPSTF guidelines and agreed to schedule her next follow up appointment today.  I, Marcille Blanco, am acting as transcriptionist for Starlyn Skeans, MD  I have reviewed the above documentation for accuracy and completeness, and I agree with the above. -Dennard Nip, MD

## 2018-12-22 ENCOUNTER — Other Ambulatory Visit (INDEPENDENT_AMBULATORY_CARE_PROVIDER_SITE_OTHER): Payer: Self-pay | Admitting: Family Medicine

## 2018-12-22 DIAGNOSIS — E559 Vitamin D deficiency, unspecified: Secondary | ICD-10-CM

## 2018-12-23 MED ORDER — VITAMIN D (ERGOCALCIFEROL) 1.25 MG (50000 UNIT) PO CAPS: 50000.0000 [IU] | ORAL_CAPSULE | ORAL | 0 refills | Status: DC

## 2019-01-04 ENCOUNTER — Ambulatory Visit (INDEPENDENT_AMBULATORY_CARE_PROVIDER_SITE_OTHER): Payer: BC Managed Care – PPO | Admitting: Family Medicine

## 2019-01-04 ENCOUNTER — Encounter (INDEPENDENT_AMBULATORY_CARE_PROVIDER_SITE_OTHER): Payer: Self-pay | Admitting: Family Medicine

## 2019-01-04 VITALS — BP 117/70 | HR 61 | Temp 98.1°F | Ht 63.0 in | Wt 220.0 lb

## 2019-01-04 DIAGNOSIS — I10 Essential (primary) hypertension: Secondary | ICD-10-CM

## 2019-01-04 DIAGNOSIS — Z6839 Body mass index (BMI) 39.0-39.9, adult: Secondary | ICD-10-CM

## 2019-01-04 NOTE — Progress Notes (Signed)
Office: [phone]  /  Fax: [phone]   HPI:   Chief Complaint: OBESITY Cynthia Short is here to discuss her progress with her obesity treatment plan. She is on the Category 1 plan and Category 2 plan and is following her eating plan approximately 65 to 70 % of the time. She states she is exercising 0 minutes 0 times per week. Cynthia Short was changed to the Category 1 plan and was losing weight, but then started gaining. Her spouse tells me that she is still drinking a lot of sweet tea.  Her weight is 220 lb (99.8 kg) today and has had a weight gain of 2 pounds over a period of 2 weeks since her last visit. She has lost 5 lbs since starting treatment with [name].  Hypertension Cynthia Short is a 63 y.o. female with hypertension. Cynthia Short's blood pressure is stable on medications. She is working on her diet and weight loss to help control her blood pressure with the goal of decreasing her risk of heart attack and stroke. Cynthia Short denies chest pain or headaches.  ASSESSMENT AND PLAN:  Essential hypertension  Class 2 severe obesity with serious comorbidity and body mass index (BMI) of 39.0 to 39.9 in adult, unspecified obesity type ([place])  PLAN:  Hypertension We discussed sodium restriction, working on healthy weight loss, and a regular exercise program as the means to achieve improved blood pressure control. We will continue to monitor her blood pressure as well as her progress with the above lifestyle modifications. She will continue her diet and weight loss and will watch for signs of hypotension as she continues her lifestyle modifications. We will continue to monitor her blood pressure. Cynthia Short agreed with this plan and agreed to follow up as directed in 3 weeks.  I spent > than 50% of the 15 minute visit on counseling as documented in the note.  Obesity Cynthia Short is currently in the action stage of change. As such, her goal is to continue with weight loss efforts. She has agreed to change to keep a  food journal of 1200 to 1400 calories and 75+ grams of protein. Cynthia Short has been instructed to work up to a goal of 150 minutes of combined cardio and strengthening exercise per week for weight loss and overall health benefits. We discussed the following Behavioral Modification Strategies today: increasing lean protein intake, decreasing simple carbohydrates, increasing vegetables, increase H2O intake, and decrease liquid calories.  Cynthia Short has agreed to follow up with our clinic in 3 weeks for a fasting IC appointment. She was informed of the importance of frequent follow up visits to maximize her success with intensive lifestyle modifications for her multiple health conditions.  ALLERGIES: Allergies  Allergen Reactions  . Penicillins   . Trifluoperazine     MEDICATIONS: Current Outpatient Medications on File Prior to Visit  Medication Sig Dispense Refill  . aspirin 81 MG tablet Take 81 mg by mouth daily.    [name] atenolol (TENORMIN) 25 MG tablet TAKE 1 TABLET BY MOUTH ONCE DAILY 30 tablet 0  . estradiol (VIVELLE-DOT) 0.1 MG/24HR Place 1 patch onto the skin 2 (two) times a week.    . levothyroxine (SYNTHROID, LEVOTHROID) 75 MCG tablet Take 75 mcg by mouth daily.    [name] losartan (COZAAR) 50 MG tablet Take 50 mg by mouth daily.    . metFORMIN (GLUCOPHAGE-XR) 500 MG 24 hr tablet Take 1 tablet (500 mg total) by mouth as directed. Take three tabs po daily 90 tablet 0  . Multiple  Vitamins-Minerals (HAIR/SKIN/NAILS/BIOTIN PO) Take 1 capsule by mouth daily.    [name] omeprazole (PRILOSEC) 20 MG capsule Take 1 capsule by mouth daily.     . simvastatin (ZOCOR) 10 MG tablet Take 10 mg by mouth daily.    . SYMBICORT 80-4.5 MCG/ACT inhaler Inhale 2 puffs into the lungs Twice daily.    . vitamin B-12 (CYANOCOBALAMIN) 1000 MCG tablet Take 1 tablet (1,000 mcg total) by mouth daily. 30 tablet 0  . Vitamin D, Ergocalciferol, (DRISDOL) 1.25 MG (50000 UT) CAPS capsule Take 1 capsule (50,000 Units total) by mouth every 7  (seven) days. 4 capsule 0   No current facility-administered medications on file prior to visit.     PAST MEDICAL HISTORY: Past Medical History:  Diagnosis Date  . Anemia   . Anxiety   . Arthritis   . Asthma   . Chest pain   . Constipation   . Diabetes mellitus   . Dyspnea   . Gallbladder problem   . GERD (gastroesophageal reflux disease)   . Hair loss   . Heart murmur   . Hiatal hernia   . Hip problem   . Hyperlipemia   . Hypertension   . IBS (irritable bowel syndrome)   . Joint pain   . Knee problem   . Leg edema   . Thyroid disease    hypothyroidism  . Tricuspid valve disorder     PAST SURGICAL HISTORY: Past Surgical History:  Procedure Laterality Date  . ABDOMINAL HYSTERECTOMY  3/98  . CHOLECYSTECTOMY  02/2006  . KNEE SURGERY    . MYOMECTOMY  12/93   uterine fibroids  . TMJ ARTHROPLASTY  8/82    SOCIAL HISTORY: Social History   Tobacco Use  . Smoking status: Never Smoker  . Smokeless tobacco: Never Used  Substance Use Topics  . Alcohol use: No  . Drug use: No    FAMILY HISTORY: Family History  Problem Relation Age of Onset  . Cancer Father        lung  . Diabetes Father   . Heart disease Father   . Stroke Father   . Kidney disease Father   . Heart disease Maternal Uncle   . Cancer Maternal Grandmother        colon  . Heart disease Maternal Grandfather   . Cancer Paternal Grandmother        colon   ROS: Review of Systems  Constitutional: Negative for weight loss.  Cardiovascular: Negative for chest pain.  Neurological: Negative for headaches.   PHYSICAL EXAM: Blood pressure 117/70, pulse 61, temperature 98.1 F (36.7 C), temperature source Oral, height 5\' 3"  (1.6 m), weight 220 lb (99.8 kg), SpO2 98 %. Body mass index is 38.97 kg/m. Physical Exam Vitals signs reviewed.  Constitutional:      Appearance: Normal appearance. She is obese.  Cardiovascular:     Rate and Rhythm: Normal rate.  Pulmonary:     Effort: Pulmonary effort  is normal.  Musculoskeletal: Normal range of motion.  Skin:    General: Skin is warm and dry.  Neurological:     Mental Status: She is alert and oriented to person, place, and time.  Psychiatric:        Mood and Affect: Mood normal.        Behavior: Behavior normal.    RECENT LABS AND TESTS: BMET    Component Value Date/Time   NA 138 11/23/2018 0947   K 4.6 11/23/2018 0947   CL 98 11/23/2018 0947  CO2 23 11/23/2018 0947   GLUCOSE 110 (H) 11/23/2018 0947   BUN 11 11/23/2018 0947   CREATININE 0.77 11/23/2018 0947   CALCIUM 9.1 11/23/2018 0947   GFRNONAA 83 11/23/2018 0947   GFRAA 96 11/23/2018 0947   Lab Results  Component Value Date   HGBA1C 6.4 (H) 11/23/2018   HGBA1C 6.3 (H) 08/26/2018   Lab Results  Component Value Date   INSULIN 24.9 11/23/2018   INSULIN 14.9 08/26/2018   CBC    Component Value Date/Time   WBC 7.3 08/26/2018 1055   RBC 4.45 08/26/2018 1055   HGB 12.6 08/26/2018 1055   HCT 39.0 08/26/2018 1055   MCV 88 08/26/2018 1055   MCH 28.3 08/26/2018 1055   MCHC 32.3 08/26/2018 1055   RDW 13.1 08/26/2018 1055   LYMPHSABS 2.1 08/26/2018 1055   EOSABS 0.3 08/26/2018 1055   BASOSABS 0.0 08/26/2018 1055   Iron/TIBC/Ferritin/ %Sat No results found for: IRON, TIBC, FERRITIN, IRONPCTSAT Lipid Panel     Component Value Date/Time   CHOL 131 08/26/2018 1055   TRIG 91 08/26/2018 1055   HDL 42 08/26/2018 1055   LDLCALC 71 08/26/2018 1055   Hepatic Function Panel     Component Value Date/Time   PROT 7.3 11/23/2018 0947   ALBUMIN 4.2 11/23/2018 0947   AST 16 11/23/2018 0947   ALT 16 11/23/2018 0947   ALKPHOS 80 11/23/2018 0947   BILITOT 0.3 11/23/2018 0947      Component Value Date/Time   TSH 1.740 11/23/2018 0947   TSH 2.320 08/26/2018 1055   Results for LATIESHA, HARADA (MRN [id number]) as of 01/04/2019 13:09  Ref. Range 11/23/2018 09:47  Vitamin D, 25-Hydroxy Latest Ref Range: 30.0 - 100.0 ng/mL 24.8 (L)   OBESITY BEHAVIORAL INTERVENTION  VISIT  Today's visit was # 8   Starting weight: 225 lbs Starting date: 08/26/18 Today's weight : Weight: 220 lb (99.8 kg)  Today's date: 01/04/2019 Total lbs lost to date: 5  ASK: We discussed the diagnosis of obesity with Cynthia Short today and Cynthia Short agreed to give [name] permission to discuss obesity behavioral modification therapy today.  ASSESS: Cynthia Short has the diagnosis of obesity and her BMI today is 38.9. Cynthia Short is in the action stage of change.   ADVISE: [place] was educated on the multiple health risks of obesity as well as the benefit of weight loss to improve her health. She was advised of the need for long term treatment and the importance of lifestyle modifications to improve her current health and to decrease her risk of future health problems.  AGREE: Multiple dietary modification options and treatment options were discussed and Cynthia Short agreed to follow the recommendations documented in the above note.  ARRANGE: Cynthia Short was educated on the importance of frequent visits to treat obesity as outlined per CMS and USPSTF guidelines and agreed to schedule her next follow up appointment today.  I, Marcille Blanco, am acting as transcriptionist for Starlyn Skeans, MD  I have reviewed the above documentation for accuracy and completeness, and I agree with the above. -Dennard Nip, MD

## 2019-01-25 ENCOUNTER — Other Ambulatory Visit (INDEPENDENT_AMBULATORY_CARE_PROVIDER_SITE_OTHER): Payer: Self-pay | Admitting: Family Medicine

## 2019-01-25 DIAGNOSIS — E119 Type 2 diabetes mellitus without complications: Secondary | ICD-10-CM

## 2019-01-31 ENCOUNTER — Ambulatory Visit (INDEPENDENT_AMBULATORY_CARE_PROVIDER_SITE_OTHER): Payer: Self-pay | Admitting: Family Medicine

## 2019-02-15 ENCOUNTER — Encounter (INDEPENDENT_AMBULATORY_CARE_PROVIDER_SITE_OTHER): Payer: Self-pay | Admitting: Family Medicine

## 2019-02-15 ENCOUNTER — Ambulatory Visit (INDEPENDENT_AMBULATORY_CARE_PROVIDER_SITE_OTHER): Payer: BC Managed Care – PPO | Admitting: Family Medicine

## 2019-02-15 VITALS — BP 125/74 | HR 60 | Ht 63.0 in | Wt 220.0 lb

## 2019-02-15 DIAGNOSIS — E119 Type 2 diabetes mellitus without complications: Secondary | ICD-10-CM

## 2019-02-15 DIAGNOSIS — Z9189 Other specified personal risk factors, not elsewhere classified: Secondary | ICD-10-CM

## 2019-02-15 DIAGNOSIS — R0602 Shortness of breath: Secondary | ICD-10-CM

## 2019-02-15 DIAGNOSIS — Z6839 Body mass index (BMI) 39.0-39.9, adult: Secondary | ICD-10-CM

## 2019-02-15 DIAGNOSIS — E559 Vitamin D deficiency, unspecified: Secondary | ICD-10-CM

## 2019-02-15 MED ORDER — VITAMIN D (ERGOCALCIFEROL) 1.25 MG (50000 UNIT) PO CAPS: 50000.0000 [IU] | ORAL_CAPSULE | ORAL | 0 refills | Status: DC

## 2019-02-15 NOTE — Progress Notes (Signed)
Office: [phone]  /  Fax: [phone]   HPI:   Chief Complaint: OBESITY Cynthia Short is here to discuss her progress with her obesity treatment plan. She agreed to keep a food journal with 1200 to 1400 calories and 75+ grams of protein daily at the last visit, however she is following the Category 1 and vegetarian plan. Cynthia Short is following her eating plan approximately 50 % of the time. She states she is walking  For 30 minutes 2 times per week. Cynthia Short has done well with maintaining weight, but she has stalled in her weight loss efforts. She has been doing things for other people and she is not concentrating on meal planning and prepping. Her weight is 220 lb (99.8 kg) today and she has maintained weight over a period of 6 weeks since her last visit. She has lost 5 lbs since starting treatment with [name].  Vitamin D deficiency Cynthia Short has a diagnosis of vitamin D deficiency and she is due for labs. Cynthia Short is stable on vit D and she denies nausea, vomiting or muscle weakness.  Diabetes II Cynthia Short has a diagnosis of diabetes type II and she is due for labs. Cynthia Short is stable on metformin, but she is struggling with the eating plan. Cynthia Short is not checking her blood sugar at home. She has been working on intensive lifestyle modifications including diet, exercise, and weight loss to help control her blood glucose levels.  At risk for cardiovascular disease Cynthia Short is at a higher than average risk for cardiovascular disease due to obesity and diabetes. She currently denies any chest pain.  Shortness of Breath on Exertion Cynthia Short notes shortness of breath that is not improved, even with weight loss. She has decreased exercise. We are doing a repeat indirect calorimetry today.   ASSESSMENT AND PLAN:  Vitamin D deficiency - Plan: VITAMIN D 25 Hydroxy (Vit-D Deficiency, Fractures), Vitamin D, Ergocalciferol, (DRISDOL) 1.25 MG (50000 UT) CAPS capsule  Type 2 diabetes mellitus without complication, without long-term  current use of insulin (HCC) - Plan: Comprehensive metabolic panel, Hemoglobin A1c, Insulin, random  Shortness of breath on exertion  At risk for heart disease  Class 2 severe obesity with serious comorbidity and body mass index (BMI) of 39.0 to 39.9 in adult, unspecified obesity type ([place])  PLAN:  Vitamin D Deficiency Cynthia Short was informed that low vitamin D levels contributes to fatigue and are associated with obesity, breast, and colon cancer. She agrees to continue to take prescription Vit D @50 ,000 IU every week #4 with no refills and will follow up for routine testing of vitamin D, at least 2-3 times per year. She was informed of the risk of over-replacement of vitamin D and agrees to not increase her dose unless she discusses this with [name] first. We will check vitamin D level today and Elzie will follow up as directed.  Diabetes II Cynthia Short has been given extensive diabetes education by myself today including ideal fasting and post-prandial blood glucose readings, individual ideal Hgb A1c goals and hypoglycemia prevention. We discussed the importance of good blood sugar control to decrease the likelihood of diabetic complications such as nephropathy, neuropathy, limb loss, blindness, coronary artery disease, and death. We discussed the importance of intensive lifestyle modification including diet, exercise and weight loss as the first line treatment for diabetes. We will check labs today and Cynthia Short will get back to diet and follow up at the agreed upon time.  Cardiovascular risk counseling Cynthia Short was given extended (15 minutes) coronary artery disease prevention  counseling today. She is 63 y.o. female and has risk factors for heart disease including obesity and diabetes. We discussed intensive lifestyle modifications today with an emphasis on specific weight loss instructions and strategies. Pt was also informed of the importance of increasing exercise and decreasing saturated fats to help prevent  heart disease.  Shortness of Breath on Exertion Repeat indirect calorimeter results showed a decreased VO2 of 195 and a decreased REE of 1365. She will get back to diet and exercise to treat her exercise induced shortness of breath.  Obesity Cynthia Short is currently in the action stage of change. As such, her goal is to continue with weight loss efforts She has agreed to follow the Category 1 plan Cynthia Short has been instructed to work up to a goal of 150 minutes of combined cardio and strengthening exercise per week for weight loss and overall health benefits. We discussed the following Behavioral Modification Strategies today: increasing lean protein intake, decreasing simple carbohydrates  and work on meal planning and easy cooking plans  Cynthia Short has agreed to follow up with our clinic in 3 weeks. She was informed of the importance of frequent follow up visits to maximize her success with intensive lifestyle modifications for her multiple health conditions.  ALLERGIES: Allergies  Allergen Reactions  . Penicillins   . Trifluoperazine     MEDICATIONS: Current Outpatient Medications on File Prior to Visit  Medication Sig Dispense Refill  . aspirin 81 MG tablet Take 81 mg by mouth daily.    [name] atenolol (TENORMIN) 25 MG tablet TAKE 1 TABLET BY MOUTH ONCE DAILY 30 tablet 0  . estradiol (VIVELLE-DOT) 0.1 MG/24HR Place 1 patch onto the skin 2 (two) times a week.    . levothyroxine (SYNTHROID, LEVOTHROID) 75 MCG tablet Take 75 mcg by mouth daily.    [name] losartan (COZAAR) 50 MG tablet Take 50 mg by mouth daily.    . metFORMIN (GLUCOPHAGE-XR) 500 MG 24 hr tablet TAKE 3 TABLETS BY MOUTH EVERY DAY 90 tablet 0  . Multiple Vitamins-Minerals (HAIR/SKIN/NAILS/BIOTIN PO) Take 1 capsule by mouth daily.    [name] omeprazole (PRILOSEC) 20 MG capsule Take 1 capsule by mouth daily.     . simvastatin (ZOCOR) 10 MG tablet Take 10 mg by mouth daily.    . SYMBICORT 80-4.5 MCG/ACT inhaler Inhale 2 puffs into the lungs Twice  daily.    . vitamin B-12 (CYANOCOBALAMIN) 1000 MCG tablet Take 1 tablet (1,000 mcg total) by mouth daily. 30 tablet 0   No current facility-administered medications on file prior to visit.     PAST MEDICAL HISTORY: Past Medical History:  Diagnosis Date  . Anemia   . Anxiety   . Arthritis   . Asthma   . Chest pain   . Constipation   . Diabetes mellitus   . Dyspnea   . Gallbladder problem   . GERD (gastroesophageal reflux disease)   . Hair loss   . Heart murmur   . Hiatal hernia   . Hip problem   . Hyperlipemia   . Hypertension   . IBS (irritable bowel syndrome)   . Joint pain   . Knee problem   . Leg edema   . Thyroid disease    hypothyroidism  . Tricuspid valve disorder     PAST SURGICAL HISTORY: Past Surgical History:  Procedure Laterality Date  . ABDOMINAL HYSTERECTOMY  3/98  . CHOLECYSTECTOMY  02/2006  . KNEE SURGERY    . MYOMECTOMY  12/93   uterine fibroids  .  TMJ ARTHROPLASTY  8/82    SOCIAL HISTORY: Social History   Tobacco Use  . Smoking status: Never Smoker  . Smokeless tobacco: Never Used  Substance Use Topics  . Alcohol use: No  . Drug use: No    FAMILY HISTORY: Family History  Problem Relation Age of Onset  . Cancer Father        lung  . Diabetes Father   . Heart disease Father   . Stroke Father   . Kidney disease Father   . Heart disease Maternal Uncle   . Cancer Maternal Grandmother        colon  . Heart disease Maternal Grandfather   . Cancer Paternal Grandmother        colon    ROS: Review of Systems  Constitutional: Negative for weight loss.  Respiratory: Positive for shortness of breath (on exertion).   Cardiovascular: Negative for chest pain.  Gastrointestinal: Negative for nausea and vomiting.  Musculoskeletal:       Negative for muscle weakness    PHYSICAL EXAM: Blood pressure 125/74, pulse 60, height 5\' 3"  (1.6 m), weight 220 lb (99.8 kg), SpO2 99 %. Body mass index is 38.97 kg/m. Physical Exam Vitals signs  reviewed.  Constitutional:      Appearance: Normal appearance. She is well-developed. She is obese.  Cardiovascular:     Rate and Rhythm: Normal rate.  Pulmonary:     Effort: Pulmonary effort is normal.  Musculoskeletal: Normal range of motion.  Skin:    General: Skin is warm and dry.  Neurological:     Mental Status: She is alert and oriented to person, place, and time.  Psychiatric:        Mood and Affect: Mood normal.        Behavior: Behavior normal.     RECENT LABS AND TESTS: BMET    Component Value Date/Time   NA 138 11/23/2018 0947   K 4.6 11/23/2018 0947   CL 98 11/23/2018 0947   CO2 23 11/23/2018 0947   GLUCOSE 110 (H) 11/23/2018 0947   BUN 11 11/23/2018 0947   CREATININE 0.77 11/23/2018 0947   CALCIUM 9.1 11/23/2018 0947   GFRNONAA 83 11/23/2018 0947   GFRAA 96 11/23/2018 0947   Lab Results  Component Value Date   HGBA1C 6.4 (H) 11/23/2018   HGBA1C 6.3 (H) 08/26/2018   Lab Results  Component Value Date   INSULIN 24.9 11/23/2018   INSULIN 14.9 08/26/2018   CBC    Component Value Date/Time   WBC 7.3 08/26/2018 1055   RBC 4.45 08/26/2018 1055   HGB 12.6 08/26/2018 1055   HCT 39.0 08/26/2018 1055   MCV 88 08/26/2018 1055   MCH 28.3 08/26/2018 1055   MCHC 32.3 08/26/2018 1055   RDW 13.1 08/26/2018 1055   LYMPHSABS 2.1 08/26/2018 1055   EOSABS 0.3 08/26/2018 1055   BASOSABS 0.0 08/26/2018 1055   Iron/TIBC/Ferritin/ %Sat No results found for: IRON, TIBC, FERRITIN, IRONPCTSAT Lipid Panel     Component Value Date/Time   CHOL 131 08/26/2018 1055   TRIG 91 08/26/2018 1055   HDL 42 08/26/2018 1055   LDLCALC 71 08/26/2018 1055   Hepatic Function Panel     Component Value Date/Time   PROT 7.3 11/23/2018 0947   ALBUMIN 4.2 11/23/2018 0947   AST 16 11/23/2018 0947   ALT 16 11/23/2018 0947   ALKPHOS 80 11/23/2018 0947   BILITOT 0.3 11/23/2018 0947      Component Value Date/Time  TSH 1.740 11/23/2018 0947   TSH 2.320 08/26/2018 1055    Results for MADALYNN, PICKELSIMER (MRN [id number]) as of 02/15/2019 15:49  Ref. Range 11/23/2018 09:47  Vitamin D, 25-Hydroxy Latest Ref Range: 30.0 - 100.0 ng/mL 24.8 (L)     OBESITY BEHAVIORAL INTERVENTION VISIT  Today's visit was # 9   Starting weight: 225 lbs Starting date: 08/26/2018 Today's weight : 220 lbs Today's date: 02/15/2019 Total lbs lost to date: 5    02/15/2019  Height 5\' 3"  (1.6 m)  Weight 220 lb (99.8 kg)  BMI (Calculated) 38.98  BLOOD PRESSURE - SYSTOLIC 371  BLOOD PRESSURE - DIASTOLIC 74   Body Fat % 69.6 %  Total Body Water (lbs) 80.2 lbs  RMR 1365    ASK: We discussed the diagnosis of obesity with Cynthia Short today and Cynthia Short agreed to give [name] permission to discuss obesity behavioral modification therapy today.  ASSESS: Wynetta has the diagnosis of obesity and her BMI today is 58.98 [place] is in the action stage of change   ADVISE: Evah was educated on the multiple health risks of obesity as well as the benefit of weight loss to improve her health. She was advised of the need for long term treatment and the importance of lifestyle modifications to improve her current health and to decrease her risk of future health problems.  AGREE: Multiple dietary modification options and treatment options were discussed and  Cynthia Short agreed to follow the recommendations documented in the above note.  ARRANGE: Cynthia Short was educated on the importance of frequent visits to treat obesity as outlined per CMS and USPSTF guidelines and agreed to schedule her next follow up appointment today.  I, Doreene Nest, am acting as transcriptionist for Dennard Nip, MD  I have reviewed the above documentation for accuracy and completeness, and I agree with the above. -Dennard Nip, MD

## 2019-02-16 LAB — COMPREHENSIVE METABOLIC PANEL
ALT: 13 IU/L (ref 0–32)
AST: 14 IU/L (ref 0–40)
Albumin/Globulin Ratio: 1.1 — ABNORMAL LOW (ref 1.2–2.2)
Albumin: 3.8 g/dL (ref 3.8–4.8)
Alkaline Phosphatase: 74 IU/L (ref 39–117)
BUN/Creatinine Ratio: 18 (ref 12–28)
BUN: 12 mg/dL (ref 8–27)
Bilirubin Total: 0.4 mg/dL (ref 0.0–1.2)
CO2: 23 mmol/L (ref 20–29)
CREATININE: 0.67 mg/dL (ref 0.57–1.00)
Calcium: 9.2 mg/dL (ref 8.7–10.3)
Chloride: 100 mmol/L (ref 96–106)
GFR calc Af Amer: 109 mL/min/{1.73_m2} (ref >59)
GFR calc non Af Amer: 95 mL/min/{1.73_m2} (ref >59)
GLOBULIN, TOTAL: 3.4 g/dL (ref 1.5–4.5)
Glucose: 98 mg/dL (ref 65–99)
Potassium: 4.5 mmol/L (ref 3.5–5.2)
Sodium: 140 mmol/L (ref 134–144)
Total Protein: 7.2 g/dL (ref 6.0–8.5)

## 2019-02-16 LAB — HEMOGLOBIN A1C
Est. average glucose Bld gHb Est-mCnc: 137 mg/dL
HEMOGLOBIN A1C: 6.4 % — AB (ref 4.8–5.6)

## 2019-02-16 LAB — VITAMIN D 25 HYDROXY (VIT D DEFICIENCY, FRACTURES): Vit D, 25-Hydroxy: 30.9 ng/mL (ref 30.0–100.0)

## 2019-02-16 LAB — INSULIN, RANDOM: INSULIN: 18.5 u[IU]/mL (ref 2.6–24.9)

## 2019-03-07 ENCOUNTER — Ambulatory Visit (INDEPENDENT_AMBULATORY_CARE_PROVIDER_SITE_OTHER): Payer: Self-pay | Admitting: Family Medicine

## 2019-03-10 ENCOUNTER — Encounter (INDEPENDENT_AMBULATORY_CARE_PROVIDER_SITE_OTHER): Payer: Self-pay

## 2019-03-20 ENCOUNTER — Other Ambulatory Visit (INDEPENDENT_AMBULATORY_CARE_PROVIDER_SITE_OTHER): Payer: Self-pay | Admitting: Family Medicine

## 2019-03-20 DIAGNOSIS — E119 Type 2 diabetes mellitus without complications: Secondary | ICD-10-CM

## 2019-03-20 DIAGNOSIS — E559 Vitamin D deficiency, unspecified: Secondary | ICD-10-CM

## 2019-03-21 NOTE — Telephone Encounter (Signed)
Scheduled with patient on 4/8 at 8:40

## 2019-03-21 NOTE — Telephone Encounter (Signed)
Can we get this patient scheduled this week per Dr. Leafy Ro.

## 2019-03-22 NOTE — Telephone Encounter (Signed)
Thank you :)

## 2019-03-23 ENCOUNTER — Encounter (INDEPENDENT_AMBULATORY_CARE_PROVIDER_SITE_OTHER): Payer: Self-pay | Admitting: Family Medicine

## 2019-03-23 ENCOUNTER — Ambulatory Visit (INDEPENDENT_AMBULATORY_CARE_PROVIDER_SITE_OTHER): Payer: BC Managed Care – PPO | Admitting: Family Medicine

## 2019-03-23 ENCOUNTER — Other Ambulatory Visit: Payer: Self-pay

## 2019-03-23 DIAGNOSIS — J3089 Other allergic rhinitis: Secondary | ICD-10-CM

## 2019-03-23 DIAGNOSIS — E119 Type 2 diabetes mellitus without complications: Secondary | ICD-10-CM

## 2019-03-23 DIAGNOSIS — Z6838 Body mass index (BMI) 38.0-38.9, adult: Secondary | ICD-10-CM

## 2019-03-23 MED ORDER — FLUTICASONE PROPIONATE 50 MCG/ACT NA SUSP: 2.0000 | Freq: Every day | NASAL | 0 refills | Status: DC

## 2019-03-23 NOTE — Progress Notes (Signed)
Office: [phone]  /  Fax: [phone] TeleHealth Visit:  Cynthia Short has verbally consented to this TeleHealth visit today. The patient is located at home, the provider is located at the [facility] office. The participants in this visit include the listed provider and patient. The visit was conducted today via Webex.  HPI:   Chief Complaint: OBESITY Cynthia Short is here to discuss her progress with her obesity treatment plan. She is on the Category 2 plan and is following her eating plan approximately 75 % of the time. She states she is walking 60 to 90 minutes 7 times per week. Cynthia Short feels that she has done well and lost about 8 pounds since our last visit. She is doing better with meal planning. She is trying to avoid boredom eating, but this has been a challenge.  We were unable to weigh the patient today for this TeleHealth visit. She feels as if she has lost weight since her last visit. She has lost 5 lbs since starting treatment with Cynthia Short.  Diabetes II Tura has a diagnosis of diabetes type II. Cynthia Short states that her fasting BGs range between 98 and 118. She is stable on metformin as her last A1c was 6.4 on 02/15/19. She has been working on intensive lifestyle modifications including diet, exercise, and weight loss to help control her blood glucose levels. She denies any nausea, vomiting, or hypoglycemic episodes.   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 She has done well with Flonase in the past.  ASSESSMENT AND PLAN:  Type 2 diabetes mellitus without complication, without long-term current use of insulin (HCC)  Allergic rhinitis due to other allergic trigger, unspecified seasonality  Class 2 severe obesity with serious comorbidity and body mass index (BMI) of 38.0 to 38.9 in adult, unspecified obesity type ([place])  PLAN:  Diabetes II Cynthia Short has been given extensive diabetes education by myself  today including ideal fasting and post-prandial blood glucose readings, individual ideal Hgb A1c goals, and hypoglycemia prevention. We discussed the importance of good blood sugar control to decrease the likelihood of diabetic complications such as nephropathy, neuropathy, limb loss, blindness, coronary artery disease, and death. We discussed the importance of intensive lifestyle modification including diet, exercise, and weight loss as the first line treatment for diabetes. Cynthia Short agrees to continue her diet, exercise, and metformin. She was asked to check her post prandial blood sugar as well and she will follow up at the agreed upon time.   Allergic Rhinitis Cynthia Short agrees to start Flonase 50 mcg, 2 sprays in each nostril with no refills. She agrees to follow up as directed in 3 weeks.  Obesity Cynthia Short is currently in the action stage of change. As such, her goal is to continue with weight loss efforts. She has agreed to follow the Category 2 plan. Cynthia Short has been instructed to work up to a goal of 150 minutes of combined cardio and strengthening exercise per week for weight loss and overall health benefits. We discussed the following Behavioral Modification Strategies today: increasing lean protein intake, work on meal planning and easy cooking plans, and ways to avoid boredom eating.  Cynthia Short has agreed to follow up with our clinic in 3 weeks. She was informed of the importance of frequent follow up visits to maximize her success with intensive lifestyle modifications for her multiple health conditions.  ALLERGIES: Allergies  Allergen Reactions  . Penicillins   . Trifluoperazine  MEDICATIONS: Current Outpatient Medications on File Prior to Visit  Medication Sig Dispense Refill  . aspirin 81 MG tablet Take 81 mg by mouth daily.    [name] atenolol (TENORMIN) 25 MG tablet TAKE 1 TABLET BY MOUTH ONCE DAILY 30 tablet 0  . estradiol (VIVELLE-DOT) 0.1 MG/24HR Place 1 patch onto the skin 2 (two) times  a week.    . levothyroxine (SYNTHROID, LEVOTHROID) 75 MCG tablet Take 75 mcg by mouth daily.    [name] losartan (COZAAR) 50 MG tablet Take 50 mg by mouth daily.    . metFORMIN (GLUCOPHAGE-XR) 500 MG 24 hr tablet TAKE 3 TABLETS BY MOUTH EVERY DAY 90 tablet 0  . Multiple Vitamins-Minerals (HAIR/SKIN/NAILS/BIOTIN PO) Take 1 capsule by mouth daily.    [name] omeprazole (PRILOSEC) 20 MG capsule Take 1 capsule by mouth daily.     . simvastatin (ZOCOR) 10 MG tablet Take 10 mg by mouth daily.    . SYMBICORT 80-4.5 MCG/ACT inhaler Inhale 2 puffs into the lungs Twice daily.    . vitamin B-12 (CYANOCOBALAMIN) 1000 MCG tablet Take 1 tablet (1,000 mcg total) by mouth daily. 30 tablet 0  . Vitamin D, Ergocalciferol, (DRISDOL) 1.25 MG (50000 UT) CAPS capsule Take 1 capsule (50,000 Units total) by mouth every 7 (seven) days. 4 capsule 0   No current facility-administered medications on file prior to visit.     PAST MEDICAL HISTORY: Past Medical History:  Diagnosis Date  . Anemia   . Anxiety   . Arthritis   . Asthma   . Chest pain   . Constipation   . Diabetes mellitus   . Dyspnea   . Gallbladder problem   . GERD (gastroesophageal reflux disease)   . Hair loss   . Heart murmur   . Hiatal hernia   . Hip problem   . Hyperlipemia   . Hypertension   . IBS (irritable bowel syndrome)   . Joint pain   . Knee problem   . Leg edema   . Thyroid disease    hypothyroidism  . Tricuspid valve disorder     PAST SURGICAL HISTORY: Past Surgical History:  Procedure Laterality Date  . ABDOMINAL HYSTERECTOMY  3/98  . CHOLECYSTECTOMY  02/2006  . KNEE SURGERY    . MYOMECTOMY  12/93   uterine fibroids  . TMJ ARTHROPLASTY  8/82    SOCIAL HISTORY: Social History   Tobacco Use  . Smoking status: Never Smoker  . Smokeless tobacco: Never Used  Substance Use Topics  . Alcohol use: No  . Drug use: No    FAMILY HISTORY: Family History  Problem Relation Age of Onset  . Cancer Father        lung  . Diabetes  Father   . Heart disease Father   . Stroke Father   . Kidney disease Father   . Heart disease Maternal Uncle   . Cancer Maternal Grandmother        colon  . Heart disease Maternal Grandfather   . Cancer Paternal Grandmother        colon    ROS: Review of Systems  HENT:       Positive for rhinorrhea.  Eyes:       Positive for itching eyes.  Gastrointestinal: Negative for nausea and vomiting.  Endo/Heme/Allergies:       Negative for hypoglycemia.    PHYSICAL EXAM: Pt in no acute distress  RECENT LABS AND TESTS: BMET    Component Value Date/Time   NA  140 02/15/2019 1253   K 4.5 02/15/2019 1253   CL 100 02/15/2019 1253   CO2 23 02/15/2019 1253   GLUCOSE 98 02/15/2019 1253   BUN 12 02/15/2019 1253   CREATININE 0.67 02/15/2019 1253   CALCIUM 9.2 02/15/2019 1253   GFRNONAA 95 02/15/2019 1253   GFRAA 109 02/15/2019 1253   Lab Results  Component Value Date   HGBA1C 6.4 (H) 02/15/2019   HGBA1C 6.4 (H) 11/23/2018   HGBA1C 6.3 (H) 08/26/2018   Lab Results  Component Value Date   INSULIN 18.5 02/15/2019   INSULIN 24.9 11/23/2018   INSULIN 14.9 08/26/2018   CBC    Component Value Date/Time   WBC 7.3 08/26/2018 1055   RBC 4.45 08/26/2018 1055   HGB 12.6 08/26/2018 1055   HCT 39.0 08/26/2018 1055   MCV 88 08/26/2018 1055   MCH 28.3 08/26/2018 1055   MCHC 32.3 08/26/2018 1055   RDW 13.1 08/26/2018 1055   LYMPHSABS 2.1 08/26/2018 1055   EOSABS 0.3 08/26/2018 1055   BASOSABS 0.0 08/26/2018 1055   Iron/TIBC/Ferritin/ %Sat No results found for: IRON, TIBC, FERRITIN, IRONPCTSAT Lipid Panel     Component Value Date/Time   CHOL 131 08/26/2018 1055   TRIG 91 08/26/2018 1055   HDL 42 08/26/2018 1055   LDLCALC 71 08/26/2018 1055   Hepatic Function Panel     Component Value Date/Time   PROT 7.2 02/15/2019 1253   ALBUMIN 3.8 02/15/2019 1253   AST 14 02/15/2019 1253   ALT 13 02/15/2019 1253   ALKPHOS 74 02/15/2019 1253   BILITOT 0.4 02/15/2019 1253       Component Value Date/Time   TSH 1.740 11/23/2018 0947   TSH 2.320 08/26/2018 1055   Results for BETSI, CRESPI (MRN [id number]) as of 03/23/2019 09:39  Ref. Range 02/15/2019 12:53  Vitamin D, 25-Hydroxy Latest Ref Range: 30.0 - 100.0 ng/mL 30.9     I, Marcille Blanco, CMA, am acting as transcriptionist for Starlyn Skeans, MD I have reviewed the above documentation for accuracy and completeness, and I agree with the above. -Dennard Nip, MD

## 2019-03-30 ENCOUNTER — Ambulatory Visit (INDEPENDENT_AMBULATORY_CARE_PROVIDER_SITE_OTHER): Payer: Self-pay | Admitting: Family Medicine

## 2019-04-12 ENCOUNTER — Encounter (INDEPENDENT_AMBULATORY_CARE_PROVIDER_SITE_OTHER): Payer: Self-pay | Admitting: Family Medicine

## 2019-04-12 ENCOUNTER — Ambulatory Visit (INDEPENDENT_AMBULATORY_CARE_PROVIDER_SITE_OTHER): Payer: BC Managed Care – PPO | Admitting: Family Medicine

## 2019-04-12 ENCOUNTER — Other Ambulatory Visit: Payer: Self-pay

## 2019-04-12 DIAGNOSIS — Z6838 Body mass index (BMI) 38.0-38.9, adult: Secondary | ICD-10-CM | POA: Diagnosis not present

## 2019-04-12 DIAGNOSIS — E119 Type 2 diabetes mellitus without complications: Secondary | ICD-10-CM | POA: Diagnosis not present

## 2019-04-12 MED ORDER — METFORMIN HCL ER 500 MG PO TB24: 1500.0000 mg | ORAL_TABLET | Freq: Every day | ORAL | 0 refills | Status: DC

## 2019-04-12 NOTE — Progress Notes (Signed)
Office: [phone]  /  Fax: [phone] TeleHealth Visit:  Cynthia Short has verbally consented to this TeleHealth visit today. The patient is located at home, the provider is located at the [facility] office. The participants in this visit include the listed provider and patient. [name] was unable to use Webex today and the telehealth visit was conducted via telephone.   HPI:   Chief Complaint: OBESITY Cynthia Short is here to discuss her progress with her obesity treatment plan. She is on the Category 2 plan and is following her eating plan approximately 75 % of the time. She states she is walking 60 to 90 minutes 7 times per week. Cynthia Short continues to do well on her Category 2 plan. She has increased her walking to 60 minutes most days and she feels that she has lost another 2 pounds in the last 2 to 3 weeks. Her hunger is controlled and she is doing things around the house to keep herself occupied and decreasing boredom snacking.  We were unable to weigh the patient today for this TeleHealth visit. She feels as if she has lost weight since her last visit. She has lost 5 lbs since starting treatment with [name].  Diabetes II Cynthia Short has a diagnosis of diabetes type II. Cynthia Short states BGs range between 75 and 117 and her 2 hour post prandial readings are 123 to 143. Her last A1c was 6.4 on 02/15/19. She is doing well on hier diet prescription and has increased her exercise. She feels well overall. She denies any nausea, vomiting, or hypoglycemic episodes. She has been working on intensive lifestyle modifications including diet, exercise, and weight loss to help control her blood glucose levels.  ASSESSMENT AND PLAN:  Type 2 diabetes mellitus without complication, without long-term current use of insulin (HCC) - Plan: metFORMIN (GLUCOPHAGE-XR) 500 MG 24 hr tablet  Class 2 severe obesity with serious comorbidity and body mass index (BMI) of 38.0 to 38.9 in adult, unspecified obesity type  ([place])  PLAN:  Diabetes II Cynthia Short has been given extensive diabetes education by myself today including ideal fasting and post-prandial blood glucose readings, individual ideal Hgb A1c goals, and hypoglycemia prevention. We discussed the importance of good blood sugar control to decrease the likelihood of diabetic complications such as nephropathy, neuropathy, limb loss, blindness, coronary artery disease, and death. We discussed the importance of intensive lifestyle modification including diet, exercise, and weight loss as the first line treatment for diabetes. Cynthia Short agrees to continue her diet, exercise, and to take metformin 500 mg, 3 tablets PO qd #90 with no refills and will follow up at the agreed upon time in 2 weeks.  Obesity Cynthia Short is currently in the action stage of change. As such, her goal is to continue with weight loss efforts. She has agreed to follow the Category 2 plan. Cynthia Short has been instructed to work up to a goal of 150 minutes of combined cardio and strengthening exercise per week for weight loss and overall health benefits. We discussed the following Behavioral Modification Strategies today: ways to avoid boredom eating, better snacking choices, and ways to avoid night time snacking.  Warren has agreed to follow up with our clinic in 2 weeks. She was informed of the importance of frequent follow up visits to maximize her success with intensive lifestyle modifications for her multiple health conditions.  ALLERGIES: Allergies  Allergen Reactions  . Penicillins   . Trifluoperazine     MEDICATIONS: Current Outpatient Medications on File Prior to  Visit  Medication Sig Dispense Refill  . aspirin 81 MG tablet Take 81 mg by mouth daily.    [name] atenolol (TENORMIN) 25 MG tablet TAKE 1 TABLET BY MOUTH ONCE DAILY 30 tablet 0  . estradiol (VIVELLE-DOT) 0.1 MG/24HR Place 1 patch onto the skin 2 (two) times a week.    . fluticasone (FLONASE) 50 MCG/ACT nasal spray Place 2 sprays into  both nostrils daily. 16 g 0  . levothyroxine (SYNTHROID, LEVOTHROID) 75 MCG tablet Take 75 mcg by mouth daily.    [name] losartan (COZAAR) 50 MG tablet Take 50 mg by mouth daily.    . metFORMIN (GLUCOPHAGE-XR) 500 MG 24 hr tablet TAKE 3 TABLETS BY MOUTH EVERY DAY 90 tablet 0  . Multiple Vitamins-Minerals (HAIR/SKIN/NAILS/BIOTIN PO) Take 1 capsule by mouth daily.    [name] omeprazole (PRILOSEC) 20 MG capsule Take 1 capsule by mouth daily.     . simvastatin (ZOCOR) 10 MG tablet Take 10 mg by mouth daily.    . SYMBICORT 80-4.5 MCG/ACT inhaler Inhale 2 puffs into the lungs Twice daily.    . vitamin B-12 (CYANOCOBALAMIN) 1000 MCG tablet Take 1 tablet (1,000 mcg total) by mouth daily. 30 tablet 0  . Vitamin D, Ergocalciferol, (DRISDOL) 1.25 MG (50000 UT) CAPS capsule Take 1 capsule (50,000 Units total) by mouth every 7 (seven) days. 4 capsule 0   No current facility-administered medications on file prior to visit.     PAST MEDICAL HISTORY: Past Medical History:  Diagnosis Date  . Anemia   . Anxiety   . Arthritis   . Asthma   . Chest pain   . Constipation   . Diabetes mellitus   . Dyspnea   . Gallbladder problem   . GERD (gastroesophageal reflux disease)   . Hair loss   . Heart murmur   . Hiatal hernia   . Hip problem   . Hyperlipemia   . Hypertension   . IBS (irritable bowel syndrome)   . Joint pain   . Knee problem   . Leg edema   . Thyroid disease    hypothyroidism  . Tricuspid valve disorder     PAST SURGICAL HISTORY: Past Surgical History:  Procedure Laterality Date  . ABDOMINAL HYSTERECTOMY  3/98  . CHOLECYSTECTOMY  02/2006  . KNEE SURGERY    . MYOMECTOMY  12/93   uterine fibroids  . TMJ ARTHROPLASTY  8/82    SOCIAL HISTORY: Social History   Tobacco Use  . Smoking status: Never Smoker  . Smokeless tobacco: Never Used  Substance Use Topics  . Alcohol use: No  . Drug use: No    FAMILY HISTORY: Family History  Problem Relation Age of Onset  . Cancer Father         lung  . Diabetes Father   . Heart disease Father   . Stroke Father   . Kidney disease Father   . Heart disease Maternal Uncle   . Cancer Maternal Grandmother        colon  . Heart disease Maternal Grandfather   . Cancer Paternal Grandmother        colon    ROS: Review of Systems  Gastrointestinal: Negative for nausea and vomiting.  Endo/Heme/Allergies:       Negative for hypoglycemia.    PHYSICAL EXAM: Pt in no acute distress  RECENT LABS AND TESTS: BMET    Component Value Date/Time   NA 140 02/15/2019 1253   K 4.5 02/15/2019 1253   CL 100  02/15/2019 1253   CO2 23 02/15/2019 1253   GLUCOSE 98 02/15/2019 1253   BUN 12 02/15/2019 1253   CREATININE 0.67 02/15/2019 1253   CALCIUM 9.2 02/15/2019 1253   GFRNONAA 95 02/15/2019 1253   GFRAA 109 02/15/2019 1253   Lab Results  Component Value Date   HGBA1C 6.4 (H) 02/15/2019   HGBA1C 6.4 (H) 11/23/2018   HGBA1C 6.3 (H) 08/26/2018   Lab Results  Component Value Date   INSULIN 18.5 02/15/2019   INSULIN 24.9 11/23/2018   INSULIN 14.9 08/26/2018   CBC    Component Value Date/Time   WBC 7.3 08/26/2018 1055   RBC 4.45 08/26/2018 1055   HGB 12.6 08/26/2018 1055   HCT 39.0 08/26/2018 1055   MCV 88 08/26/2018 1055   MCH 28.3 08/26/2018 1055   MCHC 32.3 08/26/2018 1055   RDW 13.1 08/26/2018 1055   LYMPHSABS 2.1 08/26/2018 1055   EOSABS 0.3 08/26/2018 1055   BASOSABS 0.0 08/26/2018 1055   Iron/TIBC/Ferritin/ %Sat No results found for: IRON, TIBC, FERRITIN, IRONPCTSAT Lipid Panel     Component Value Date/Time   CHOL 131 08/26/2018 1055   TRIG 91 08/26/2018 1055   HDL 42 08/26/2018 1055   LDLCALC 71 08/26/2018 1055   Hepatic Function Panel     Component Value Date/Time   PROT 7.2 02/15/2019 1253   ALBUMIN 3.8 02/15/2019 1253   AST 14 02/15/2019 1253   ALT 13 02/15/2019 1253   ALKPHOS 74 02/15/2019 1253   BILITOT 0.4 02/15/2019 1253      Component Value Date/Time   TSH 1.740 11/23/2018 0947   TSH 2.320  08/26/2018 1055   Results for JANELLY, SWITALSKI (MRN [id number]) as of 04/12/2019 11:27  Ref. Range 02/15/2019 12:53  Vitamin D, 25-Hydroxy Latest Ref Range: 30.0 - 100.0 ng/mL 30.9    I, Marcille Blanco, CMA, am acting as transcriptionist for Starlyn Skeans, MD I have reviewed the above documentation for accuracy and completeness, and I agree with the above. -Dennard Nip, MD

## 2019-04-21 ENCOUNTER — Other Ambulatory Visit (INDEPENDENT_AMBULATORY_CARE_PROVIDER_SITE_OTHER): Payer: Self-pay | Admitting: Family Medicine

## 2019-04-26 ENCOUNTER — Encounter (INDEPENDENT_AMBULATORY_CARE_PROVIDER_SITE_OTHER): Payer: Self-pay | Admitting: Family Medicine

## 2019-04-26 ENCOUNTER — Ambulatory Visit (INDEPENDENT_AMBULATORY_CARE_PROVIDER_SITE_OTHER): Payer: BC Managed Care – PPO | Admitting: Family Medicine

## 2019-04-26 ENCOUNTER — Other Ambulatory Visit: Payer: Self-pay

## 2019-04-26 DIAGNOSIS — Z6838 Body mass index (BMI) 38.0-38.9, adult: Secondary | ICD-10-CM

## 2019-04-26 DIAGNOSIS — E559 Vitamin D deficiency, unspecified: Secondary | ICD-10-CM | POA: Diagnosis not present

## 2019-04-26 MED ORDER — VITAMIN D (ERGOCALCIFEROL) 1.25 MG (50000 UNIT) PO CAPS: 50000.0000 [IU] | ORAL_CAPSULE | ORAL | 0 refills | Status: DC

## 2019-04-27 NOTE — Progress Notes (Signed)
Office: [phone]  /  Fax: [phone] TeleHealth Visit:  [name] has verbally consented to this TeleHealth visit today. The patient is located at home, the provider is located at the [facility] office. The participants in this visit include the listed provider and patient. Takita was unable to use [email or web address] and the telehealth visit was conducted via telephone.  HPI:   Chief Complaint: OBESITY Cynthia Short is here to discuss her progress with her obesity treatment plan. She is on the Category 2 plan and is following her eating plan approximately 70 to 75 % of the time. She states she is walking 30 to 35 minutes 5 to 7 times per week. [name] feels that she has done well maintaining her weight and thinks that she has lost 1 to 2 pounds. She is working on not skipping meals and increasing protein and vegetables. Her hunger is controlled.  We were unable to weigh the patient today for this TeleHealth visit. She feels as if she has maintained weight since her last visit. She has lost 5 lbs since starting treatment with [name].  Vitamin D Deficiency Cynthia Short has a diagnosis of vitamin D deficiency. Her vitamin D level is very slowly improving on prescription vit D, but is not yet at goal. Cynthia Short denies nausea, vomiting, or muscle weakness.  ASSESSMENT AND PLAN:  Vitamin D deficiency - Plan: Vitamin D, Ergocalciferol, (DRISDOL) 1.25 MG (50000 UT) CAPS capsule  Class 2 severe obesity with serious comorbidity and body mass index (BMI) of 38.0 to 38.9 in adult, unspecified obesity type ([place])  PLAN:  Vitamin D Deficiency Cynthia Short was informed that low vitamin D levels contribute to fatigue and are associated with obesity, breast, and colon cancer. Cynthia Short agrees to continue to take prescription Vit D @50 ,000 IU every week #4 with no refills and will follow up for routine testing of vitamin D, at least 2-3 times per year. She was informed of the risk of over-replacement of vitamin D and agrees  to not increase her dose unless she discusses this with [name] first. Cynthia Short agrees to follow up in 2 weeks as directed.  I spent > than 50% of the 15 minute visit on counseling as documented in the note.  Obesity Cynthia Short is currently in the action stage of change. As such, her goal is to continue with weight loss efforts. She has agreed to follow the Category 2 plan. Cynthia Short has been instructed to work up to a goal of 150 minutes of combined cardio and strengthening exercise per week for weight loss and overall health benefits. We discussed the following Behavioral Modification Strategies today: ways to avoid night time snacking and keeping healthy foods in the home.  Cynthia Short has agreed to follow up with our clinic in 2 weeks. She was informed of the importance of frequent follow up visits to maximize her success with intensive lifestyle modifications for her multiple health conditions.  ALLERGIES: Allergies  Allergen Reactions   Penicillins    Trifluoperazine     MEDICATIONS: Current Outpatient Medications on File Prior to Visit  Medication Sig Dispense Refill   aspirin 81 MG tablet Take 81 mg by mouth daily.     atenolol (TENORMIN) 25 MG tablet TAKE 1 TABLET BY MOUTH ONCE DAILY 30 tablet 0   estradiol (VIVELLE-DOT) 0.1 MG/24HR Place 1 patch onto the skin 2 (two) times a week.     fluticasone (FLONASE) 50 MCG/ACT nasal spray Place 2 sprays into both nostrils daily. 16 g 0  levothyroxine (SYNTHROID, LEVOTHROID) 75 MCG tablet Take 75 mcg by mouth daily.     losartan (COZAAR) 50 MG tablet Take 50 mg by mouth daily.     metFORMIN (GLUCOPHAGE-XR) 500 MG 24 hr tablet Take 3 tablets (1,500 mg total) by mouth daily. 90 tablet 0   Multiple Vitamins-Minerals (HAIR/SKIN/NAILS/BIOTIN PO) Take 1 capsule by mouth daily.     omeprazole (PRILOSEC) 20 MG capsule Take 1 capsule by mouth daily.      simvastatin (ZOCOR) 10 MG tablet Take 10 mg by mouth daily.     SYMBICORT 80-4.5 MCG/ACT inhaler  Inhale 2 puffs into the lungs Twice daily.     vitamin B-12 (CYANOCOBALAMIN) 1000 MCG tablet Take 1 tablet (1,000 mcg total) by mouth daily. 30 tablet 0   No current facility-administered medications on file prior to visit.     PAST MEDICAL HISTORY: Past Medical History:  Diagnosis Date   Anemia    Anxiety    Arthritis    Asthma    Chest pain    Constipation    Diabetes mellitus    Dyspnea    Gallbladder problem    GERD (gastroesophageal reflux disease)    Hair loss    Heart murmur    Hiatal hernia    Hip problem    Hyperlipemia    Hypertension    IBS (irritable bowel syndrome)    Joint pain    Knee problem    Leg edema    Thyroid disease    hypothyroidism   Tricuspid valve disorder     PAST SURGICAL HISTORY: Past Surgical History:  Procedure Laterality Date   ABDOMINAL HYSTERECTOMY  3/98   CHOLECYSTECTOMY  02/2006   KNEE SURGERY     MYOMECTOMY  12/93   uterine fibroids   TMJ ARTHROPLASTY  8/82    SOCIAL HISTORY: Social History   Tobacco Use   Smoking status: Never Smoker   Smokeless tobacco: Never Used  Substance Use Topics   Alcohol use: No   Drug use: No    FAMILY HISTORY: Family History  Problem Relation Age of Onset   Cancer Father        lung   Diabetes Father    Heart disease Father    Stroke Father    Kidney disease Father    Heart disease Maternal Uncle    Cancer Maternal Grandmother        colon   Heart disease Maternal Grandfather    Cancer Paternal Grandmother        colon    ROS: Review of Systems  Gastrointestinal: Negative for nausea and vomiting.  Musculoskeletal:       Negative for muscle weakness.    PHYSICAL EXAM: Pt in no acute distress  RECENT LABS AND TESTS: BMET    Component Value Date/Time   NA 140 02/15/2019 1253   K 4.5 02/15/2019 1253   CL 100 02/15/2019 1253   CO2 23 02/15/2019 1253   GLUCOSE 98 02/15/2019 1253   BUN 12 02/15/2019 1253   CREATININE 0.67  02/15/2019 1253   CALCIUM 9.2 02/15/2019 1253   GFRNONAA 95 02/15/2019 1253   GFRAA 109 02/15/2019 1253   Lab Results  Component Value Date   HGBA1C 6.4 (H) 02/15/2019   HGBA1C 6.4 (H) 11/23/2018   HGBA1C 6.3 (H) 08/26/2018   Lab Results  Component Value Date   INSULIN 18.5 02/15/2019   INSULIN 24.9 11/23/2018   INSULIN 14.9 08/26/2018   CBC    Component  Value Date/Time   WBC 7.3 08/26/2018 1055   RBC 4.45 08/26/2018 1055   HGB 12.6 08/26/2018 1055   HCT 39.0 08/26/2018 1055   MCV 88 08/26/2018 1055   MCH 28.3 08/26/2018 1055   MCHC 32.3 08/26/2018 1055   RDW 13.1 08/26/2018 1055   LYMPHSABS 2.1 08/26/2018 1055   EOSABS 0.3 08/26/2018 1055   BASOSABS 0.0 08/26/2018 1055   Iron/TIBC/Ferritin/ %Sat No results found for: IRON, TIBC, FERRITIN, IRONPCTSAT Lipid Panel     Component Value Date/Time   CHOL 131 08/26/2018 1055   TRIG 91 08/26/2018 1055   HDL 42 08/26/2018 1055   LDLCALC 71 08/26/2018 1055   Hepatic Function Panel     Component Value Date/Time   PROT 7.2 02/15/2019 1253   ALBUMIN 3.8 02/15/2019 1253   AST 14 02/15/2019 1253   ALT 13 02/15/2019 1253   ALKPHOS 74 02/15/2019 1253   BILITOT 0.4 02/15/2019 1253      Component Value Date/Time   TSH 1.740 11/23/2018 0947   TSH 2.320 08/26/2018 1055   Results for SYBLE, PICCO (MRN [id number]) as of 04/27/2019 08:46  Ref. Range 02/15/2019 12:53  Vitamin D, 25-Hydroxy Latest Ref Range: 30.0 - 100.0 ng/mL 30.9     I, Marcille Blanco, CMA, am acting as transcriptionist for Starlyn Skeans, MD I have reviewed the above documentation for accuracy and completeness, and I agree with the above. -Dennard Nip, MD

## 2019-05-07 ENCOUNTER — Other Ambulatory Visit (INDEPENDENT_AMBULATORY_CARE_PROVIDER_SITE_OTHER): Payer: Self-pay | Admitting: Family Medicine

## 2019-05-07 DIAGNOSIS — E119 Type 2 diabetes mellitus without complications: Secondary | ICD-10-CM

## 2019-05-12 ENCOUNTER — Ambulatory Visit (INDEPENDENT_AMBULATORY_CARE_PROVIDER_SITE_OTHER): Payer: BC Managed Care – PPO | Admitting: Family Medicine

## 2019-05-12 ENCOUNTER — Other Ambulatory Visit: Payer: Self-pay

## 2019-05-12 ENCOUNTER — Encounter (INDEPENDENT_AMBULATORY_CARE_PROVIDER_SITE_OTHER): Payer: Self-pay | Admitting: Family Medicine

## 2019-05-12 DIAGNOSIS — Z6838 Body mass index (BMI) 38.0-38.9, adult: Secondary | ICD-10-CM

## 2019-05-12 DIAGNOSIS — E119 Type 2 diabetes mellitus without complications: Secondary | ICD-10-CM

## 2019-05-12 DIAGNOSIS — E559 Vitamin D deficiency, unspecified: Secondary | ICD-10-CM | POA: Diagnosis not present

## 2019-05-12 MED ORDER — METFORMIN HCL ER 500 MG PO TB24: 1500.0000 mg | ORAL_TABLET | Freq: Every day | ORAL | 0 refills | Status: DC

## 2019-05-12 MED ORDER — VITAMIN D (ERGOCALCIFEROL) 1.25 MG (50000 UNIT) PO CAPS: 50000.0000 [IU] | ORAL_CAPSULE | ORAL | 0 refills | Status: DC

## 2019-05-12 NOTE — Progress Notes (Signed)
Office: [phone]  /  Fax: [phone] TeleHealth Visit:  Cynthia Short has verbally consented to this TeleHealth visit today. The patient is located at home, the provider is located at the [facility] office. The participants in this visit include the listed provider and patient. Natalie was unable to use realtime audiovisual technology today and the telehealth visit was conducted via telephone.  HPI:   Chief Complaint: OBESITY Cynthia Short is here to discuss her progress with her obesity treatment plan. She is on the Category 1 plan and is following her eating plan approximately 50 % of the time. She states she is walking 50 to 75 minutes 7 times per week. Cynthia Short continues to do well maintaining her weight loss, even with COVID19 isolation. She is especially getting bored with lunch and would like additional lunch options.  We were unable to weigh the patient today for this TeleHealth visit. She feels as if she has lost weight since her last visit. She has lost 5 lbs since starting treatment with [name].  Vitamin D Deficiency Cynthia Short has a diagnosis of vitamin D deficiency. She is currently stable on vit D, but is not yet at goal. Cynthia Short denies nausea, vomiting, or muscle weakness.  Diabetes II Cynthia Short has a diagnosis of diabetes type II. Cynthia Short states that her fasting blood sugar ranges between 100 and 112 over the last 2 weeks and 2 hour post prandial sugar readings are rarely above 140. She is doing well on her diet prescription. Her last A1c was 6.4 on  02/15/19. She has been working on intensive lifestyle modifications including diet, exercise, and weight loss to help control her blood glucose levels. She denies any nausea, vomiting, or hypoglycemic episodes.   ASSESSMENT AND PLAN:  Vitamin D deficiency - Plan: Vitamin D, Ergocalciferol, (DRISDOL) 1.25 MG (50000 UT) CAPS capsule  Type 2 diabetes mellitus without complication, without long-term current use of insulin (HCC) - Plan:  metFORMIN (GLUCOPHAGE-XR) 500 MG 24 hr tablet  Class 2 severe obesity with serious comorbidity and body mass index (BMI) of 38.0 to 38.9 in adult, unspecified obesity type ([place])  PLAN:  Vitamin D Deficiency Cynthia Short was informed that low vitamin D levels contribute to fatigue and are associated with obesity, breast, and colon cancer. Cynthia Short agrees to continue to take prescription Vit D @50 ,000 IU every week #4 with no refills and will follow up for routine testing of vitamin D, at least 2-3 times per year. She was informed of the risk of over-replacement of vitamin D and agrees to not increase her dose unless she discusses this with [name] first. Cynthia Short agrees to follow up in 2 weeks as directed.  Diabetes II Cynthia Short has been given extensive diabetes education by myself today including ideal fasting and post-prandial blood glucose readings, individual ideal Hgb A1c goals, and hypoglycemia prevention. We discussed the importance of good blood sugar control to decrease the likelihood of diabetic complications such as nephropathy, neuropathy, limb loss, blindness, coronary artery disease, and death. We discussed the importance of intensive lifestyle modification including diet, exercise, and weight loss as the first line treatment for diabetes. Cynthia Short agrees to continue her metformin XR 500 mg, 3 tablets PO qd # 90 with no refills and will follow up at the agreed upon time.   I spent > than 50% of the 25 minute visit on counseling as documented in the note.  Obesity Cynthia Short is currently in the action stage of change. As such, her goal is to continue with  weight loss efforts. She has agreed to follow the Category 1 plan and keep a food journal of 300 to 450 calories and 30 grams of protein for lunch. Cynthia Short has been instructed to work up to a goal of 150 minutes of combined cardio and strengthening exercise per week for weight loss and overall health benefits. We discussed the following Behavioral Modification  Strategies today: work on meal planning and easy cooking plans, keep a strict food journal, and ways to avoid boredom eating.  Cynthia Short has agreed to follow up with our clinic in 2 weeks. She was informed of the importance of frequent follow up visits to maximize her success with intensive lifestyle modifications for her multiple health conditions.  ALLERGIES: Allergies  Allergen Reactions  . Penicillins   . Trifluoperazine     MEDICATIONS: Current Outpatient Medications on File Prior to Visit  Medication Sig Dispense Refill  . aspirin 81 MG tablet Take 81 mg by mouth daily.    [name] atenolol (TENORMIN) 25 MG tablet TAKE 1 TABLET BY MOUTH ONCE DAILY 30 tablet 0  . estradiol (VIVELLE-DOT) 0.1 MG/24HR Place 1 patch onto the skin 2 (two) times a week.    . fluticasone (FLONASE) 50 MCG/ACT nasal spray Place 2 sprays into both nostrils daily. 16 g 0  . levothyroxine (SYNTHROID, LEVOTHROID) 75 MCG tablet Take 75 mcg by mouth daily.    [name] losartan (COZAAR) 50 MG tablet Take 50 mg by mouth daily.    . metFORMIN (GLUCOPHAGE-XR) 500 MG 24 hr tablet Take 3 tablets (1,500 mg total) by mouth daily. 90 tablet 0  . Multiple Vitamins-Minerals (HAIR/SKIN/NAILS/BIOTIN PO) Take 1 capsule by mouth daily.    [name] omeprazole (PRILOSEC) 20 MG capsule Take 1 capsule by mouth daily.     . simvastatin (ZOCOR) 10 MG tablet Take 10 mg by mouth daily.    . SYMBICORT 80-4.5 MCG/ACT inhaler Inhale 2 puffs into the lungs Twice daily.    . vitamin B-12 (CYANOCOBALAMIN) 1000 MCG tablet Take 1 tablet (1,000 mcg total) by mouth daily. 30 tablet 0  . Vitamin D, Ergocalciferol, (DRISDOL) 1.25 MG (50000 UT) CAPS capsule Take 1 capsule (50,000 Units total) by mouth every 7 (seven) days. 4 capsule 0   No current facility-administered medications on file prior to visit.     PAST MEDICAL HISTORY: Past Medical History:  Diagnosis Date  . Anemia   . Anxiety   . Arthritis   . Asthma   . Chest pain   . Constipation   . Diabetes  mellitus   . Dyspnea   . Gallbladder problem   . GERD (gastroesophageal reflux disease)   . Hair loss   . Heart murmur   . Hiatal hernia   . Hip problem   . Hyperlipemia   . Hypertension   . IBS (irritable bowel syndrome)   . Joint pain   . Knee problem   . Leg edema   . Thyroid disease    hypothyroidism  . Tricuspid valve disorder     PAST SURGICAL HISTORY: Past Surgical History:  Procedure Laterality Date  . ABDOMINAL HYSTERECTOMY  3/98  . CHOLECYSTECTOMY  02/2006  . KNEE SURGERY    . MYOMECTOMY  12/93   uterine fibroids  . TMJ ARTHROPLASTY  8/82    SOCIAL HISTORY: Social History   Tobacco Use  . Smoking status: Never Smoker  . Smokeless tobacco: Never Used  Substance Use Topics  . Alcohol use: No  . Drug use: No  FAMILY HISTORY: Family History  Problem Relation Age of Onset  . Cancer Father        lung  . Diabetes Father   . Heart disease Father   . Stroke Father   . Kidney disease Father   . Heart disease Maternal Uncle   . Cancer Maternal Grandmother        colon  . Heart disease Maternal Grandfather   . Cancer Paternal Grandmother        colon    ROS: Review of Systems  Gastrointestinal: Negative for nausea and vomiting.  Musculoskeletal:       Negative for muscle weakness.  Endo/Heme/Allergies:       Negative for hypoglycemia.    PHYSICAL EXAM: Pt in no acute distress  RECENT LABS AND TESTS: BMET    Component Value Date/Time   NA 140 02/15/2019 1253   K 4.5 02/15/2019 1253   CL 100 02/15/2019 1253   CO2 23 02/15/2019 1253   GLUCOSE 98 02/15/2019 1253   BUN 12 02/15/2019 1253   CREATININE 0.67 02/15/2019 1253   CALCIUM 9.2 02/15/2019 1253   GFRNONAA 95 02/15/2019 1253   GFRAA 109 02/15/2019 1253   Lab Results  Component Value Date   HGBA1C 6.4 (H) 02/15/2019   HGBA1C 6.4 (H) 11/23/2018   HGBA1C 6.3 (H) 08/26/2018   Lab Results  Component Value Date   INSULIN 18.5 02/15/2019   INSULIN 24.9 11/23/2018   INSULIN 14.9  08/26/2018   CBC    Component Value Date/Time   WBC 7.3 08/26/2018 1055   RBC 4.45 08/26/2018 1055   HGB 12.6 08/26/2018 1055   HCT 39.0 08/26/2018 1055   MCV 88 08/26/2018 1055   MCH 28.3 08/26/2018 1055   MCHC 32.3 08/26/2018 1055   RDW 13.1 08/26/2018 1055   LYMPHSABS 2.1 08/26/2018 1055   EOSABS 0.3 08/26/2018 1055   BASOSABS 0.0 08/26/2018 1055   Iron/TIBC/Ferritin/ %Sat No results found for: IRON, TIBC, FERRITIN, IRONPCTSAT Lipid Panel     Component Value Date/Time   CHOL 131 08/26/2018 1055   TRIG 91 08/26/2018 1055   HDL 42 08/26/2018 1055   LDLCALC 71 08/26/2018 1055   Hepatic Function Panel     Component Value Date/Time   PROT 7.2 02/15/2019 1253   ALBUMIN 3.8 02/15/2019 1253   AST 14 02/15/2019 1253   ALT 13 02/15/2019 1253   ALKPHOS 74 02/15/2019 1253   BILITOT 0.4 02/15/2019 1253      Component Value Date/Time   TSH 1.740 11/23/2018 0947   TSH 2.320 08/26/2018 1055   Results for LILYBELLE, MAYEDA (MRN [id number]) as of 05/12/2019 08:56  Ref. Range 02/15/2019 12:53  Vitamin D, 25-Hydroxy Latest Ref Range: 30.0 - 100.0 ng/mL 30.9     I, Marcille Blanco, CMA, am acting as transcriptionist for Starlyn Skeans, MD I have reviewed the above documentation for accuracy and completeness, and I agree with the above. -Dennard Nip, MD

## 2019-05-22 ENCOUNTER — Other Ambulatory Visit (INDEPENDENT_AMBULATORY_CARE_PROVIDER_SITE_OTHER): Payer: Self-pay | Admitting: Family Medicine

## 2019-05-22 DIAGNOSIS — E559 Vitamin D deficiency, unspecified: Secondary | ICD-10-CM

## 2019-05-26 ENCOUNTER — Other Ambulatory Visit: Payer: Self-pay

## 2019-05-26 ENCOUNTER — Encounter (INDEPENDENT_AMBULATORY_CARE_PROVIDER_SITE_OTHER): Payer: Self-pay | Admitting: Family Medicine

## 2019-05-26 ENCOUNTER — Ambulatory Visit (INDEPENDENT_AMBULATORY_CARE_PROVIDER_SITE_OTHER): Payer: BC Managed Care – PPO | Admitting: Family Medicine

## 2019-05-26 DIAGNOSIS — Z6838 Body mass index (BMI) 38.0-38.9, adult: Secondary | ICD-10-CM | POA: Diagnosis not present

## 2019-05-26 DIAGNOSIS — E119 Type 2 diabetes mellitus without complications: Secondary | ICD-10-CM

## 2019-05-31 NOTE — Progress Notes (Signed)
Office: [phone]  /  Fax: [phone] TeleHealth Visit:  Cynthia Short has verbally consented to this TeleHealth visit today. The patient is located at home, the provider is located at the [facility] office. The participants in this visit include the listed provider and patient and any and all parties involved. The visit was conducted today via telephone. Cynthia Short was unable to use realtime audiovisual technology today (FaceTime failed) and the telehealth visit was conducted via telephone.  HPI:   Chief Complaint: OBESITY Cynthia Short is here to discuss her progress with her obesity treatment plan. She is on the Category 1 plan with modified lunch and she is following her eating plan approximately 75 % of the time. She states she is exercising 0 minutes 0 times per week. Cynthia Short continues to maintain her weight loss. She was allowed to journal for lunch, but she is often skipping this meal, which is likely dropping her metabolism. We were unable to weigh the patient today for this TeleHealth visit. She feels as if she has gained weight since her last visit. She has lost 4 lbs since starting treatment with [name].  Diabetes II Cynthia Short has a diagnosis of diabetes type II. Cynthia Short states her blood sugar is stable on metformin and diet. Her last A1c was well controlled at 6.4 She has been working on intensive lifestyle modifications including diet, exercise, and weight loss to help control her blood glucose levels. She denies nausea, vomiting or hypoglycemia.  ASSESSMENT AND PLAN:  Type 2 diabetes mellitus without complication, without long-term current use of insulin (HCC)  Class 2 severe obesity with serious comorbidity and body mass index (BMI) of 38.0 to 38.9 in adult, unspecified obesity type ([place])  PLAN:  Diabetes II Cynthia Short has been given extensive diabetes education by myself today including ideal fasting and post-prandial blood glucose readings, individual ideal Hgb A1c goals and  hypoglycemia prevention. We discussed the importance of good blood sugar control to decrease the likelihood of diabetic complications such as nephropathy, neuropathy, limb loss, blindness, coronary artery disease, and death. We discussed the importance of intensive lifestyle modification including diet, exercise and weight loss as the first line treatment for diabetes. Cynthia Short agrees to continue metformin and diet and we will check labs at the next in office visit. Cynthia Short will follow up at the agreed upon time.  I spent > than 50% of the 15 minute visit on counseling as documented in the note.  Obesity Cynthia Short is currently in the action stage of change. As such, her goal is to continue with weight loss efforts She has agreed to keep a food journal with 300 to 450 calories and 30+ grams of protein at lunch daily and follow the Category 2 plan Cynthia Short has been instructed to work up to a goal of 150 minutes of combined cardio and strengthening exercise per week for weight loss and overall health benefits. We discussed the following Behavioral Modification Strategies today: no skipping meals  Camera has agreed to follow up with our clinic in 2 weeks. She was informed of the importance of frequent follow up visits to maximize her success with intensive lifestyle modifications for her multiple health conditions.  ALLERGIES: Allergies  Allergen Reactions  . Penicillins   . Trifluoperazine     MEDICATIONS: Current Outpatient Medications on File Prior to Visit  Medication Sig Dispense Refill  . aspirin 81 MG tablet Take 81 mg by mouth daily.    [name] atenolol (TENORMIN) 25 MG tablet TAKE 1 TABLET  BY MOUTH ONCE DAILY 30 tablet 0  . estradiol (VIVELLE-DOT) 0.1 MG/24HR Place 1 patch onto the skin 2 (two) times a week.    . fluticasone (FLONASE) 50 MCG/ACT nasal spray Place 2 sprays into both nostrils daily. 16 g 0  . levothyroxine (SYNTHROID, LEVOTHROID) 75 MCG tablet Take 75 mcg by mouth daily.    [name] losartan  (COZAAR) 50 MG tablet Take 50 mg by mouth daily.    . metFORMIN (GLUCOPHAGE-XR) 500 MG 24 hr tablet Take 3 tablets (1,500 mg total) by mouth daily. 90 tablet 0  . Multiple Vitamins-Minerals (HAIR/SKIN/NAILS/BIOTIN PO) Take 1 capsule by mouth daily.    [name] omeprazole (PRILOSEC) 20 MG capsule Take 1 capsule by mouth daily.     . simvastatin (ZOCOR) 10 MG tablet Take 10 mg by mouth daily.    [name] sulfamethoxazole-trimethoprim (BACTRIM DS) 800-160 MG tablet Take 1 tablet by mouth 2 (two) times daily.    . SYMBICORT 80-4.5 MCG/ACT inhaler Inhale 2 puffs into the lungs Twice daily.    . vitamin B-12 (CYANOCOBALAMIN) 1000 MCG tablet Take 1 tablet (1,000 mcg total) by mouth daily. 30 tablet 0  . Vitamin D, Ergocalciferol, (DRISDOL) 1.25 MG (50000 UT) CAPS capsule Take 1 capsule (50,000 Units total) by mouth every 7 (seven) days. 4 capsule 0   No current facility-administered medications on file prior to visit.     PAST MEDICAL HISTORY: Past Medical History:  Diagnosis Date  . Anemia   . Anxiety   . Arthritis   . Asthma   . Chest pain   . Constipation   . Diabetes mellitus   . Dyspnea   . Gallbladder problem   . GERD (gastroesophageal reflux disease)   . Hair loss   . Heart murmur   . Hiatal hernia   . Hip problem   . Hyperlipemia   . Hypertension   . IBS (irritable bowel syndrome)   . Joint pain   . Knee problem   . Leg edema   . Thyroid disease    hypothyroidism  . Tricuspid valve disorder     PAST SURGICAL HISTORY: Past Surgical History:  Procedure Laterality Date  . ABDOMINAL HYSTERECTOMY  3/98  . CHOLECYSTECTOMY  02/2006  . KNEE SURGERY    . MYOMECTOMY  12/93   uterine fibroids  . TMJ ARTHROPLASTY  8/82    SOCIAL HISTORY: Social History   Tobacco Use  . Smoking status: Never Smoker  . Smokeless tobacco: Never Used  Substance Use Topics  . Alcohol use: No  . Drug use: No    FAMILY HISTORY: Family History  Problem Relation Age of Onset  . Cancer Father         lung  . Diabetes Father   . Heart disease Father   . Stroke Father   . Kidney disease Father   . Heart disease Maternal Uncle   . Cancer Maternal Grandmother        colon  . Heart disease Maternal Grandfather   . Cancer Paternal Grandmother        colon    ROS: Review of Systems  Constitutional: Negative for weight loss.  Gastrointestinal: Negative for nausea and vomiting.  Endo/Heme/Allergies:       Negative for hypoglycemia    PHYSICAL EXAM: Pt in no acute distress  RECENT LABS AND TESTS: BMET    Component Value Date/Time   NA 140 02/15/2019 1253   K 4.5 02/15/2019 1253   CL 100 02/15/2019 1253  CO2 23 02/15/2019 1253   GLUCOSE 98 02/15/2019 1253   BUN 12 02/15/2019 1253   CREATININE 0.67 02/15/2019 1253   CALCIUM 9.2 02/15/2019 1253   GFRNONAA 95 02/15/2019 1253   GFRAA 109 02/15/2019 1253   Lab Results  Component Value Date   HGBA1C 6.4 (H) 02/15/2019   HGBA1C 6.4 (H) 11/23/2018   HGBA1C 6.3 (H) 08/26/2018   Lab Results  Component Value Date   INSULIN 18.5 02/15/2019   INSULIN 24.9 11/23/2018   INSULIN 14.9 08/26/2018   CBC    Component Value Date/Time   WBC 7.3 08/26/2018 1055   RBC 4.45 08/26/2018 1055   HGB 12.6 08/26/2018 1055   HCT 39.0 08/26/2018 1055   MCV 88 08/26/2018 1055   MCH 28.3 08/26/2018 1055   MCHC 32.3 08/26/2018 1055   RDW 13.1 08/26/2018 1055   LYMPHSABS 2.1 08/26/2018 1055   EOSABS 0.3 08/26/2018 1055   BASOSABS 0.0 08/26/2018 1055   Iron/TIBC/Ferritin/ %Sat No results found for: IRON, TIBC, FERRITIN, IRONPCTSAT Lipid Panel     Component Value Date/Time   CHOL 131 08/26/2018 1055   TRIG 91 08/26/2018 1055   HDL 42 08/26/2018 1055   LDLCALC 71 08/26/2018 1055   Hepatic Function Panel     Component Value Date/Time   PROT 7.2 02/15/2019 1253   ALBUMIN 3.8 02/15/2019 1253   AST 14 02/15/2019 1253   ALT 13 02/15/2019 1253   ALKPHOS 74 02/15/2019 1253   BILITOT 0.4 02/15/2019 1253      Component Value Date/Time    TSH 1.740 11/23/2018 0947   TSH 2.320 08/26/2018 1055     Ref. Range 02/15/2019 12:53  Vitamin D, 25-Hydroxy Latest Ref Range: 30.0 - 100.0 ng/mL 30.9    I, Doreene Nest, am acting as Location manager for Dennard Nip, MD I have reviewed the above documentation for accuracy and completeness, and I agree with the above. -Dennard Nip, MD

## 2019-06-08 ENCOUNTER — Other Ambulatory Visit (INDEPENDENT_AMBULATORY_CARE_PROVIDER_SITE_OTHER): Payer: Self-pay | Admitting: Family Medicine

## 2019-06-08 DIAGNOSIS — E119 Type 2 diabetes mellitus without complications: Secondary | ICD-10-CM

## 2019-06-15 ENCOUNTER — Other Ambulatory Visit: Payer: Self-pay

## 2019-06-15 ENCOUNTER — Telehealth (INDEPENDENT_AMBULATORY_CARE_PROVIDER_SITE_OTHER): Payer: BC Managed Care – PPO | Admitting: Family Medicine

## 2019-06-15 ENCOUNTER — Encounter (INDEPENDENT_AMBULATORY_CARE_PROVIDER_SITE_OTHER): Payer: Self-pay | Admitting: Family Medicine

## 2019-06-15 DIAGNOSIS — Z6838 Body mass index (BMI) 38.0-38.9, adult: Secondary | ICD-10-CM | POA: Diagnosis not present

## 2019-06-15 DIAGNOSIS — E119 Type 2 diabetes mellitus without complications: Secondary | ICD-10-CM

## 2019-06-16 NOTE — Progress Notes (Signed)
Office: [phone]  /  Fax: [phone] TeleHealth Visit:  Cynthia Short has verbally consented to this TeleHealth visit today. The patient is located at home, the provider is located at the [facility] office. The participants in this visit include the listed provider and patient. Cynthia Short was unable to use [email or web address] today and the telehealth visit was conducted via telephone.  HPI:   Chief Complaint: OBESITY Cynthia Short is here to discuss her progress with her obesity treatment plan. She is on the Category 1 plan and is following her eating plan approximately 75 % of the time. She states she is walking 60 minutes 4 times per week. Cynthia Short feels that she has gained 1 to 2 pounds since her last visit. She had some emotional eating around the anniversary of her father's death, but feels that she is ready to get back on track. She would like to stay on her Category 1 plan.  We were unable to weigh the patient today for this TeleHealth visit. She feels as if she has gained weight since her last visit. She has lost 5 lbs since starting treatment with [name].  Diabetes II Cynthia Short has a diagnosis of diabetes type II. Cynthia Short's last A1c was 6.4 on 02/15/19 and she denies any hypoglycemic episodes. Her blood sugar is ranging from 108 to 112 fasting and her 2 hour post prandial readings are in the 130's. She has been working on intensive lifestyle modifications including diet, exercise, and weight loss to help control her blood glucose levels. Cynthia Short denies nausea and vomiting.  ASSESSMENT AND PLAN:  Type 2 diabetes mellitus without complication, without long-term current use of insulin (HCC)  Class 2 severe obesity with serious comorbidity and body mass index (BMI) of 38.0 to 38.9 in adult, unspecified obesity type ([place])  PLAN:  Diabetes II Cynthia Short has been given extensive diabetes education by myself today including ideal fasting and post-prandial blood glucose readings, individual ideal Hgb A1c  goals, and hypoglycemia prevention. We discussed the importance of good blood sugar control to decrease the likelihood of diabetic complications such as nephropathy, neuropathy, limb loss, blindness, coronary artery disease, and death. We discussed the importance of intensive lifestyle modification including diet, exercise, and weight loss as the first line treatment for diabetes. Cynthia Short agrees to continue her diet, exercise, and metformin. We may consider a GLP-1 in the future to assist with weight loss and blood sugar control. Cynthia Short will follow up at the agreed upon time in 2 weeks.  I spent > than 50% of the 15 minute visit on counseling as documented in the note.  Obesity Cynthia Short is currently in the action stage of change. As such, her goal is to continue with weight loss efforts. She has agreed to follow the Category 1 plan. Cynthia Short has been instructed to work up to a goal of 150 minutes of combined cardio and strengthening exercise per week for weight loss and overall health benefits. We discussed the following Behavioral Modification Strategies today: work on meal planning and easy cooking plans, better snacking choices, and emotional eating strategies.  Cynthia Short has agreed to follow up with our clinic in 2 weeks. She was informed of the importance of frequent follow up visits to maximize her success with intensive lifestyle modifications for her multiple health conditions.  ALLERGIES: Allergies  Allergen Reactions  . Penicillins   . Trifluoperazine     MEDICATIONS: Current Outpatient Medications on File Prior to Visit  Medication Sig Dispense Refill  . aspirin 81 MG  tablet Take 81 mg by mouth daily.    [name] atenolol (TENORMIN) 25 MG tablet TAKE 1 TABLET BY MOUTH ONCE DAILY 30 tablet 0  . estradiol (VIVELLE-DOT) 0.1 MG/24HR Place 1 patch onto the skin 2 (two) times a week.    . fluticasone (FLONASE) 50 MCG/ACT nasal spray Place 2 sprays into both nostrils daily. 16 g 0  . levothyroxine  (SYNTHROID, LEVOTHROID) 75 MCG tablet Take 75 mcg by mouth daily.    [name] losartan (COZAAR) 50 MG tablet Take 50 mg by mouth daily.    . metFORMIN (GLUCOPHAGE-XR) 500 MG 24 hr tablet Take 3 tablets (1,500 mg total) by mouth daily. 90 tablet 0  . Multiple Vitamins-Minerals (HAIR/SKIN/NAILS/BIOTIN PO) Take 1 capsule by mouth daily.    [name] omeprazole (PRILOSEC) 20 MG capsule Take 1 capsule by mouth daily.     . simvastatin (ZOCOR) 10 MG tablet Take 10 mg by mouth daily.    [name] sulfamethoxazole-trimethoprim (BACTRIM DS) 800-160 MG tablet Take 1 tablet by mouth 2 (two) times daily.    . SYMBICORT 80-4.5 MCG/ACT inhaler Inhale 2 puffs into the lungs Twice daily.    . vitamin B-12 (CYANOCOBALAMIN) 1000 MCG tablet Take 1 tablet (1,000 mcg total) by mouth daily. 30 tablet 0  . Vitamin D, Ergocalciferol, (DRISDOL) 1.25 MG (50000 UT) CAPS capsule Take 1 capsule (50,000 Units total) by mouth every 7 (seven) days. 4 capsule 0   No current facility-administered medications on file prior to visit.     PAST MEDICAL HISTORY: Past Medical History:  Diagnosis Date  . Anemia   . Anxiety   . Arthritis   . Asthma   . Chest pain   . Constipation   . Diabetes mellitus   . Dyspnea   . Gallbladder problem   . GERD (gastroesophageal reflux disease)   . Hair loss   . Heart murmur   . Hiatal hernia   . Hip problem   . Hyperlipemia   . Hypertension   . IBS (irritable bowel syndrome)   . Joint pain   . Knee problem   . Leg edema   . Thyroid disease    hypothyroidism  . Tricuspid valve disorder     PAST SURGICAL HISTORY: Past Surgical History:  Procedure Laterality Date  . ABDOMINAL HYSTERECTOMY  3/98  . CHOLECYSTECTOMY  02/2006  . KNEE SURGERY    . MYOMECTOMY  12/93   uterine fibroids  . TMJ ARTHROPLASTY  8/82    SOCIAL HISTORY: Social History   Tobacco Use  . Smoking status: Never Smoker  . Smokeless tobacco: Never Used  Substance Use Topics  . Alcohol use: No  . Drug use: No    FAMILY  HISTORY: Family History  Problem Relation Age of Onset  . Cancer Father        lung  . Diabetes Father   . Heart disease Father   . Stroke Father   . Kidney disease Father   . Heart disease Maternal Uncle   . Cancer Maternal Grandmother        colon  . Heart disease Maternal Grandfather   . Cancer Paternal Grandmother        colon    ROS: Review of Systems  Gastrointestinal: Negative for nausea and vomiting.  Musculoskeletal:       Negative for muscle weakness.    PHYSICAL EXAM: Pt in no acute distress  RECENT LABS AND TESTS: BMET    Component Value Date/Time   NA 140 02/15/2019  1253   K 4.5 02/15/2019 1253   CL 100 02/15/2019 1253   CO2 23 02/15/2019 1253   GLUCOSE 98 02/15/2019 1253   BUN 12 02/15/2019 1253   CREATININE 0.67 02/15/2019 1253   CALCIUM 9.2 02/15/2019 1253   GFRNONAA 95 02/15/2019 1253   GFRAA 109 02/15/2019 1253   Lab Results  Component Value Date   HGBA1C 6.4 (H) 02/15/2019   HGBA1C 6.4 (H) 11/23/2018   HGBA1C 6.3 (H) 08/26/2018   Lab Results  Component Value Date   INSULIN 18.5 02/15/2019   INSULIN 24.9 11/23/2018   INSULIN 14.9 08/26/2018   CBC    Component Value Date/Time   WBC 7.3 08/26/2018 1055   RBC 4.45 08/26/2018 1055   HGB 12.6 08/26/2018 1055   HCT 39.0 08/26/2018 1055   MCV 88 08/26/2018 1055   MCH 28.3 08/26/2018 1055   MCHC 32.3 08/26/2018 1055   RDW 13.1 08/26/2018 1055   LYMPHSABS 2.1 08/26/2018 1055   EOSABS 0.3 08/26/2018 1055   BASOSABS 0.0 08/26/2018 1055   Iron/TIBC/Ferritin/ %Sat No results found for: IRON, TIBC, FERRITIN, IRONPCTSAT Lipid Panel     Component Value Date/Time   CHOL 131 08/26/2018 1055   TRIG 91 08/26/2018 1055   HDL 42 08/26/2018 1055   LDLCALC 71 08/26/2018 1055   Hepatic Function Panel     Component Value Date/Time   PROT 7.2 02/15/2019 1253   ALBUMIN 3.8 02/15/2019 1253   AST 14 02/15/2019 1253   ALT 13 02/15/2019 1253   ALKPHOS 74 02/15/2019 1253   BILITOT 0.4 02/15/2019  1253      Component Value Date/Time   TSH 1.740 11/23/2018 0947   TSH 2.320 08/26/2018 1055   Results for DELESA, KAWA (MRN [id number]) as of 06/16/2019 12:34  Ref. Range 02/15/2019 12:53  Vitamin D, 25-Hydroxy Latest Ref Range: 30.0 - 100.0 ng/mL 30.9     I, Marcille Blanco, CMA, am acting as transcriptionist for Starlyn Skeans, MD I have reviewed the above documentation for accuracy and completeness, and I agree with the above. -Dennard Nip, MD

## 2019-06-23 ENCOUNTER — Other Ambulatory Visit (INDEPENDENT_AMBULATORY_CARE_PROVIDER_SITE_OTHER): Payer: Self-pay | Admitting: Family Medicine

## 2019-06-23 DIAGNOSIS — E559 Vitamin D deficiency, unspecified: Secondary | ICD-10-CM

## 2019-06-30 ENCOUNTER — Telehealth (INDEPENDENT_AMBULATORY_CARE_PROVIDER_SITE_OTHER): Payer: BC Managed Care – PPO | Admitting: Family Medicine

## 2019-07-18 ENCOUNTER — Other Ambulatory Visit (INDEPENDENT_AMBULATORY_CARE_PROVIDER_SITE_OTHER): Payer: Self-pay | Admitting: Family Medicine

## 2019-07-18 DIAGNOSIS — E559 Vitamin D deficiency, unspecified: Secondary | ICD-10-CM

## 2019-07-20 ENCOUNTER — Telehealth (INDEPENDENT_AMBULATORY_CARE_PROVIDER_SITE_OTHER): Payer: BC Managed Care – PPO | Admitting: Family Medicine

## 2019-07-20 ENCOUNTER — Other Ambulatory Visit: Payer: Self-pay

## 2019-07-20 ENCOUNTER — Encounter (INDEPENDENT_AMBULATORY_CARE_PROVIDER_SITE_OTHER): Payer: Self-pay | Admitting: Family Medicine

## 2019-07-20 DIAGNOSIS — F3289 Other specified depressive episodes: Secondary | ICD-10-CM | POA: Diagnosis not present

## 2019-07-20 DIAGNOSIS — E559 Vitamin D deficiency, unspecified: Secondary | ICD-10-CM | POA: Diagnosis not present

## 2019-07-20 DIAGNOSIS — E538 Deficiency of other specified B group vitamins: Secondary | ICD-10-CM

## 2019-07-20 DIAGNOSIS — E119 Type 2 diabetes mellitus without complications: Secondary | ICD-10-CM

## 2019-07-20 DIAGNOSIS — Z6839 Body mass index (BMI) 39.0-39.9, adult: Secondary | ICD-10-CM

## 2019-07-20 MED ORDER — BUPROPION HCL ER (SR) 150 MG PO TB12: 150.0000 mg | ORAL_TABLET | Freq: Every day | ORAL | 0 refills | Status: DC

## 2019-07-20 MED ORDER — METFORMIN HCL ER 500 MG PO TB24: 1500.0000 mg | ORAL_TABLET | Freq: Every day | ORAL | 0 refills | Status: DC

## 2019-07-20 MED ORDER — VITAMIN B-12 1000 MCG PO TABS: 1000.0000 ug | ORAL_TABLET | Freq: Every day | ORAL | 0 refills | Status: DC

## 2019-07-20 MED ORDER — VITAMIN D (ERGOCALCIFEROL) 1.25 MG (50000 UNIT) PO CAPS: 50000.0000 [IU] | ORAL_CAPSULE | ORAL | 0 refills | Status: DC

## 2019-07-21 NOTE — Progress Notes (Signed)
Office: [phone]  /  Fax: [phone] TeleHealth Visit:  Cynthia Short has verbally consented to this TeleHealth visit today. The patient is located at home, the provider is located at the [facility] office. The participants in this visit include the listed provider and patient. Cynthia Short was unable to use realtime audiovisual technology today and the telehealth visit was conducted via telephone.   HPI:   Chief Complaint: OBESITY Cynthia Short is here to discuss her progress with her obesity treatment plan. She is on the Category 1 plan and is following her eating plan approximately 50 % of the time. She states she is exercising 0 minutes 0 times per week. Cynthia Short states she has gained weight and is at 214 lbs today. She has not been following her plan closely recently. She fell and hurt her knee, so she has decreased walking. She states her blood pressure was 135/82. We were unable to weigh the patient today for this TeleHealth visit. She feels as if she has gained weight since her last visit. She has lost 5 lbs since starting treatment with [name].  Diabetes II Cynthia Short has a diagnosis of diabetes type II. Cynthia Short states her BGs are well controlled. Her fasting BGs mostly range around 110. She denies nausea, vomiting, or hypoglycemia. She has gotten a bit off track with her eating, but is ready to get back to her diabetes mellitus eating plan. Last A1c was 6.4. She has been working on intensive lifestyle modifications including diet, exercise, and weight loss to help control her blood glucose levels.  Vitamin D Deficiency Cynthia Short has a diagnosis of vitamin D deficiency. She is stable on prescription Vit D, but level is not yet at goal. She denies nausea, vomiting or muscle weakness.  Vitamin B12 Deficiency Cynthia Short has a diagnosis of B12 insufficiency. She is stable on B12 supplementation. She notes no change in fatigue. She is not a vegetarian and does not have a previous diagnosis of  pernicious anemia. She does not have a history of weight loss surgery.   Depression with Emotional Eating Behaviors Cynthia Short notes an increase in comfort eating in the last few weeks and is unsure of why. Cynthia Short has a high level of stress but this is not new. Cynthia Short struggles with emotional eating and using food for comfort to the extent that it is negatively impacting her health. She often snacks when she is not hungry. Cynthia Short sometimes feels she is out of control and then feels guilty that she made poor food choices. She has been working on behavior modification techniques to help reduce her emotional eating and has been somewhat successful. She shows no sign of suicidal or homicidal ideations.  Depression screen Cynthia Short 2/9 09/09/2018 08/26/2018  Decreased Interest 0 2  Down, Depressed, Hopeless 0 2  PHQ - 2 Score 0 4  Altered sleeping 0 3  Tired, decreased energy 0 3  Change in appetite 0 2  Feeling bad or failure about yourself  0 3  Trouble concentrating 0 0  Moving slowly or fidgety/restless 0 1  Suicidal thoughts 0 0  PHQ-9 Score 0 16  Difficult doing work/chores - Somewhat difficult    ASSESSMENT AND PLAN:  Vitamin D deficiency - Plan: Vitamin D, Ergocalciferol, (DRISDOL) 1.25 MG (50000 UT) CAPS capsule  B12 nutritional deficiency - Plan: vitamin B-12 (CYANOCOBALAMIN) 1000 MCG tablet  Type 2 diabetes mellitus without complication, without long-term current use of insulin (HCC) - Plan: metFORMIN (GLUCOPHAGE-XR) 500 MG 24 hr tablet  Other  depression - Plan: buPROPion (WELLBUTRIN SR) 150 MG 12 hr tablet  Class 2 severe obesity with serious comorbidity and body mass index (BMI) of 39.0 to 39.9 in adult, unspecified obesity type ([place])  PLAN:  Diabetes II Cynthia Short has been given extensive diabetes education by myself today including ideal fasting and post-prandial blood glucose readings, individual ideal Hgb A1c goals and hypoglycemia prevention. We discussed the importance of good blood sugar  control to decrease the likelihood of diabetic complications such as nephropathy, neuropathy, limb loss, blindness, coronary artery disease, and death. We discussed the importance of intensive lifestyle modification including diet, exercise and weight loss as the first line treatment for diabetes. Cynthia Short agrees to continue taking metformin 500 mg PO TID #90 and we will refill for 1 month, and she will continue with diet and exercise. Cynthia Short agrees to follow up with our clinic in 3 weeks.  Vitamin D Deficiency Cynthia Short was informed that low vitamin D levels contributes to fatigue and are associated with obesity, breast, and colon cancer. Cynthia Short agrees to continue taking prescription Vit D 50,000 IU every week #4 and we will refill for 1 month. She will follow up for routine testing of vitamin D, at least 2-3 times per year. She was informed of the risk of over-replacement of vitamin D and agrees to not increase her dose unless she discusses this with [name] first. Cynthia Short agrees to follow up with our clinic in 3 weeks.  Vitamin B12 Deficiency Cynthia Short will work on increasing B12 rich foods in her diet. Cynthia Short agrees to continue taking Cyanocobalamin 1,000 mcg PO daily #30 and we will refill for 1 month. Cynthia Short agrees to follow up with our clinic in 3 weeks.   Depression with Emotional Eating Behaviors We discussed behavior modification techniques today to help Cynthia Short deal with her emotional eating and depression. Cynthia Short agrees to start Wellbutrin SR 150 mg q AM #30 with no refills. Cynthia Short agrees to follow up with our clinic in 3 weeks.  I spent > than 50% of the 25 minute visit on counseling as documented in the note.  Obesity Cynthia Short is currently in the action stage of change. As such, her goal is to continue with weight loss efforts She has agreed to follow the Category 1 plan Cynthia Short has been instructed to work up to a goal of 150 minutes of combined cardio and strengthening exercise per week for weight loss and overall  health benefits. We discussed the following Behavioral Modification Strategies today: work on meal planning and easy cooking plans, holiday eating strategies  and emotional eating strategies   Parisha has agreed to follow up with our clinic in 3 weeks. She was informed of the importance of frequent follow up visits to maximize her success with intensive lifestyle modifications for her multiple health conditions.  ALLERGIES: Allergies  Allergen Reactions  . Penicillins   . Trifluoperazine     MEDICATIONS: Current Outpatient Medications on File Prior to Visit  Medication Sig Dispense Refill  . aspirin 81 MG tablet Take 81 mg by mouth daily.    [name] atenolol (TENORMIN) 25 MG tablet TAKE 1 TABLET BY MOUTH ONCE DAILY 30 tablet 0  . estradiol (VIVELLE-DOT) 0.1 MG/24HR Place 1 patch onto the skin 2 (two) times a week.    . fluticasone (FLONASE) 50 MCG/ACT nasal spray Place 2 sprays into both nostrils daily. 16 g 0  . levothyroxine (SYNTHROID, LEVOTHROID) 75 MCG tablet Take 75 mcg by mouth daily.    [name] losartan (COZAAR) 50  MG tablet Take 50 mg by mouth daily.    . Multiple Vitamins-Minerals (HAIR/SKIN/NAILS/BIOTIN PO) Take 1 capsule by mouth daily.    [name] omeprazole (PRILOSEC) 20 MG capsule Take 1 capsule by mouth daily.     . simvastatin (ZOCOR) 10 MG tablet Take 10 mg by mouth daily.    . SYMBICORT 80-4.5 MCG/ACT inhaler Inhale 2 puffs into the lungs Twice daily.     No current facility-administered medications on file prior to visit.     PAST MEDICAL HISTORY: Past Medical History:  Diagnosis Date  . Anemia   . Anxiety   . Arthritis   . Asthma   . Chest pain   . Constipation   . Diabetes mellitus   . Dyspnea   . Gallbladder problem   . GERD (gastroesophageal reflux disease)   . Hair loss   . Heart murmur   . Hiatal hernia   . Hip problem   . Hyperlipemia   . Hypertension   . IBS (irritable bowel syndrome)   . Joint pain   . Knee problem   . Leg edema   . Thyroid disease     hypothyroidism  . Tricuspid valve disorder     PAST SURGICAL HISTORY: Past Surgical History:  Procedure Laterality Date  . ABDOMINAL HYSTERECTOMY  3/98  . CHOLECYSTECTOMY  02/2006  . KNEE SURGERY    . MYOMECTOMY  12/93   uterine fibroids  . TMJ ARTHROPLASTY  8/82    SOCIAL HISTORY: Social History   Tobacco Use  . Smoking status: Never Smoker  . Smokeless tobacco: Never Used  Substance Use Topics  . Alcohol use: No  . Drug use: No    FAMILY HISTORY: Family History  Problem Relation Age of Onset  . Cancer Father        lung  . Diabetes Father   . Heart disease Father   . Stroke Father   . Kidney disease Father   . Heart disease Maternal Uncle   . Cancer Maternal Grandmother        colon  . Heart disease Maternal Grandfather   . Cancer Paternal Grandmother        colon    ROS: Review of Systems  Constitutional: Positive for malaise/fatigue. Negative for weight loss.  Gastrointestinal: Negative for nausea and vomiting.  Musculoskeletal:       Negative muscle weakness + Knee pain  Endo/Heme/Allergies:       Negative hypoglycemia  Psychiatric/Behavioral: Positive for depression. Negative for suicidal ideas.    PHYSICAL EXAM: Pt in no acute distress  RECENT LABS AND TESTS: BMET    Component Value Date/Time   NA 140 02/15/2019 1253   K 4.5 02/15/2019 1253   CL 100 02/15/2019 1253   CO2 23 02/15/2019 1253   GLUCOSE 98 02/15/2019 1253   BUN 12 02/15/2019 1253   CREATININE 0.67 02/15/2019 1253   CALCIUM 9.2 02/15/2019 1253   GFRNONAA 95 02/15/2019 1253   GFRAA 109 02/15/2019 1253   Lab Results  Component Value Date   HGBA1C 6.4 (H) 02/15/2019   HGBA1C 6.4 (H) 11/23/2018   HGBA1C 6.3 (H) 08/26/2018   Lab Results  Component Value Date   INSULIN 18.5 02/15/2019   INSULIN 24.9 11/23/2018   INSULIN 14.9 08/26/2018   CBC    Component Value Date/Time   WBC 7.3 08/26/2018 1055   RBC 4.45 08/26/2018 1055   HGB 12.6 08/26/2018 1055   HCT 39.0  08/26/2018 1055   MCV 88  08/26/2018 1055   MCH 28.3 08/26/2018 1055   MCHC 32.3 08/26/2018 1055   RDW 13.1 08/26/2018 1055   LYMPHSABS 2.1 08/26/2018 1055   EOSABS 0.3 08/26/2018 1055   BASOSABS 0.0 08/26/2018 1055   Iron/TIBC/Ferritin/ %Sat No results found for: IRON, TIBC, FERRITIN, IRONPCTSAT Lipid Panel     Component Value Date/Time   CHOL 131 08/26/2018 1055   TRIG 91 08/26/2018 1055   HDL 42 08/26/2018 1055   LDLCALC 71 08/26/2018 1055   Hepatic Function Panel     Component Value Date/Time   PROT 7.2 02/15/2019 1253   ALBUMIN 3.8 02/15/2019 1253   AST 14 02/15/2019 1253   ALT 13 02/15/2019 1253   ALKPHOS 74 02/15/2019 1253   BILITOT 0.4 02/15/2019 1253      Component Value Date/Time   TSH 1.740 11/23/2018 0947   TSH 2.320 08/26/2018 1055      I, Trixie Dredge, am acting as Location manager for Dennard Nip, MD I have reviewed the above documentation for accuracy and completeness, and I agree with the above. -Dennard Nip, MD

## 2019-08-10 ENCOUNTER — Other Ambulatory Visit (INDEPENDENT_AMBULATORY_CARE_PROVIDER_SITE_OTHER): Payer: Self-pay | Admitting: Family Medicine

## 2019-08-10 ENCOUNTER — Telehealth (INDEPENDENT_AMBULATORY_CARE_PROVIDER_SITE_OTHER): Payer: BC Managed Care – PPO | Admitting: Family Medicine

## 2019-08-10 DIAGNOSIS — E559 Vitamin D deficiency, unspecified: Secondary | ICD-10-CM

## 2019-08-11 ENCOUNTER — Telehealth (INDEPENDENT_AMBULATORY_CARE_PROVIDER_SITE_OTHER): Payer: BC Managed Care – PPO | Admitting: Family Medicine

## 2019-08-11 ENCOUNTER — Encounter (INDEPENDENT_AMBULATORY_CARE_PROVIDER_SITE_OTHER): Payer: Self-pay | Admitting: Family Medicine

## 2019-08-11 ENCOUNTER — Other Ambulatory Visit: Payer: Self-pay

## 2019-08-11 ENCOUNTER — Encounter (INDEPENDENT_AMBULATORY_CARE_PROVIDER_SITE_OTHER): Payer: Self-pay

## 2019-08-11 DIAGNOSIS — E559 Vitamin D deficiency, unspecified: Secondary | ICD-10-CM

## 2019-08-11 DIAGNOSIS — E119 Type 2 diabetes mellitus without complications: Secondary | ICD-10-CM

## 2019-08-11 DIAGNOSIS — Z6839 Body mass index (BMI) 39.0-39.9, adult: Secondary | ICD-10-CM | POA: Diagnosis not present

## 2019-08-11 MED ORDER — VITAMIN D (ERGOCALCIFEROL) 1.25 MG (50000 UNIT) PO CAPS: 50000.0000 [IU] | ORAL_CAPSULE | ORAL | 0 refills | Status: DC

## 2019-08-12 ENCOUNTER — Other Ambulatory Visit (INDEPENDENT_AMBULATORY_CARE_PROVIDER_SITE_OTHER): Payer: Self-pay | Admitting: Family Medicine

## 2019-08-12 DIAGNOSIS — E538 Deficiency of other specified B group vitamins: Secondary | ICD-10-CM

## 2019-08-12 DIAGNOSIS — F3289 Other specified depressive episodes: Secondary | ICD-10-CM

## 2019-08-15 NOTE — Progress Notes (Signed)
Office: [phone]  /  Fax: [phone] TeleHealth Visit:  Cynthia Short has verbally consented to this TeleHealth visit today. The patient is located at home, the provider is located at the [facility] office. The participants in this visit include the listed provider and patient. The visit was conducted today via telephone call (FaceTime failed - changed to telephone call).  HPI:   Chief Complaint: OBESITY Cynthia Short is here to discuss her progress with her obesity treatment plan. She is on the Category 2 plan and is following her eating plan approximately 50% of the time. She states she is exercising 0 minutes 0 times per week. [name] continues to work on weight loss and feels she is down 1-2 lbs, but is deviating significantly from her eating plan. She would like to discuss other eating plans. We were unable to weigh the patient today for this TeleHealth visit. She feels as if she has lost weight since her last visit. She has lost 5 lbs since starting treatment with [name].  Vitamin D deficiency Cynthia Short has a diagnosis of Vitamin D deficiency. She is currently stable on prescription Vit D and denies nausea, vomiting or muscle weakness.  Diabetes Mellitus Cynthia Short has a diagnosis of diabetes mellitus. Cynthia Short states fasting blood sugars average between 105 and 112 on metformin and diet. Last A1c was 6.4 on 02/15/2019. She has been working on intensive lifestyle modifications including diet, exercise, and weight loss to help control her blood glucose levels. No nausea, vomiting, or hypoglycemia. She reports decreased polyphagia.  ASSESSMENT AND PLAN:  Vitamin D deficiency - Plan: Vitamin D, Ergocalciferol, (DRISDOL) 1.25 MG (50000 UT) CAPS capsule  Type 2 diabetes mellitus without complication, without long-term current use of insulin (HCC)  Class 2 severe obesity with serious comorbidity and body mass index (BMI) of 39.0 to 39.9 in adult, unspecified obesity type ([place])  PLAN:   Vitamin D Deficiency Cynthia Short was informed that low Vitamin D levels contributes to fatigue and are associated with obesity, breast, and colon cancer. She agrees to continue to take prescription Vit D @ 50,000 IU every week #4 with 0 refills and will follow-up for routine testing of Vitamin D next week. She was informed of the risk of over-replacement of Vitamin D and agrees to not increase her dose unless she discusses this with [name] first. Cynthia Short agrees to follow-up with our clinic in 3 weeks.  Diabetes Mellitus Cynthia Short has been given extensive diabetes education by myself today including ideal fasting and post-prandial blood glucose readings, individual ideal HgA1c goals  and hypoglycemia prevention. We discussed the importance of good blood sugar control to decrease the likelihood of diabetic complications such as nephropathy, neuropathy, limb loss, blindness, coronary artery disease, and death. We discussed the importance of intensive lifestyle modification including diet, exercise and weight loss as the first line treatment for diabetes. Cynthia Short agrees to continue metformin and her diet. She will have labs checked at her next visit.   I spent > than 50% of the 25 minute visit on counseling as documented in the note.  Obesity Cynthia Short is currently in the action stage of change. As such, her goal is to continue with weight loss efforts. She has agreed to keep a food journal with 1200-1400 calories and 80+ grams of protein daily. Cynthia Short has been instructed to work up to a goal of 150 minutes of combined cardio and strengthening exercise per week for weight loss and overall health benefits. We discussed the following Behavioral Modification Strategies  today: increasing lean protein intake and keep a strict food journal.  Cynthia Short has agreed to follow-up with our clinic in 3 weeks. She was informed of the importance of frequent follow-up visits to maximize her success with intensive lifestyle modifications for her  multiple health conditions.  ALLERGIES: Allergies  Allergen Reactions  . Penicillins   . Trifluoperazine     MEDICATIONS: Current Outpatient Medications on File Prior to Visit  Medication Sig Dispense Refill  . aspirin 81 MG tablet Take 81 mg by mouth daily.    Cynthia Short atenolol (TENORMIN) 25 MG tablet TAKE 1 TABLET BY MOUTH ONCE DAILY 30 tablet 0  . buPROPion (WELLBUTRIN SR) 150 MG 12 hr tablet Take 1 tablet (150 mg total) by mouth daily. 30 tablet 0  . estradiol (VIVELLE-DOT) 0.1 MG/24HR Place 1 patch onto the skin 2 (two) times a week.    . fluticasone (FLONASE) 50 MCG/ACT nasal spray Place 2 sprays into both nostrils daily. 16 g 0  . levothyroxine (SYNTHROID, LEVOTHROID) 75 MCG tablet Take 75 mcg by mouth daily.    Cynthia Short losartan (COZAAR) 50 MG tablet Take 50 mg by mouth daily.    . metFORMIN (GLUCOPHAGE-XR) 500 MG 24 hr tablet Take 3 tablets (1,500 mg total) by mouth daily. 90 tablet 0  . Multiple Vitamins-Minerals (HAIR/SKIN/NAILS/BIOTIN PO) Take 1 capsule by mouth daily.    Cynthia Short omeprazole (PRILOSEC) 20 MG capsule Take 1 capsule by mouth daily.     . simvastatin (ZOCOR) 10 MG tablet Take 10 mg by mouth daily.    . SYMBICORT 80-4.5 MCG/ACT inhaler Inhale 2 puffs into the lungs Twice daily.    . vitamin B-12 (CYANOCOBALAMIN) 1000 MCG tablet Take 1 tablet (1,000 mcg total) by mouth daily. 30 tablet 0   No current facility-administered medications on file prior to visit.     PAST MEDICAL HISTORY: Past Medical History:  Diagnosis Date  . Anemia   . Anxiety   . Arthritis   . Asthma   . Chest pain   . Constipation   . Diabetes mellitus   . Dyspnea   . Gallbladder problem   . GERD (gastroesophageal reflux disease)   . Hair loss   . Heart murmur   . Hiatal hernia   . Hip problem   . Hyperlipemia   . Hypertension   . IBS (irritable bowel syndrome)   . Joint pain   . Knee problem   . Leg edema   . Thyroid disease    hypothyroidism  . Tricuspid valve disorder     PAST SURGICAL  HISTORY: Past Surgical History:  Procedure Laterality Date  . ABDOMINAL HYSTERECTOMY  3/98  . CHOLECYSTECTOMY  02/2006  . KNEE SURGERY    . MYOMECTOMY  12/93   uterine fibroids  . TMJ ARTHROPLASTY  8/82    SOCIAL HISTORY: Social History   Tobacco Use  . Smoking status: Never Smoker  . Smokeless tobacco: Never Used  Substance Use Topics  . Alcohol use: No  . Drug use: No    FAMILY HISTORY: Family History  Problem Relation Age of Onset  . Cancer Father        lung  . Diabetes Father   . Heart disease Father   . Stroke Father   . Kidney disease Father   . Heart disease Maternal Uncle   . Cancer Maternal Grandmother        colon  . Heart disease Maternal Grandfather   . Cancer Paternal Grandmother  colon   ROS: Review of Systems  Gastrointestinal: Negative for nausea and vomiting.  Musculoskeletal:       Negative for muscle weakness.  Endo/Heme/Allergies:       Negative for hypoglycemia. Positive for decreased polyphagia.   PHYSICAL EXAM: Pt in no acute distress  RECENT LABS AND TESTS: BMET    Component Value Date/Time   NA 140 02/15/2019 1253   K 4.5 02/15/2019 1253   CL 100 02/15/2019 1253   CO2 23 02/15/2019 1253   GLUCOSE 98 02/15/2019 1253   BUN 12 02/15/2019 1253   CREATININE 0.67 02/15/2019 1253   CALCIUM 9.2 02/15/2019 1253   GFRNONAA 95 02/15/2019 1253   GFRAA 109 02/15/2019 1253   Lab Results  Component Value Date   HGBA1C 6.4 (H) 02/15/2019   HGBA1C 6.4 (H) 11/23/2018   HGBA1C 6.3 (H) 08/26/2018   Lab Results  Component Value Date   INSULIN 18.5 02/15/2019   INSULIN 24.9 11/23/2018   INSULIN 14.9 08/26/2018   CBC    Component Value Date/Time   WBC 7.3 08/26/2018 1055   RBC 4.45 08/26/2018 1055   HGB 12.6 08/26/2018 1055   HCT 39.0 08/26/2018 1055   MCV 88 08/26/2018 1055   MCH 28.3 08/26/2018 1055   MCHC 32.3 08/26/2018 1055   RDW 13.1 08/26/2018 1055   LYMPHSABS 2.1 08/26/2018 1055   EOSABS 0.3 08/26/2018 1055    BASOSABS 0.0 08/26/2018 1055   Iron/TIBC/Ferritin/ %Sat No results found for: IRON, TIBC, FERRITIN, IRONPCTSAT Lipid Panel     Component Value Date/Time   CHOL 131 08/26/2018 1055   TRIG 91 08/26/2018 1055   HDL 42 08/26/2018 1055   LDLCALC 71 08/26/2018 1055   Hepatic Function Panel     Component Value Date/Time   PROT 7.2 02/15/2019 1253   ALBUMIN 3.8 02/15/2019 1253   AST 14 02/15/2019 1253   ALT 13 02/15/2019 1253   ALKPHOS 74 02/15/2019 1253   BILITOT 0.4 02/15/2019 1253      Component Value Date/Time   TSH 1.740 11/23/2018 0947   TSH 2.320 08/26/2018 1055   Results for EMERITA, BONIS (MRN [id number]) as of 08/15/2019 14:14  Ref. Range 02/15/2019 12:53  Vitamin D, 25-Hydroxy Latest Ref Range: 30.0 - 100.0 ng/mL 30.9   I, Michaelene Song, am acting as Location manager for Dennard Nip, MD I have reviewed the above documentation for accuracy and completeness, and I agree with the above. -Dennard Nip, MD

## 2019-09-04 ENCOUNTER — Other Ambulatory Visit (INDEPENDENT_AMBULATORY_CARE_PROVIDER_SITE_OTHER): Payer: Self-pay | Admitting: Family Medicine

## 2019-09-04 DIAGNOSIS — E119 Type 2 diabetes mellitus without complications: Secondary | ICD-10-CM

## 2019-09-06 ENCOUNTER — Other Ambulatory Visit: Payer: Self-pay | Admitting: Obstetrics & Gynecology

## 2019-09-06 DIAGNOSIS — Z1231 Encounter for screening mammogram for malignant neoplasm of breast: Secondary | ICD-10-CM

## 2019-09-07 ENCOUNTER — Telehealth (INDEPENDENT_AMBULATORY_CARE_PROVIDER_SITE_OTHER): Payer: BC Managed Care – PPO | Admitting: Family Medicine

## 2019-09-14 ENCOUNTER — Other Ambulatory Visit (INDEPENDENT_AMBULATORY_CARE_PROVIDER_SITE_OTHER): Payer: Self-pay | Admitting: Family Medicine

## 2019-09-14 DIAGNOSIS — E559 Vitamin D deficiency, unspecified: Secondary | ICD-10-CM

## 2019-09-28 ENCOUNTER — Telehealth (INDEPENDENT_AMBULATORY_CARE_PROVIDER_SITE_OTHER): Payer: BC Managed Care – PPO | Admitting: Family Medicine

## 2019-10-05 ENCOUNTER — Encounter (INDEPENDENT_AMBULATORY_CARE_PROVIDER_SITE_OTHER): Payer: Self-pay | Admitting: Family Medicine

## 2019-10-05 ENCOUNTER — Other Ambulatory Visit: Payer: Self-pay

## 2019-10-05 ENCOUNTER — Telehealth (INDEPENDENT_AMBULATORY_CARE_PROVIDER_SITE_OTHER): Payer: BC Managed Care – PPO | Admitting: Family Medicine

## 2019-10-05 DIAGNOSIS — Z6839 Body mass index (BMI) 39.0-39.9, adult: Secondary | ICD-10-CM

## 2019-10-05 DIAGNOSIS — E559 Vitamin D deficiency, unspecified: Secondary | ICD-10-CM | POA: Diagnosis not present

## 2019-10-05 MED ORDER — VITAMIN D (ERGOCALCIFEROL) 1.25 MG (50000 UNIT) PO CAPS: 50000.0000 [IU] | ORAL_CAPSULE | ORAL | 0 refills | Status: DC

## 2019-10-06 NOTE — Progress Notes (Signed)
Office: [phone]  /  Fax: [phone] TeleHealth Visit:  Cynthia Short has verbally consented to this TeleHealth visit today. The patient is located at home, the provider is located at the [facility] office. The participants in this visit include the listed provider and patient. Xian was unable to use realtime audiovisual technology today and the telehealth visit was conducted via telephone.   HPI:   Chief Complaint: OBESITY Cynthia Short is here to discuss her progress with her obesity treatment plan. She is on the keep a food journal with 1200-1400 calories and 80+ grams of protein daily and is following her eating plan approximately 50 % of the time. She states she is walking for 60 minutes 3-4 times per week. Cynthia Short continues to maintain her weight. She journals on and off, but is not very strict. She is mostly trying to portion control and make smart choices. She states her CBGs were 112 this morning, and blood pressure was 135/85. We were unable to weigh the patient today for this TeleHealth visit. She feels as if she has maintained her weight since her last visit. She has lost 5 lbs since starting treatment with [name].  Vitamin D Deficiency Cynthia Short has a diagnosis of vitamin D deficiency. She is stable on prescription Vit D, but level is not yet at goal. She is doing well remembering to take the weekly dose. She denies nausea, vomiting or muscle weakness.  ASSESSMENT AND PLAN:  Vitamin D deficiency - Plan: Vitamin D, Ergocalciferol, (DRISDOL) 1.25 MG (50000 UT) CAPS capsule  Class 2 severe obesity with serious comorbidity and body mass index (BMI) of 39.0 to 39.9 in adult, unspecified obesity type (Cynthia Short)  PLAN:  Vitamin D Deficiency Cynthia Short was informed that low vitamin D levels contributes to fatigue and are associated with obesity, breast, and colon cancer. Cynthia Short agrees to continue taking prescription Vit D 50,000 IU every week #4 and we will refill for 1 month. She  will follow up for routine testing of vitamin D, at least 2-3 times per year. She was informed of the risk of over-replacement of vitamin D and agrees to not increase her dose unless she discusses this with [name] first. We will check labs at her next visit in 1 month. Cynthia Short agrees to follow up with our clinic in 4 weeks.  Cynthia Short spent > than 50% of the 15 minute visit on counseling as documented in the note.  Obesity Cynthia Short is currently in the action stage of change. As such, her goal is to continue with weight loss efforts She has agreed to keep a food journal with 1400 calories and 80 grams of protein daily or follow the Category 2 plan Cynthia Short has been instructed to work up to a goal of 150 minutes of combined cardio and strengthening exercise per week for weight loss and overall health benefits. We discussed the following Behavioral Modification Strategies today: increasing lean protein intake and decreasing simple carbohydrates    Cynthia Short has agreed to follow up with our clinic in 4 weeks. She was informed of the importance of frequent follow up visits to maximize her success with intensive lifestyle modifications for her multiple health conditions.  ALLERGIES: Allergies  Allergen Reactions   Penicillins    Trifluoperazine     MEDICATIONS: Current Outpatient Medications on File Prior to Visit  Medication Sig Dispense Refill   aspirin 81 MG tablet Take 81 mg by mouth daily.     atenolol (TENORMIN) 25 MG tablet TAKE 1 TABLET  BY MOUTH ONCE DAILY 30 tablet 0   estradiol (VIVELLE-DOT) 0.1 MG/24HR Place 1 patch onto the skin 2 (two) times a week.     finasteride (PROPECIA) 1 MG tablet Take 1 mg by mouth daily.     fluticasone (FLONASE) 50 MCG/ACT nasal spray Place 2 sprays into both nostrils daily. 16 g 0   levothyroxine (SYNTHROID, LEVOTHROID) 75 MCG tablet Take 75 mcg by mouth daily.     losartan (COZAAR) 50 MG tablet Take 50 mg by mouth daily.     metFORMIN (GLUCOPHAGE-XR) 500 MG 24 hr  tablet Take 3 tablets (1,500 mg total) by mouth daily. (Patient taking differently: Take 1,000 mg by mouth daily. ) 90 tablet 0   Multiple Vitamins-Minerals (HAIR/SKIN/NAILS/BIOTIN PO) Take 1 capsule by mouth daily.     omeprazole (PRILOSEC) 20 MG capsule Take 1 capsule by mouth daily.      simvastatin (ZOCOR) 10 MG tablet Take 10 mg by mouth daily.     SYMBICORT 80-4.5 MCG/ACT inhaler Inhale 2 puffs into the lungs Twice daily.     vitamin B-12 (CYANOCOBALAMIN) 1000 MCG tablet Take 1 tablet (1,000 mcg total) by mouth daily. 30 tablet 0   No current facility-administered medications on file prior to visit.     PAST MEDICAL HISTORY: Past Medical History:  Diagnosis Date   Anemia    Anxiety    Arthritis    Asthma    Chest pain    Constipation    Diabetes mellitus    Dyspnea    Gallbladder problem    GERD (gastroesophageal reflux disease)    Hair loss    Heart murmur    Hiatal hernia    Hip problem    Hyperlipemia    Hypertension    IBS (irritable bowel syndrome)    Joint pain    Knee problem    Leg edema    Thyroid disease    hypothyroidism   Tricuspid valve disorder     PAST SURGICAL HISTORY: Past Surgical History:  Procedure Laterality Date   ABDOMINAL HYSTERECTOMY  3/98   CHOLECYSTECTOMY  02/2006   KNEE SURGERY     MYOMECTOMY  12/93   uterine fibroids   TMJ ARTHROPLASTY  8/82    SOCIAL HISTORY: Social History   Tobacco Use   Smoking status: Never Smoker   Smokeless tobacco: Never Used  Substance Use Topics   Alcohol use: No   Drug use: No    FAMILY HISTORY: Family History  Problem Relation Age of Onset   Cancer Father        lung   Diabetes Father    Heart disease Father    Stroke Father    Kidney disease Father    Heart disease Maternal Uncle    Cancer Maternal Grandmother        colon   Heart disease Maternal Grandfather    Cancer Paternal Grandmother        colon    ROS: Review of Systems    Constitutional: Negative for weight loss.  Gastrointestinal: Negative for nausea and vomiting.  Musculoskeletal:       Negative muscle weakness    PHYSICAL EXAM: Pt in no acute distress  RECENT LABS AND TESTS: BMET    Component Value Date/Time   NA 140 02/15/2019 1253   K 4.5 02/15/2019 1253   CL 100 02/15/2019 1253   CO2 23 02/15/2019 1253   GLUCOSE 98 02/15/2019 1253   BUN 12 02/15/2019 1253   CREATININE  0.67 02/15/2019 1253   CALCIUM 9.2 02/15/2019 1253   GFRNONAA 95 02/15/2019 1253   GFRAA 109 02/15/2019 1253   Lab Results  Component Value Date   HGBA1C 6.4 (H) 02/15/2019   HGBA1C 6.4 (H) 11/23/2018   HGBA1C 6.3 (H) 08/26/2018   Lab Results  Component Value Date   INSULIN 18.5 02/15/2019   INSULIN 24.9 11/23/2018   INSULIN 14.9 08/26/2018   CBC    Component Value Date/Time   WBC 7.3 08/26/2018 1055   RBC 4.45 08/26/2018 1055   HGB 12.6 08/26/2018 1055   HCT 39.0 08/26/2018 1055   MCV 88 08/26/2018 1055   MCH 28.3 08/26/2018 1055   MCHC 32.3 08/26/2018 1055   RDW 13.1 08/26/2018 1055   LYMPHSABS 2.1 08/26/2018 1055   EOSABS 0.3 08/26/2018 1055   BASOSABS 0.0 08/26/2018 1055   Iron/TIBC/Ferritin/ %Sat No results found for: IRON, TIBC, FERRITIN, IRONPCTSAT Lipid Panel     Component Value Date/Time   CHOL 131 08/26/2018 1055   TRIG 91 08/26/2018 1055   HDL 42 08/26/2018 1055   LDLCALC 71 08/26/2018 1055   Hepatic Function Panel     Component Value Date/Time   PROT 7.2 02/15/2019 1253   ALBUMIN 3.8 02/15/2019 1253   AST 14 02/15/2019 1253   ALT 13 02/15/2019 1253   ALKPHOS 74 02/15/2019 1253   BILITOT 0.4 02/15/2019 1253      Component Value Date/Time   TSH 1.740 11/23/2018 0947   TSH 2.320 08/26/2018 1055      Cynthia Short, Trixie Dredge, am acting as Location manager for Dennard Nip, MD Cynthia Short have reviewed the above documentation for accuracy and completeness, and Cynthia Short agree with the above. -Dennard Nip, MD

## 2019-10-26 ENCOUNTER — Other Ambulatory Visit (INDEPENDENT_AMBULATORY_CARE_PROVIDER_SITE_OTHER): Payer: Self-pay | Admitting: Family Medicine

## 2019-10-26 DIAGNOSIS — E559 Vitamin D deficiency, unspecified: Secondary | ICD-10-CM

## 2019-10-31 ENCOUNTER — Other Ambulatory Visit: Payer: Self-pay

## 2019-10-31 ENCOUNTER — Encounter (INDEPENDENT_AMBULATORY_CARE_PROVIDER_SITE_OTHER): Payer: Self-pay | Admitting: Family Medicine

## 2019-10-31 ENCOUNTER — Ambulatory Visit (INDEPENDENT_AMBULATORY_CARE_PROVIDER_SITE_OTHER): Payer: BC Managed Care – PPO | Admitting: Family Medicine

## 2019-10-31 VITALS — BP 127/77 | HR 62 | Temp 97.9°F | Ht 63.0 in | Wt 217.0 lb

## 2019-10-31 DIAGNOSIS — E119 Type 2 diabetes mellitus without complications: Secondary | ICD-10-CM

## 2019-10-31 DIAGNOSIS — E559 Vitamin D deficiency, unspecified: Secondary | ICD-10-CM

## 2019-10-31 DIAGNOSIS — Z9189 Other specified personal risk factors, not elsewhere classified: Secondary | ICD-10-CM

## 2019-10-31 MED ORDER — VITAMIN D (ERGOCALCIFEROL) 1.25 MG (50000 UNIT) PO CAPS: 50000.0000 [IU] | ORAL_CAPSULE | ORAL | 0 refills | Status: DC

## 2019-11-01 LAB — COMPREHENSIVE METABOLIC PANEL
ALT: 16 IU/L (ref 0–32)
AST: 17 IU/L (ref 0–40)
Albumin/Globulin Ratio: 1.2 (ref 1.2–2.2)
Albumin: 4 g/dL (ref 3.8–4.8)
Alkaline Phosphatase: 129 IU/L — ABNORMAL HIGH (ref 39–117)
BUN/Creatinine Ratio: 17 (ref 12–28)
BUN: 12 mg/dL (ref 8–27)
Bilirubin Total: <0.2 mg/dL (ref 0.0–1.2)
CO2: 24 mmol/L (ref 20–29)
Calcium: 9.1 mg/dL (ref 8.7–10.3)
Chloride: 99 mmol/L (ref 96–106)
Creatinine, Ser: 0.71 mg/dL (ref 0.57–1.00)
GFR calc Af Amer: 106 mL/min/{1.73_m2} (ref >59)
GFR calc non Af Amer: 92 mL/min/{1.73_m2} (ref >59)
Globulin, Total: 3.4 g/dL (ref 1.5–4.5)
Glucose: 110 mg/dL — ABNORMAL HIGH (ref 65–99)
Potassium: 4.2 mmol/L (ref 3.5–5.2)
Sodium: 139 mmol/L (ref 134–144)
Total Protein: 7.4 g/dL (ref 6.0–8.5)

## 2019-11-01 LAB — VITAMIN D 25 HYDROXY (VIT D DEFICIENCY, FRACTURES): Vit D, 25-Hydroxy: 33 ng/mL (ref 30.0–100.0)

## 2019-11-01 LAB — INSULIN, RANDOM: INSULIN: 20.8 u[IU]/mL (ref 2.6–24.9)

## 2019-11-01 LAB — HEMOGLOBIN A1C
Est. average glucose Bld gHb Est-mCnc: 131 mg/dL
Hgb A1c MFr Bld: 6.2 % — ABNORMAL HIGH (ref 4.8–5.6)

## 2019-11-01 LAB — T3: T3, Total: 141 ng/dL (ref 71–180)

## 2019-11-01 LAB — TSH: TSH: 1.99 u[IU]/mL (ref 0.450–4.500)

## 2019-11-01 LAB — T4, FREE: Free T4: 1.48 ng/dL (ref 0.82–1.77)

## 2019-11-01 NOTE — Progress Notes (Signed)
Office: [phone]  /  Fax: [phone]   HPI:   Chief Complaint: OBESITY Cynthia Short is here to discuss her progress with her obesity treatment plan. She is on the keep a food journal with 1400 calories and 80 grams of protein daily or follow the Category 2 plan and is following her eating plan approximately 65 % of the time. She states she is walking for 60 minutes 2-3 times per week. Cynthia Short continues to do well with weight loss, but has been feeling a bit deprived and panicky due to [place] restrictions in addition to her eating plan. She is still struggling with the concept of healthy eating the rest of her life versus a short term diet.  Her weight is 217 lb (98.4 kg) today and has had a weight loss of 3 pounds over a period of 8 months since her last visit. She has lost 8 lbs since starting treatment with [name].  Vitamin D Deficiency Cynthia Short has a diagnosis of vitamin D deficiency. She is currently taking prescription Vit D, but level is not yet at goal. She denies nausea, vomiting or muscle weakness.  Diabetes II Cynthia Short has a diagnosis of diabetes type II. Cynthia Short is not checking her blood sugars at home. She is due for labs and denies hypoglycemia. Last A1c was 6.4. She has been working on intensive lifestyle modifications including diet, exercise, and weight loss to help control her blood glucose levels.  At risk for cardiovascular disease Cynthia Short is at a higher than average risk for cardiovascular disease due to obesity and diabetes II. She currently denies any chest pain.  ASSESSMENT AND PLAN:  Vitamin D deficiency - Plan: Vitamin D (25 hydroxy), Vitamin D, Ergocalciferol, (DRISDOL) 1.25 MG (50000 UT) CAPS capsule  Type 2 diabetes mellitus without complication, without long-term current use of insulin (HCC) - Plan: Urine Microalbumin w/creat. ratio, T3, T4, free, TSH, HgB A1c, Insulin, random, Comprehensive Metabolic Panel (CMET)  At risk for heart disease  PLAN:  Vitamin D Deficiency  Cynthia Short was informed that low vitamin D levels contributes to fatigue and are associated with obesity, breast, and colon cancer. Cynthia Short agrees to continue taking prescription Vit D 50,000 IU every week #4 and we will refill for 1 month. She will follow up for routine testing of vitamin D, at least 2-3 times per year. She was informed of the risk of over-replacement of vitamin D and agrees to not increase her dose unless she discusses this with [name] first. We will check labs today. Cynthia Short agrees to follow up with our clinic in 2 to 3 weeks.  Diabetes II Cynthia Short has been given extensive diabetes education by myself today including ideal fasting and post-prandial blood glucose readings, individual ideal Hgb A1c goals and hypoglycemia prevention. We discussed the importance of good blood sugar control to decrease the likelihood of diabetic complications such as nephropathy, neuropathy, limb loss, blindness, coronary artery disease, and death. We discussed the importance of intensive lifestyle modification including diet, exercise and weight loss as the first line treatment for diabetes. Cynthia Short agrees to continue her medications, and continue diet and exercise. We will check labs today. [name] agrees to follow up with our clinic in 2 to 3 weeks.  Cardiovascular risk counseling [name] was given extended (15 minutes) coronary artery disease prevention counseling today. She is 63 y.o. female and has risk factors for heart disease including obesity and diabetes II. We discussed intensive lifestyle modifications today with an emphasis on specific weight loss instructions and strategies. Pt  was also informed of the importance of increasing exercise and decreasing saturated fats to help prevent heart disease.  Obesity Cynthia Short is currently in the action stage of change. As such, her goal is to continue with weight loss efforts She has agreed to keep a food journal with 1400 calories and 80+ grams of protein daily or follow the  Category 2 plan Cynthia Short has been instructed to work up to a goal of 150 minutes of combined cardio and strengthening exercise per week for weight loss and overall health benefits. We discussed the following Behavioral Modification Strategies today: increasing lean protein intake, holiday eating strategies  and emotional eating strategies   Cynthia Short has agreed to follow up with our clinic in 2 to 3 weeks. She was informed of the importance of frequent follow up visits to maximize her success with intensive lifestyle modifications for her multiple health conditions.  ALLERGIES: Allergies  Allergen Reactions  . Penicillins   . Trifluoperazine     MEDICATIONS: Current Outpatient Medications on File Prior to Visit  Medication Sig Dispense Refill  . aspirin 81 MG tablet Take 81 mg by mouth daily.    Cynthia Short atenolol (TENORMIN) 25 MG tablet TAKE 1 TABLET BY MOUTH ONCE DAILY 30 tablet 0  . estradiol (VIVELLE-DOT) 0.1 MG/24HR Place 1 patch onto the skin 2 (two) times a week.    . finasteride (PROPECIA) 1 MG tablet Take 1 mg by mouth daily.    . fluticasone (FLONASE) 50 MCG/ACT nasal spray Place 2 sprays into both nostrils daily. 16 g 0  . levothyroxine (SYNTHROID, LEVOTHROID) 75 MCG tablet Take 75 mcg by mouth daily.    Cynthia Short losartan (COZAAR) 50 MG tablet Take 50 mg by mouth daily.    . metFORMIN (GLUCOPHAGE-XR) 500 MG 24 hr tablet Take 3 tablets (1,500 mg total) by mouth daily. (Patient taking differently: Take 1,000 mg by mouth daily. ) 90 tablet 0  . Multiple Vitamins-Minerals (HAIR/SKIN/NAILS/BIOTIN PO) Take 1 capsule by mouth daily.    Cynthia Short omeprazole (PRILOSEC) 20 MG capsule Take 1 capsule by mouth daily.     . simvastatin (ZOCOR) 10 MG tablet Take 10 mg by mouth daily.    . SYMBICORT 80-4.5 MCG/ACT inhaler Inhale 2 puffs into the lungs Twice daily.    . vitamin B-12 (CYANOCOBALAMIN) 1000 MCG tablet Take 1 tablet (1,000 mcg total) by mouth daily. 30 tablet 0   No current facility-administered medications  on file prior to visit.     PAST MEDICAL HISTORY: Past Medical History:  Diagnosis Date  . Anemia   . Anxiety   . Arthritis   . Asthma   . Chest pain   . Constipation   . Diabetes mellitus   . Dyspnea   . Gallbladder problem   . GERD (gastroesophageal reflux disease)   . Hair loss   . Heart murmur   . Hiatal hernia   . Hip problem   . Hyperlipemia   . Hypertension   . IBS (irritable bowel syndrome)   . Joint pain   . Knee problem   . Leg edema   . Thyroid disease    hypothyroidism  . Tricuspid valve disorder     PAST SURGICAL HISTORY: Past Surgical History:  Procedure Laterality Date  . ABDOMINAL HYSTERECTOMY  3/98  . CHOLECYSTECTOMY  02/2006  . KNEE SURGERY    . MYOMECTOMY  12/93   uterine fibroids  . TMJ ARTHROPLASTY  8/82    SOCIAL HISTORY: Social History   Tobacco Use  .  Smoking status: Never Smoker  . Smokeless tobacco: Never Used  Substance Use Topics  . Alcohol use: No  . Drug use: No    FAMILY HISTORY: Family History  Problem Relation Age of Onset  . Cancer Father        lung  . Diabetes Father   . Heart disease Father   . Stroke Father   . Kidney disease Father   . Heart disease Maternal Uncle   . Cancer Maternal Grandmother        colon  . Heart disease Maternal Grandfather   . Cancer Paternal Grandmother        colon    ROS: Review of Systems  Constitutional: Positive for weight loss.  Cardiovascular: Negative for chest pain.  Gastrointestinal: Negative for nausea and vomiting.  Musculoskeletal:       Negative muscle weakness  Endo/Heme/Allergies:       Negative hypoglycemia    PHYSICAL EXAM: Blood pressure 127/77, pulse 62, temperature 97.9 F (36.6 C), temperature source Oral, height 5\' 3"  (1.6 m), weight 217 lb (98.4 kg), SpO2 98 %. Body mass index is 38.44 kg/m. Physical Exam Vitals signs reviewed.  Constitutional:      Appearance: Normal appearance. She is obese.  Cardiovascular:     Rate and Rhythm: Normal  rate.     Pulses: Normal pulses.  Pulmonary:     Effort: Pulmonary effort is normal.     Breath sounds: Normal breath sounds.  Musculoskeletal: Normal range of motion.  Skin:    General: Skin is warm and dry.  Neurological:     Mental Status: She is alert and oriented to person, place, and time.  Psychiatric:        Mood and Affect: Mood normal.        Behavior: Behavior normal.     RECENT LABS AND TESTS: BMET    Component Value Date/Time   NA 139 10/31/2019 0839   K 4.2 10/31/2019 0839   CL 99 10/31/2019 0839   CO2 24 10/31/2019 0839   GLUCOSE 110 (H) 10/31/2019 0839   BUN 12 10/31/2019 0839   CREATININE 0.71 10/31/2019 0839   CALCIUM 9.1 10/31/2019 0839   GFRNONAA 92 10/31/2019 0839   GFRAA 106 10/31/2019 0839   Lab Results  Component Value Date   HGBA1C 6.2 (H) 10/31/2019   HGBA1C 6.4 (H) 02/15/2019   HGBA1C 6.4 (H) 11/23/2018   HGBA1C 6.3 (H) 08/26/2018   Lab Results  Component Value Date   INSULIN 20.8 10/31/2019   INSULIN 18.5 02/15/2019   INSULIN 24.9 11/23/2018   INSULIN 14.9 08/26/2018   CBC    Component Value Date/Time   WBC 7.3 08/26/2018 1055   RBC 4.45 08/26/2018 1055   HGB 12.6 08/26/2018 1055   HCT 39.0 08/26/2018 1055   MCV 88 08/26/2018 1055   MCH 28.3 08/26/2018 1055   MCHC 32.3 08/26/2018 1055   RDW 13.1 08/26/2018 1055   LYMPHSABS 2.1 08/26/2018 1055   EOSABS 0.3 08/26/2018 1055   BASOSABS 0.0 08/26/2018 1055   Iron/TIBC/Ferritin/ %Sat No results found for: IRON, TIBC, FERRITIN, IRONPCTSAT Lipid Panel     Component Value Date/Time   CHOL 131 08/26/2018 1055   TRIG 91 08/26/2018 1055   HDL 42 08/26/2018 1055   LDLCALC 71 08/26/2018 1055   Hepatic Function Panel     Component Value Date/Time   PROT 7.4 10/31/2019 0839   ALBUMIN 4.0 10/31/2019 0839   AST 17 10/31/2019 0839   ALT  16 10/31/2019 0839   ALKPHOS 129 (H) 10/31/2019 0839   BILITOT <0.2 10/31/2019 0839      Component Value Date/Time   TSH 1.990 10/31/2019 0839    TSH 1.740 11/23/2018 0947   TSH 2.320 08/26/2018 1055      OBESITY BEHAVIORAL INTERVENTION VISIT  Today's visit was # 59   Starting weight: 225 lbs Starting date: 08/26/18 Today's weight : 217 lbs Today's date: 10/31/2019 Total lbs lost to date: 8    ASK: We discussed the diagnosis of obesity with Peggyann Juba today and Quana agreed to give [name] permission to discuss obesity behavioral modification therapy today.  ASSESS: Dedrick has the diagnosis of obesity and her BMI today is 38.45 Talyn is in the action stage of change   ADVISE: Aftin was educated on the multiple health risks of obesity as well as the benefit of weight loss to improve her health. She was advised of the need for long term treatment and the importance of lifestyle modifications to improve her current health and to decrease her risk of future health problems.  AGREE: Multiple dietary modification options and treatment options were discussed and  Abi agreed to follow the recommendations documented in the above note.  ARRANGE: Sachiko was educated on the importance of frequent visits to treat obesity as outlined per CMS and USPSTF guidelines and agreed to schedule her next follow up appointment today.  I, Trixie Dredge, am acting as transcriptionist for Dennard Nip, MD  I have reviewed the above documentation for accuracy and completeness, and I agree with the above. -Dennard Nip, MD

## 2019-11-02 ENCOUNTER — Other Ambulatory Visit (INDEPENDENT_AMBULATORY_CARE_PROVIDER_SITE_OTHER): Payer: Self-pay

## 2019-11-02 DIAGNOSIS — E119 Type 2 diabetes mellitus without complications: Secondary | ICD-10-CM

## 2019-11-03 LAB — MICROALBUMIN / CREATININE URINE RATIO
Creatinine, Urine: 114.5 mg/dL
Microalb/Creat Ratio: 20 mg/g creat (ref 0–29)
Microalbumin, Urine: 23.2 ug/mL

## 2019-11-20 ENCOUNTER — Other Ambulatory Visit (INDEPENDENT_AMBULATORY_CARE_PROVIDER_SITE_OTHER): Payer: Self-pay | Admitting: Family Medicine

## 2019-11-20 DIAGNOSIS — E559 Vitamin D deficiency, unspecified: Secondary | ICD-10-CM

## 2019-11-21 ENCOUNTER — Other Ambulatory Visit: Payer: Self-pay

## 2019-11-21 ENCOUNTER — Ambulatory Visit
Admission: RE | Admit: 2019-11-21 | Discharge: 2019-11-21 | Disposition: A | Discharge status: Home / Self Care | Payer: BC Managed Care – PPO | Source: Ambulatory Visit | Attending: Obstetrics & Gynecology | Admitting: Obstetrics & Gynecology

## 2019-11-21 ENCOUNTER — Ambulatory Visit (INDEPENDENT_AMBULATORY_CARE_PROVIDER_SITE_OTHER): Payer: BC Managed Care – PPO | Admitting: Family Medicine

## 2019-11-21 DIAGNOSIS — Z1231 Encounter for screening mammogram for malignant neoplasm of breast: Secondary | ICD-10-CM

## 2019-11-29 ENCOUNTER — Ambulatory Visit (INDEPENDENT_AMBULATORY_CARE_PROVIDER_SITE_OTHER): Payer: BC Managed Care – PPO | Admitting: Family Medicine

## 2019-11-30 ENCOUNTER — Encounter (INDEPENDENT_AMBULATORY_CARE_PROVIDER_SITE_OTHER): Payer: Self-pay | Admitting: Family Medicine

## 2019-11-30 ENCOUNTER — Other Ambulatory Visit: Payer: Self-pay

## 2019-11-30 ENCOUNTER — Telehealth (INDEPENDENT_AMBULATORY_CARE_PROVIDER_SITE_OTHER): Payer: BC Managed Care – PPO | Admitting: Family Medicine

## 2019-11-30 DIAGNOSIS — E119 Type 2 diabetes mellitus without complications: Secondary | ICD-10-CM | POA: Diagnosis not present

## 2019-11-30 DIAGNOSIS — Z6838 Body mass index (BMI) 38.0-38.9, adult: Secondary | ICD-10-CM

## 2019-12-05 NOTE — Progress Notes (Signed)
Office: [phone]  /  Fax: [phone] TeleHealth Visit:  Cynthia Short has verbally consented to this TeleHealth visit today. The patient is located at home, the provider is located at the [facility] office. The participants in this visit include the listed provider and patient. The visit was conducted today via telephone call (AV failed - changed to telephone call).  HPI:  Chief Complaint: OBESITY Cynthia Short is here to discuss her progress with her obesity treatment plan. She is on the Category 2 plan and states she is following her eating plan approximately 65-70% of the time. She states she is walking 45 minutes 2-3 times per week.  Thera feels she is doing well with weight loss on her Category 2 plan. Hunger is mostly controlled and she feels she is doing well with decreased stress eating and snacking. She plans to do a little celebration eating for Christmas and her anniversary, but plans on being mindful.   Starting weight: 225 lbs Starting date: 08/26/2018  Diabetes Mellitus Type II Cynthia Short has a diagnosis of diabetes mellitus type II. She states her fasting blood sugars have increased slightly this month and run as high as 120 in the a.m. Her last A1c was well controlled at 6.2 on 10/31/2019.  ASSESSMENT AND PLAN:  Type 2 diabetes mellitus without complication, without long-term current use of insulin (HCC)  Class 2 severe obesity with serious comorbidity and body mass index (BMI) of 38.0 to 38.9 in adult, unspecified obesity type ([place])  PLAN:  Diabetes II Cynthia Short has been given diabetes education by myself today. Good blood sugar control is important to decrease the likelihood of diabetic complications such as nephropathy, neuropathy, limb loss, blindness, coronary artery disease, and death. Intensive lifestyle modification including diet, exercise and weight loss were discussed as the first line treatment for diabetes. Cynthia Short was reassured a fasting blood sugar  of 120 was not a problem and will continue to monitor. She will continue diet, exercise, and continue metformin.  I spent > than 50% of the 14 minute visit on counseling as documented in the note.    Obesity Cynthia Short is currently in the action stage of change. As such, her goal is to continue with weight loss efforts. She has agreed to follow the Category 2 plan. Cynthia Short will work up to a goal of 150 minutes of combined cardio and strengthening exercise per week for weight loss and overall health benefits. We discussed the following Behavioral Modification Strategies today: holiday eating strategies.   Cynthia Short has agreed to follow-up with our clinic in 3-4 weeks. She was informed of the importance of frequent follow-up visits to maximize her success with intensive lifestyle modifications for her multiple health conditions.  ALLERGIES: Allergies  Allergen Reactions  . Penicillins   . Trifluoperazine     MEDICATIONS: Current Outpatient Medications on File Prior to Visit  Medication Sig Dispense Refill  . aspirin 81 MG tablet Take 81 mg by mouth daily.    [name] atenolol (TENORMIN) 25 MG tablet TAKE 1 TABLET BY MOUTH ONCE DAILY 30 tablet 0  . estradiol (VIVELLE-DOT) 0.1 MG/24HR Place 1 patch onto the skin 2 (two) times a week.    . finasteride (PROPECIA) 1 MG tablet Take 1 mg by mouth daily.    . fluticasone (FLONASE) 50 MCG/ACT nasal spray Place 2 sprays into both nostrils daily. 16 g 0  . levothyroxine (SYNTHROID, LEVOTHROID) 75 MCG tablet Take 75 mcg by mouth daily.    [name] losartan (COZAAR) 50  MG tablet Take 50 mg by mouth daily.    . metFORMIN (GLUCOPHAGE-XR) 500 MG 24 hr tablet Take 3 tablets (1,500 mg total) by mouth daily. (Patient taking differently: Take 1,000 mg by mouth daily. ) 90 tablet 0  . Multiple Vitamins-Minerals (HAIR/SKIN/NAILS/BIOTIN PO) Take 1 capsule by mouth daily.    [name] omeprazole (PRILOSEC) 20 MG capsule Take 1 capsule by mouth daily.     . simvastatin (ZOCOR) 10 MG tablet  Take 10 mg by mouth daily.    . SYMBICORT 80-4.5 MCG/ACT inhaler Inhale 2 puffs into the lungs Twice daily.    . vitamin B-12 (CYANOCOBALAMIN) 1000 MCG tablet Take 1 tablet (1,000 mcg total) by mouth daily. 30 tablet 0  . Vitamin D, Ergocalciferol, (DRISDOL) 1.25 MG (50000 UT) CAPS capsule Take 1 capsule (50,000 Units total) by mouth every 7 (seven) days. 4 capsule 0   No current facility-administered medications on file prior to visit.    PAST MEDICAL HISTORY: Past Medical History:  Diagnosis Date  . Anemia   . Anxiety   . Arthritis   . Asthma   . Chest pain   . Constipation   . Diabetes mellitus   . Dyspnea   . Gallbladder problem   . GERD (gastroesophageal reflux disease)   . Hair loss   . Heart murmur   . Hiatal hernia   . Hip problem   . Hyperlipemia   . Hypertension   . IBS (irritable bowel syndrome)   . Joint pain   . Knee problem   . Leg edema   . Thyroid disease    hypothyroidism  . Tricuspid valve disorder     PAST SURGICAL HISTORY: Past Surgical History:  Procedure Laterality Date  . ABDOMINAL HYSTERECTOMY  3/98  . CHOLECYSTECTOMY  02/2006  . KNEE SURGERY    . MYOMECTOMY  12/93   uterine fibroids  . TMJ ARTHROPLASTY  8/82    SOCIAL HISTORY: Social History   Tobacco Use  . Smoking status: Never Smoker  . Smokeless tobacco: Never Used  Substance Use Topics  . Alcohol use: No  . Drug use: No    FAMILY HISTORY: Family History  Problem Relation Age of Onset  . Cancer Father        lung  . Diabetes Father   . Heart disease Father   . Stroke Father   . Kidney disease Father   . Heart disease Maternal Uncle   . Cancer Maternal Grandmother        colon  . Heart disease Maternal Grandfather   . Cancer Paternal Grandmother        colon   ROS: ROS none noted.  PHYSICAL EXAM: There were no vitals taken for this visit. There is no height or weight on file to calculate BMI. Physical Exam: Pt in no acute distress.  RECENT LABS AND  TESTS: BMET    Component Value Date/Time   NA 139 10/31/2019 0839   K 4.2 10/31/2019 0839   CL 99 10/31/2019 0839   CO2 24 10/31/2019 0839   GLUCOSE 110 (H) 10/31/2019 0839   BUN 12 10/31/2019 0839   CREATININE 0.71 10/31/2019 0839   CALCIUM 9.1 10/31/2019 0839   GFRNONAA 92 10/31/2019 0839   GFRAA 106 10/31/2019 0839   Lab Results  Component Value Date   HGBA1C 6.2 (H) 10/31/2019   HGBA1C 6.4 (H) 02/15/2019   HGBA1C 6.4 (H) 11/23/2018   HGBA1C 6.3 (H) 08/26/2018   Lab Results  Component Value  Date   INSULIN 20.8 10/31/2019   INSULIN 18.5 02/15/2019   INSULIN 24.9 11/23/2018   INSULIN 14.9 08/26/2018   CBC    Component Value Date/Time   WBC 7.3 08/26/2018 1055   RBC 4.45 08/26/2018 1055   HGB 12.6 08/26/2018 1055   HCT 39.0 08/26/2018 1055   MCV 88 08/26/2018 1055   MCH 28.3 08/26/2018 1055   MCHC 32.3 08/26/2018 1055   RDW 13.1 08/26/2018 1055   LYMPHSABS 2.1 08/26/2018 1055   EOSABS 0.3 08/26/2018 1055   BASOSABS 0.0 08/26/2018 1055   Iron/TIBC/Ferritin/ %Sat No results found for: IRON, TIBC, FERRITIN, IRONPCTSAT Lipid Panel     Component Value Date/Time   CHOL 131 08/26/2018 1055   TRIG 91 08/26/2018 1055   HDL 42 08/26/2018 1055   LDLCALC 71 08/26/2018 1055   Hepatic Function Panel     Component Value Date/Time   PROT 7.4 10/31/2019 0839   ALBUMIN 4.0 10/31/2019 0839   AST 17 10/31/2019 0839   ALT 16 10/31/2019 0839   ALKPHOS 129 (H) 10/31/2019 0839   BILITOT <0.2 10/31/2019 0839      Component Value Date/Time   TSH 1.990 10/31/2019 0839   TSH 1.740 11/23/2018 0947   TSH 2.320 08/26/2018 1055      I, Michaelene Song, am acting as Location manager for Dennard Nip, MD I have reviewed the above documentation for accuracy and completeness, and I agree with the above. -Dennard Nip, MD

## 2019-12-20 ENCOUNTER — Telehealth: Payer: Self-pay | Admitting: Orthopedic Surgery

## 2019-12-20 NOTE — Telephone Encounter (Signed)
Please advise if ok for this. No recent documentation of patient being seen

## 2019-12-20 NOTE — Telephone Encounter (Signed)
Patient called. She needs Dr. Marlou Sa to sign off on paperwork to renew her handicap sticker.   Call back number: [phone]

## 2019-12-20 NOTE — Telephone Encounter (Signed)
Not seen in 3 years.  Not exactly sure what she had.  Will probably need to either call her to see if she can walk 200 feet without stopping or have her come in.  Thanks

## 2019-12-21 ENCOUNTER — Other Ambulatory Visit: Payer: Self-pay

## 2019-12-21 ENCOUNTER — Telehealth (INDEPENDENT_AMBULATORY_CARE_PROVIDER_SITE_OTHER): Payer: BC Managed Care – PPO | Admitting: Family Medicine

## 2019-12-21 ENCOUNTER — Encounter (INDEPENDENT_AMBULATORY_CARE_PROVIDER_SITE_OTHER): Payer: Self-pay | Admitting: Family Medicine

## 2019-12-21 DIAGNOSIS — Z6838 Body mass index (BMI) 38.0-38.9, adult: Secondary | ICD-10-CM

## 2019-12-21 DIAGNOSIS — E538 Deficiency of other specified B group vitamins: Secondary | ICD-10-CM

## 2019-12-21 DIAGNOSIS — E559 Vitamin D deficiency, unspecified: Secondary | ICD-10-CM | POA: Diagnosis not present

## 2019-12-21 DIAGNOSIS — Z9189 Other specified personal risk factors, not elsewhere classified: Secondary | ICD-10-CM

## 2019-12-21 MED ORDER — VITAMIN D (ERGOCALCIFEROL) 1.25 MG (50000 UNIT) PO CAPS: 50000.0000 [IU] | ORAL_CAPSULE | ORAL | 0 refills | Status: DC

## 2019-12-21 NOTE — Telephone Encounter (Signed)
Tried calling no answer. LMVM advising per Dr Marlou Sa needed ROV prior to getting handicap placard

## 2019-12-26 NOTE — Progress Notes (Signed)
TeleHealth Visit:  Due to the COVID-19 pandemic, this visit was completed with telemedicine (audio/video) technology to reduce patient and provider exposure as well as to preserve personal protective equipment.   Sherisse Bessye Coba has verbally consented to this TeleHealth visit. The patient is located at home, the provider is located at the [facility] office. The participants in this visit include the listed provider and patient. Biance was unable to use realtime audiovisual technology today and the telehealth visit was conducted via telephone.  Chief Complaint: OBESITY Brayla Bellina Bureau is here to discuss her progress with her obesity treatment plan along with follow-up of her obesity related diagnoses. Samarah is on the Category 2 Plan and states she is following her eating plan approximately 50% of the time. Jeffrie states she is walking for 60 minutes 3-4 times per week.  Today's visit was #: 21 Starting weight: 225 lbs Starting date: 08/26/18  Interim History: Analleli did some celebration eating over the holidays, but she was mindful of her food choices and minimized her weight gain. She is ready to get back on track with her eating plan, and she has increased walking when it isn't too cold.  Subjective:   1. Vitamin D deficiency Seniyah is on Vit D prescription, but her Vit D level is not yet at goal. She denies nausea, vomiting, or muscle weakness.  2. B12 nutritional deficiency Maleiya is doing well on B12 supplementation and she is on a B12 rich diet. She denies recent anemia symptoms and she has no history of weight loss surgery.  3. At increased risk of exposure to COVID-19 virus Makaylia had questions about the COVID vaccine for herself, and her 64 year old mother.  Assessment/Plan:   1. Vitamin D deficiency We will refill prescription Vit D for 1 month- Vitamin D, Ergocalciferol, (DRISDOL) 1.25 MG (50000 UT) CAPS capsule; Take 1 capsule (50,000 Units total) by mouth  every 7 (seven) days. Dispense: 4 capsule; Refill: 0. We will recheck labs in 1 month, and we will continue to monitor.  2. B12 nutritional deficiency We will refill Vit B12 for 1 month. The diagnosis was reviewed with the patient. Counseling provided today, see below. We will continue to monitor. We will recheck labs at her next in office visit. Orders and follow up as documented in patient record.  Counseling . The body needs vitamin B12: to make red blood cells; to make DNA; and to help the nerves work properly so they can carry messages from the brain to the body.  . The main causes of vitamin B12 deficiency include dietary deficiency, digestive diseases, pernicious anemia, and having a surgery in which part of the stomach or small intestine is removed.  . Certain medicines can make it harder for the body to absorb vitamin B12. These medicines include: heartburn medications; some antibiotics; some medications used to treat diabetes, gout, and high cholesterol.  . In some cases, there are no symptoms of this condition. If the condition leads to anemia or nerve damage, various symptoms can occur, such as weakness or fatigue, shortness of breath, and numbness or tingling in your hands and feet.   . Treatment:  o May include taking vitamin B12 supplements.  o Avoid alcohol.  o Eat lots of healthy foods that contain vitamin B12: - Beef, pork, chicken, [place], and organ meats, such as liver.  - Seafood: This includes clams, rainbow trout, salmon, tuna, and haddock. Eggs.  - Cereal and dairy products that  are fortified: This means that vitamin B12 has been added to the food.   3. At increased risk of exposure to COVID-19 virus Jaiylah was advised that [place] rates are very high, and she was educated on the vaccine and side effects. She is to continue to mask and socially distance while she decides to vaccinate.  4. Class 2 severe obesity with serious comorbidity and body mass index (BMI) of 38.0 to  38.9 in adult, unspecified obesity type (HCC) Sheral is currently in the action stage of change. As such, her goal is to continue with weight loss efforts. She has agreed to on the Category 2 Plan.   We discussed the following exercise goals today: Teandra is to continue her current exercise regimen as is.  We discussed the following behavioral modification strategies today: meal planning and cooking strategies and planning for success.  Estell Antonita Mckendry has agreed to follow-up with our clinic in 3 to 4 weeks. She was informed of the importance of frequent follow-up visits to maximize her success with intensive lifestyle modifications for her multiple health conditions.  Objective:   VITALS: Per patient if applicable, see vitals. GENERAL: Alert and in no acute distress. CARDIOPULMONARY: No increased WOB. Speaking in clear sentences.  PSYCH: Pleasant and cooperative. Speech normal rate and rhythm. Affect is appropriate. Insight and judgement are appropriate. Attention is focused, linear, and appropriate.  NEURO: Oriented as arrived to appointment on time with no prompting.   Lab Results  Component Value Date   CREATININE 0.71 10/31/2019   BUN 12 10/31/2019   NA 139 10/31/2019   K 4.2 10/31/2019   CL 99 10/31/2019   CO2 24 10/31/2019   Lab Results  Component Value Date   ALT 16 10/31/2019   AST 17 10/31/2019   ALKPHOS 129 (H) 10/31/2019   BILITOT <0.2 10/31/2019   Lab Results  Component Value Date   HGBA1C 6.2 (H) 10/31/2019   HGBA1C 6.4 (H) 02/15/2019   HGBA1C 6.4 (H) 11/23/2018   HGBA1C 6.3 (H) 08/26/2018   Lab Results  Component Value Date   INSULIN 20.8 10/31/2019   INSULIN 18.5 02/15/2019   INSULIN 24.9 11/23/2018   INSULIN 14.9 08/26/2018   Lab Results  Component Value Date   TSH 1.990 10/31/2019   Lab Results  Component Value Date   CHOL 131 08/26/2018   HDL 42 08/26/2018   LDLCALC 71 08/26/2018   TRIG 91 08/26/2018   Lab Results  Component Value Date    WBC 7.3 08/26/2018   HGB 12.6 08/26/2018   HCT 39.0 08/26/2018   MCV 88 08/26/2018   No results found for: IRON, TIBC, FERRITIN  Attestation Statements:   Reviewed by clinician on day of visit: allergies, medications, problem list, medical history, surgical history, family history, social history, and previous encounter notes.  Time spent on visit including pre-visit chart review and post-visit care was 35 minutes. Time was spent on: preparing to see the patient (eg, review of tests), obtaining and/or reviewing separately obtained history, performing a medically necessary appropriate examination and/or evaluation, counseling and educating the patient/family/caregiver and documenting clinical information in the electronic or other health.   I, Trixie Dredge, am acting as transcriptionist for Dennard Nip, MD.  I have reviewed the above documentation for accuracy and completeness, and I agree with the above. - Dennard Nip, MD

## 2019-12-27 MED ORDER — VITAMIN B-12 1000 MCG PO TABS: 1000.0000 ug | ORAL_TABLET | Freq: Every day | ORAL | 0 refills | Status: DC

## 2019-12-29 ENCOUNTER — Ambulatory Visit: Payer: BC Managed Care – PPO | Admitting: Orthopedic Surgery

## 2020-01-08 IMAGING — MG DIGITAL SCREENING BILATERAL MAMMOGRAM WITH TOMO AND CAD
8 series · 8 of 24 positions shown · non-contrast
Comparison: Previous exam(s).

CLINICAL DATA: Screening.

EXAM:
DIGITAL SCREENING BILATERAL MAMMOGRAM WITH TOMO AND CAD

[L CC synth-2D]
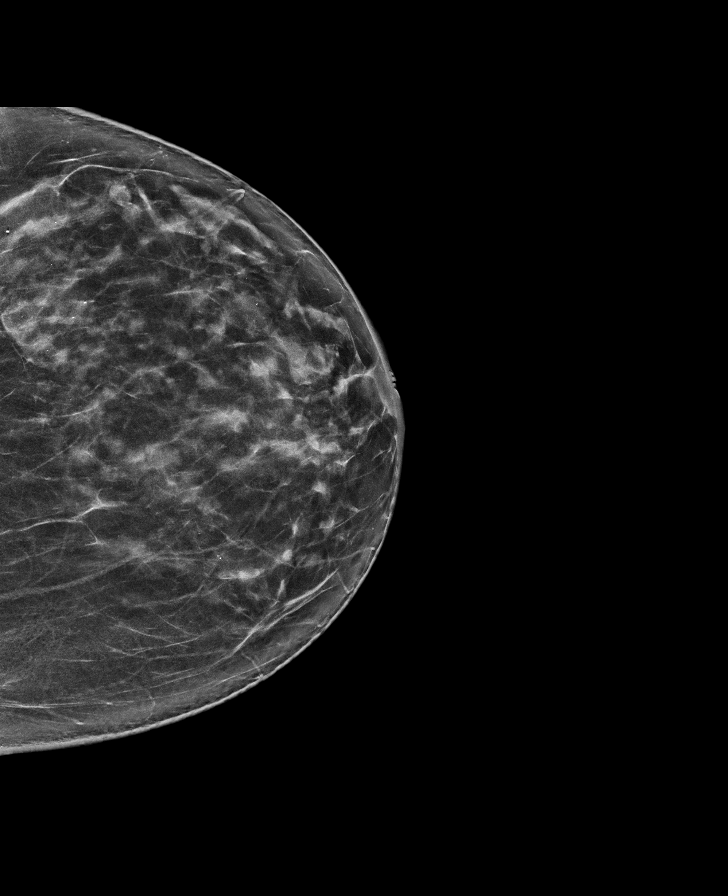

[L MLO synth-2D]
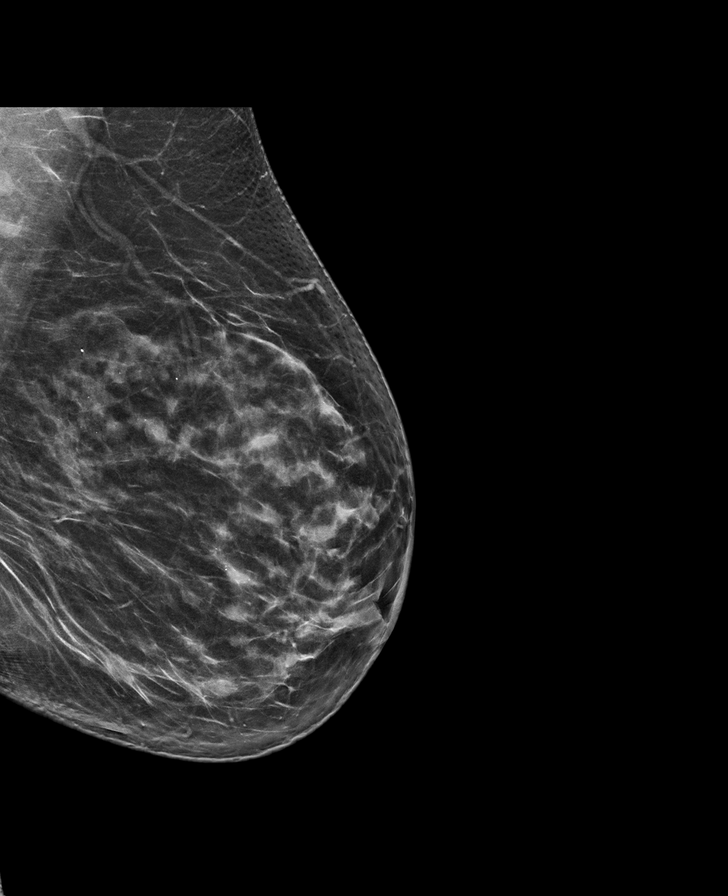

[R MLO synth-2D]
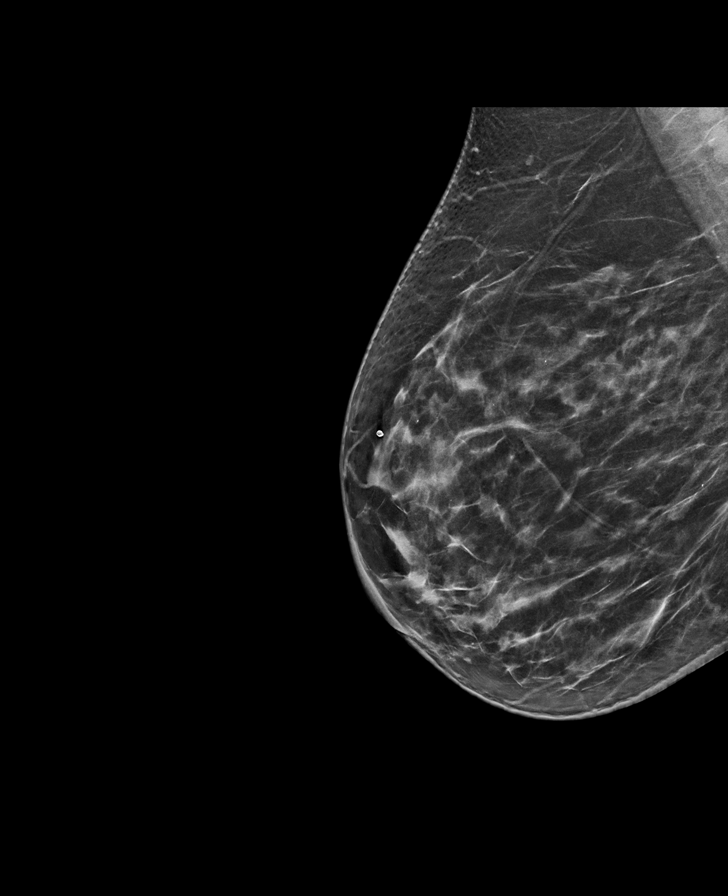

[R CC synth-2D]
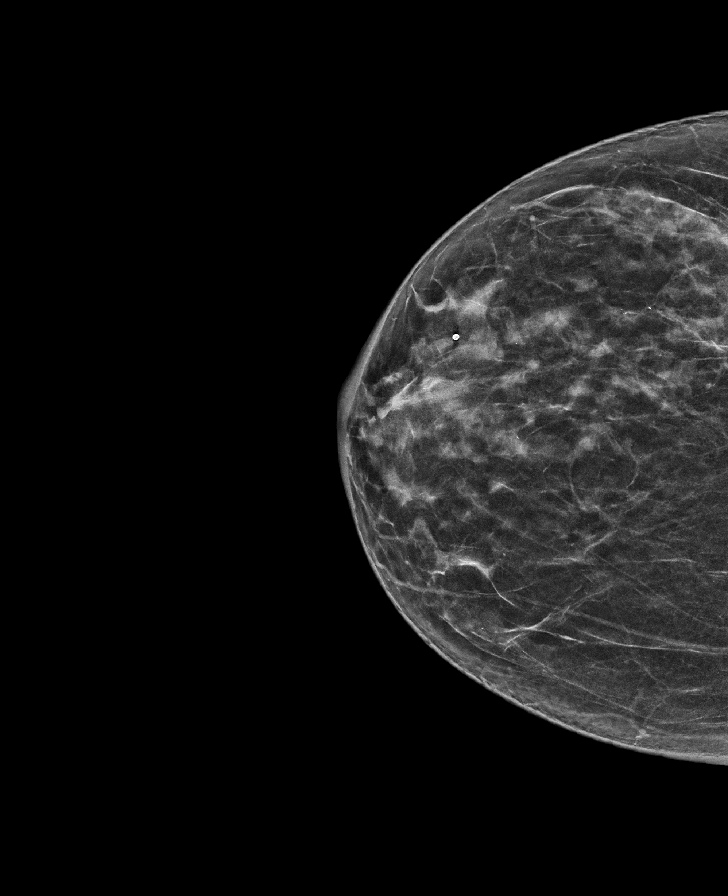

[R CC tomo · tomo slice 31/60.0]
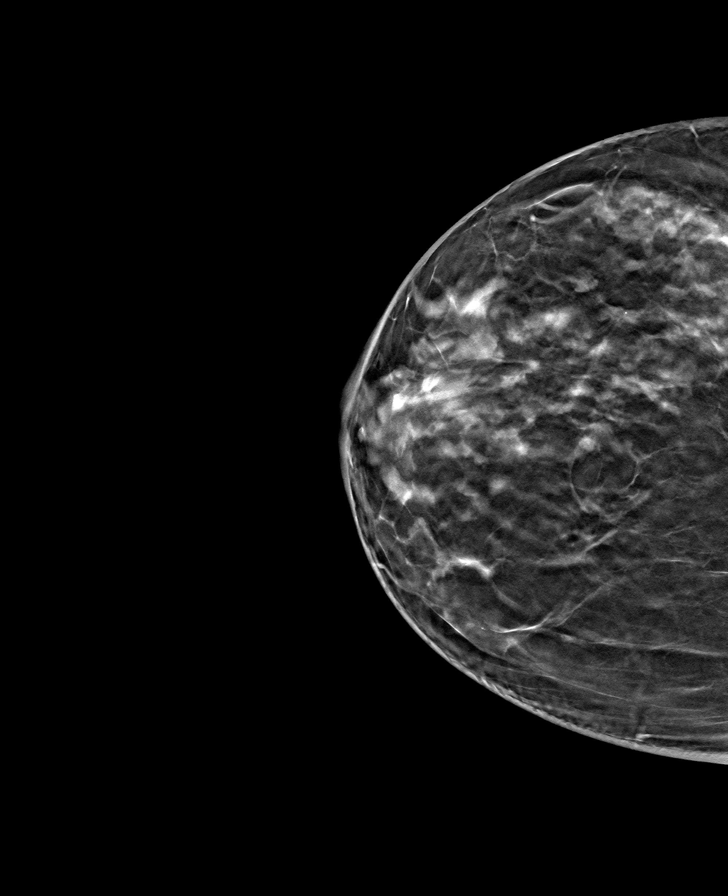

[L CC tomo · tomo slice 35/69.0]
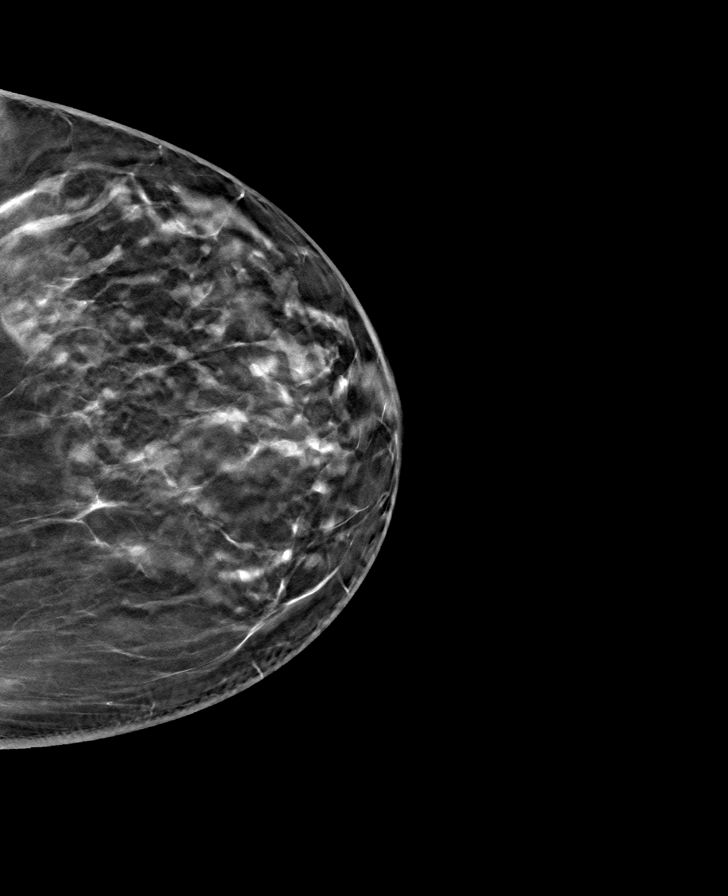

[L MLO tomo · tomo slice 37/74.0]
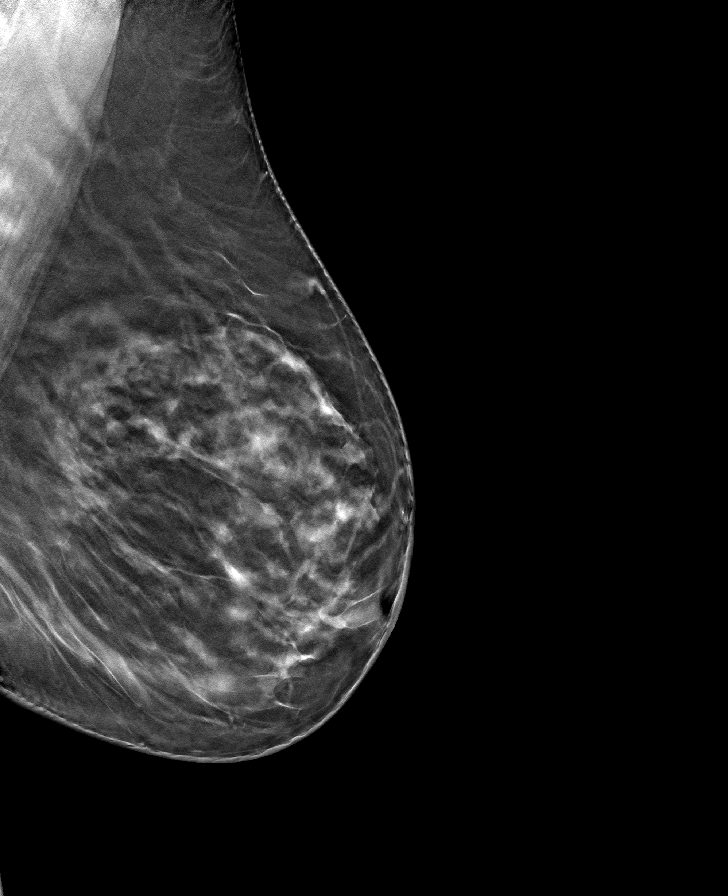

[R MLO tomo · tomo slice 37/73.0]
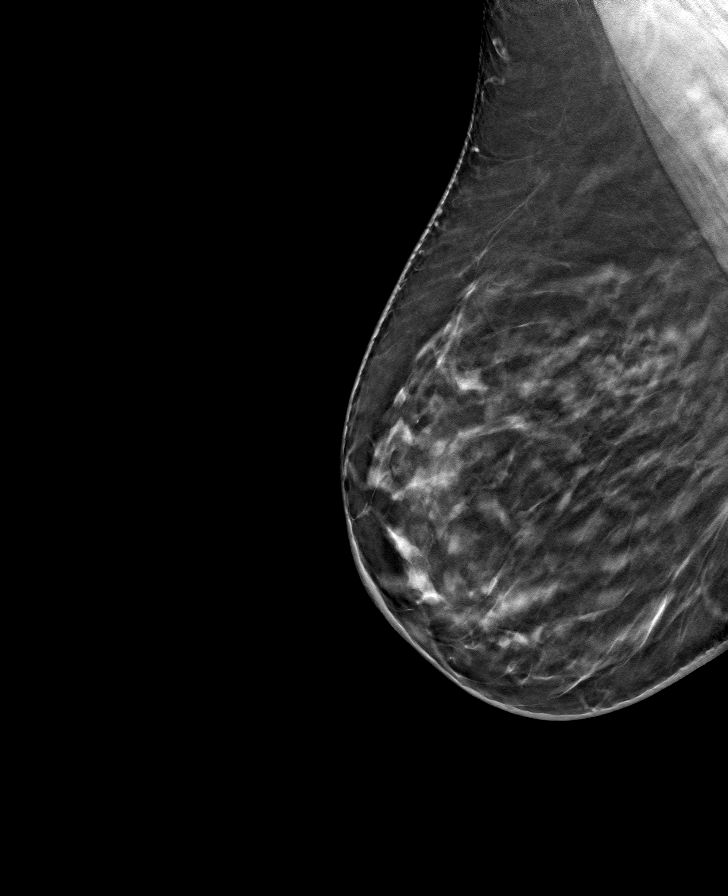

[8 of 24 positions shown; findings below may reference images not displayed]

ACR Breast Density Category b: There are scattered areas of
fibroglandular density.
FINDINGS: There are no findings suspicious for malignancy. Images were
processed with CAD.
IMPRESSION: No mammographic evidence of malignancy. A result letter of this
screening mammogram will be mailed directly to the patient.

RECOMMENDATION:
Screening mammogram in one year. (Code:[id number])

BI-RADS CATEGORY  1: Negative.

## 2020-01-10 ENCOUNTER — Other Ambulatory Visit (INDEPENDENT_AMBULATORY_CARE_PROVIDER_SITE_OTHER): Payer: Self-pay | Admitting: Family Medicine

## 2020-01-10 DIAGNOSIS — E559 Vitamin D deficiency, unspecified: Secondary | ICD-10-CM

## 2020-01-16 ENCOUNTER — Ambulatory Visit (INDEPENDENT_AMBULATORY_CARE_PROVIDER_SITE_OTHER): Payer: BC Managed Care – PPO | Admitting: Family Medicine

## 2020-01-23 ENCOUNTER — Other Ambulatory Visit (INDEPENDENT_AMBULATORY_CARE_PROVIDER_SITE_OTHER): Payer: Self-pay | Admitting: Family Medicine

## 2020-01-23 DIAGNOSIS — E538 Deficiency of other specified B group vitamins: Secondary | ICD-10-CM

## 2020-01-30 ENCOUNTER — Other Ambulatory Visit: Payer: Self-pay

## 2020-01-30 ENCOUNTER — Ambulatory Visit: Payer: BC Managed Care – PPO | Admitting: Podiatry

## 2020-01-30 ENCOUNTER — Other Ambulatory Visit: Payer: Self-pay | Admitting: Podiatry

## 2020-01-30 ENCOUNTER — Ambulatory Visit (INDEPENDENT_AMBULATORY_CARE_PROVIDER_SITE_OTHER): Payer: BC Managed Care – PPO

## 2020-01-30 DIAGNOSIS — M19071 Primary osteoarthritis, right ankle and foot: Secondary | ICD-10-CM | POA: Diagnosis not present

## 2020-01-30 DIAGNOSIS — M79671 Pain in right foot: Secondary | ICD-10-CM

## 2020-01-30 DIAGNOSIS — Q828 Other specified congenital malformations of skin: Secondary | ICD-10-CM | POA: Diagnosis not present

## 2020-01-30 DIAGNOSIS — M19079 Primary osteoarthritis, unspecified ankle and foot: Secondary | ICD-10-CM

## 2020-01-30 NOTE — Patient Instructions (Signed)
Diabetes Mellitus and Foot Care Foot care is an important part of your health, especially when you have diabetes. Diabetes may cause you to have problems because of poor blood flow (circulation) to your feet and legs, which can cause your skin to:  Become thinner and drier.  Break more easily.  Heal more slowly.  Peel and crack. You may also have nerve damage (neuropathy) in your legs and feet, causing decreased feeling in them. This means that you may not notice minor injuries to your feet that could lead to more serious problems. Noticing and addressing any potential problems early is the best way to prevent future foot problems. How to care for your feet Foot hygiene  Wash your feet daily with warm water and mild soap. Do not use hot water. Then, pat your feet and the areas between your toes until they are completely dry. Do not soak your feet as this can dry your skin.  Trim your toenails straight across. Do not dig under them or around the cuticle. File the edges of your nails with an emery board or nail file.  Apply a moisturizing lotion or petroleum jelly to the skin on your feet and to dry, brittle toenails. Use lotion that does not contain alcohol and is unscented. Do not apply lotion between your toes. Shoes and socks  Wear clean socks or stockings every day. Make sure they are not too tight. Do not wear knee-high stockings since they may decrease blood flow to your legs.  Wear shoes that fit properly and have enough cushioning. Always look in your shoes before you put them on to be sure there are no objects inside.  To break in new shoes, wear them for just a few hours a day. This prevents injuries on your feet. Wounds, scrapes, corns, and calluses  Check your feet daily for blisters, cuts, bruises, sores, and redness. If you cannot see the bottom of your feet, use a mirror or ask someone for help.  Do not cut corns or calluses or try to remove them with medicine.  If you  find a minor scrape, cut, or break in the skin on your feet, keep it and the skin around it clean and dry. You may clean these areas with mild soap and water. Do not clean the area with peroxide, alcohol, or iodine.  If you have a wound, scrape, corn, or callus on your foot, look at it several times a day to make sure it is healing and not infected. Check for: ? Redness, swelling, or pain. ? Fluid or blood. ? Warmth. ? Pus or a bad smell. General instructions  Do not cross your legs. This may decrease blood flow to your feet.  Do not use heating pads or hot water bottles on your feet. They may burn your skin. If you have lost feeling in your feet or legs, you may not know this is happening until it is too late.  Protect your feet from hot and cold by wearing shoes, such as at the beach or on hot pavement.  Schedule a complete foot exam at least once a year (annually) or more often if you have foot problems. If you have foot problems, report any cuts, sores, or bruises to your health care provider immediately. Contact a health care provider if:  You have a medical condition that increases your risk of infection and you have any cuts, sores, or bruises on your feet.  You have an injury that is not   healing.  You have redness on your legs or feet.  You feel burning or tingling in your legs or feet.  You have pain or cramps in your legs and feet.  Your legs or feet are numb.  Your feet always feel cold.  You have pain around a toenail. Get help right away if:  You have a wound, scrape, corn, or callus on your foot and: ? You have pain, swelling, or redness that gets worse. ? You have fluid or blood coming from the wound, scrape, corn, or callus. ? Your wound, scrape, corn, or callus feels warm to the touch. ? You have pus or a bad smell coming from the wound, scrape, corn, or callus. ? You have a fever. ? You have a red line going up your leg. Summary  Check your feet every day  for cuts, sores, red spots, swelling, and blisters.  Moisturize feet and legs daily.  Wear shoes that fit properly and have enough cushioning.  If you have foot problems, report any cuts, sores, or bruises to your health care provider immediately.  Schedule a complete foot exam at least once a year (annually) or more often if you have foot problems. This information is not intended to replace advice given to you by your health care provider. Make sure you discuss any questions you have with your health care provider. Document Revised: 08/24/2019 Document Reviewed: 01/02/2017 Elsevier Patient Education  2020 [employer]  

## 2020-01-30 NOTE — Progress Notes (Signed)
Subjective: 64 year old female presents the office today for new concerns of pain to the right foot pointing to submetatarsal 5 which is on the last 2 weeks.  She states it hurts with pressure.  Did seem to get worse after she changed shoes.  No recent injury or falls and no swelling.  Has gotten somewhat better on its own but overall still tender. Denies any systemic complaints such as fevers, chills, nausea, vomiting. No acute changes since last appointment, and no other complaints at this time.   Objective: AAO x3, NAD DP/PT pulses palpable bilaterally, CRT less than 3 seconds Tenderness submetatarsal 5 on the right foot there is a small punctate annular hyperkeratotic lesion.  Upon debridement there is no evidence of foreign body, open lesion or verruca.  No other areas of discomfort identified.  Small ganglion cyst on the dorsal aspect the left foot without any pain today. No open lesions or pre-ulcerative lesions.  No pain with calf compression, swelling, warmth, erythema  Assessment: Right foot porokeratosis, accessory sesamoid  Plan: -All treatment options discussed with the patient including all alternatives, risks, complications.  -X-rays obtained reviewed.  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 -Debrided hyperkeratotic lesion on the right foot with any complications or bleeding.  Recommend moisturizer and offloading daily.  Continue with supportive shoes. -We will monitor the ganglion on the left foot. -Patient encouraged to call the office with any questions, concerns, change in symptoms.   Trula Slade DPM

## 2020-02-09 ENCOUNTER — Encounter (INDEPENDENT_AMBULATORY_CARE_PROVIDER_SITE_OTHER): Payer: Self-pay | Admitting: Family Medicine

## 2020-02-09 ENCOUNTER — Ambulatory Visit (INDEPENDENT_AMBULATORY_CARE_PROVIDER_SITE_OTHER): Payer: BC Managed Care – PPO | Admitting: Family Medicine

## 2020-02-09 ENCOUNTER — Other Ambulatory Visit: Payer: Self-pay

## 2020-02-09 VITALS — BP 136/77 | HR 74 | Temp 98.4°F | Ht 63.0 in | Wt 225.0 lb

## 2020-02-09 DIAGNOSIS — Z6839 Body mass index (BMI) 39.0-39.9, adult: Secondary | ICD-10-CM | POA: Diagnosis not present

## 2020-02-09 DIAGNOSIS — Z9189 Other specified personal risk factors, not elsewhere classified: Secondary | ICD-10-CM

## 2020-02-09 DIAGNOSIS — E119 Type 2 diabetes mellitus without complications: Secondary | ICD-10-CM

## 2020-02-13 NOTE — Progress Notes (Signed)
Chief Complaint:   OBESITY Cynthia Short is here to discuss her progress with her obesity treatment plan along with follow-up of her obesity related diagnoses. Cynthia Short has not been following any plan and states she is following her eating plan approximately 0% of the time. Cynthia Short states she is doing 0 minutes 0 times per week.  Today's visit was #: 22 Starting weight: 225 lbs Starting date: 08/26/18 Today's weight: 225 lbs Today's date: 02/09/2020 Total lbs lost to date: 0 Total lbs lost since last in-office visit: 0  Interim History: Cynthia Short's last in office visit was 4 months ago, and she has gotten off track. She notes an increase in PM snacking and eating fast food. She is ready to get back on track.  Subjective:   1. Type 2 diabetes mellitus without complication, without long-term current use of insulin (HCC) Cynthia Short is stable on metformin, and she is ready to get back on track with her eating plan.  2. At increased risk of exposure to COVID-19 virus Cynthia Short has diabetes mellitus, hypertension, and obesity, and she is at high risk of developing severe illness if infected with COVID. She has gotten a lot of information about the COVID19 vaccines and wonders what she should do.  Assessment/Plan:   1. Type 2 diabetes mellitus without complication, without long-term current use of insulin (HCC) Cynthia Short is to start back to journaling and will continue to follow. Good blood sugar control is important to decrease the likelihood of diabetic complications such as nephropathy, neuropathy, limb loss, blindness, coronary artery disease, and death. Intensive lifestyle modification including diet, exercise and weight loss are the first line of treatment for diabetes.   2. At increased risk of exposure to COVID-19 virus Cynthia Short was educated on how MRNA vaccines work, and the risks of allergic reactions and side effects. She was reassured that these vaccines could not change her DNA, and she is not at high risk  of anaphlaxis.  3. Class 2 severe obesity with serious comorbidity and body mass index (BMI) of 39.0 to 39.9 in adult, unspecified obesity type (HCC) Cynthia Short is currently in the action stage of change. As such, her goal is to get back to weightloss efforts . She has agreed to keeping a food journal and adhering to recommended goals of 1200-1400 calories and 80+ grams of protein daily.   Behavioral modification strategies: meal planning and cooking strategies.  Cynthia Short has agreed to follow-up with our clinic in 2 weeks. She was informed of the importance of frequent follow-up visits to maximize her success with intensive lifestyle modifications for her multiple health conditions.   Objective:   Blood pressure 136/77, pulse 74, temperature 98.4 F (36.9 C), temperature source Oral, height 5\' 3"  (1.6 m), weight 225 lb (102.1 kg), SpO2 97 %. Body mass index is 39.86 kg/m.  General: Cooperative, alert, well developed, in no acute distress. HEENT: Conjunctivae and lids unremarkable. Cardiovascular: Regular rhythm.  Lungs: Normal work of breathing. Neurologic: No focal deficits.   Lab Results  Component Value Date   CREATININE 0.71 10/31/2019   BUN 12 10/31/2019   NA 139 10/31/2019   K 4.2 10/31/2019   CL 99 10/31/2019   CO2 24 10/31/2019   Lab Results  Component Value Date   ALT 16 10/31/2019   AST 17 10/31/2019   ALKPHOS 129 (H) 10/31/2019   BILITOT <0.2 10/31/2019   Lab Results  Component Value Date   HGBA1C 6.2 (H) 10/31/2019   HGBA1C 6.4 (H) 02/15/2019  HGBA1C 6.4 (H) 11/23/2018   HGBA1C 6.3 (H) 08/26/2018   Lab Results  Component Value Date   INSULIN 20.8 10/31/2019   INSULIN 18.5 02/15/2019   INSULIN 24.9 11/23/2018   INSULIN 14.9 08/26/2018   Lab Results  Component Value Date   TSH 1.990 10/31/2019   Lab Results  Component Value Date   CHOL 131 08/26/2018   HDL 42 08/26/2018   LDLCALC 71 08/26/2018   TRIG 91 08/26/2018   Lab Results  Component Value Date     WBC 7.3 08/26/2018   HGB 12.6 08/26/2018   HCT 39.0 08/26/2018   MCV 88 08/26/2018   No results found for: IRON, TIBC, FERRITIN  Attestation Statements:   Reviewed by clinician on day of visit: allergies, medications, problem list, medical history, surgical history, family history, social history, and previous encounter notes.  Time spent on visit including pre-visit chart review and post-visit care and charting was 50 minutes.    I, Trixie Dredge, am acting as transcriptionist for Dennard Nip, MD.  I have reviewed the above documentation for accuracy and completeness, and I agree with the above. -  Dennard Nip, MD

## 2020-02-16 ENCOUNTER — Other Ambulatory Visit (INDEPENDENT_AMBULATORY_CARE_PROVIDER_SITE_OTHER): Payer: Self-pay | Admitting: Family Medicine

## 2020-02-16 DIAGNOSIS — E538 Deficiency of other specified B group vitamins: Secondary | ICD-10-CM

## 2020-02-22 ENCOUNTER — Encounter (INDEPENDENT_AMBULATORY_CARE_PROVIDER_SITE_OTHER): Payer: Self-pay | Admitting: Family Medicine

## 2020-02-22 ENCOUNTER — Ambulatory Visit (INDEPENDENT_AMBULATORY_CARE_PROVIDER_SITE_OTHER): Payer: BC Managed Care – PPO | Admitting: Family Medicine

## 2020-02-22 ENCOUNTER — Other Ambulatory Visit: Payer: Self-pay

## 2020-02-22 VITALS — BP 111/71 | HR 59 | Temp 97.9°F | Ht 63.0 in | Wt 221.0 lb

## 2020-02-22 DIAGNOSIS — R7303 Prediabetes: Secondary | ICD-10-CM | POA: Diagnosis not present

## 2020-02-22 DIAGNOSIS — Z6839 Body mass index (BMI) 39.0-39.9, adult: Secondary | ICD-10-CM

## 2020-02-22 NOTE — Progress Notes (Signed)
Chief Complaint:   OBESITY Cynthia Short is here to discuss her progress with her obesity treatment plan along with follow-up of her obesity related diagnoses. Cynthia Short is on keeping a food journal and adhering to recommended goals of 1200-1400 calories and 80+ grams of protein daily and states she is following her eating plan approximately 80-85% of the time. Cynthia Short states she is doing yard work for 2-3 hours 2 times per week.  Today's visit was #: 23 Starting weight: 225 lbs Starting date: 08/26/2018 Today's weight: 221 lbs Today's date: 02/22/2020 Total lbs lost to date: 4 Total lbs lost since last in-office visit: 4  Interim History: Cynthia Short has done well with journaling and she is doing better with her weight loss efforts. Her activity has increased with yard work at her mother's.  Subjective:   1. Pre-diabetes Cynthia Short is on metformin, and she is doing better with diet and exercise.  Assessment/Plan:   1. Pre-diabetes Cynthia Short will continue metformin, and will continue to work on weight loss, diet, exercise, and decreasing simple carbohydrates to help decrease the risk of diabetes. We will recheck labs at her next visit.  2. Class 2 severe obesity with serious comorbidity and body mass index (BMI) of 39.0 to 39.9 in adult, unspecified obesity type (HCC) Cynthia Short is currently in the action stage of change. As such, her goal is to continue with weight loss efforts. She has agreed to keeping a food journal and adhering to recommended goals of 1200-1400 calories and 80+ grams of protein daily.   Exercise goals: As is.  Behavioral modification strategies: meal planning and cooking strategies.  Cynthia Short has agreed to follow-up with our clinic in 3 weeks. She was informed of the importance of frequent follow-up visits to maximize her success with intensive lifestyle modifications for her multiple health conditions.   Objective:   Blood pressure 111/71, pulse (!) 59, temperature 97.9 F (36.6 C),  temperature source Oral, height 5\' 3"  (1.6 m), weight 221 lb (100.2 kg), SpO2 97 %. Body mass index is 39.15 kg/m.  General: Cooperative, alert, well developed, in no acute distress. HEENT: Conjunctivae and lids unremarkable. Cardiovascular: Regular rhythm.  Lungs: Normal work of breathing. Neurologic: No focal deficits.   Lab Results  Component Value Date   CREATININE 0.71 10/31/2019   BUN 12 10/31/2019   NA 139 10/31/2019   K 4.2 10/31/2019   CL 99 10/31/2019   CO2 24 10/31/2019   Lab Results  Component Value Date   ALT 16 10/31/2019   AST 17 10/31/2019   ALKPHOS 129 (H) 10/31/2019   BILITOT <0.2 10/31/2019   Lab Results  Component Value Date   HGBA1C 6.2 (H) 10/31/2019   HGBA1C 6.4 (H) 02/15/2019   HGBA1C 6.4 (H) 11/23/2018   HGBA1C 6.3 (H) 08/26/2018   Lab Results  Component Value Date   INSULIN 20.8 10/31/2019   INSULIN 18.5 02/15/2019   INSULIN 24.9 11/23/2018   INSULIN 14.9 08/26/2018   Lab Results  Component Value Date   TSH 1.990 10/31/2019   Lab Results  Component Value Date   CHOL 131 08/26/2018   HDL 42 08/26/2018   LDLCALC 71 08/26/2018   TRIG 91 08/26/2018   Lab Results  Component Value Date   WBC 7.3 08/26/2018   HGB 12.6 08/26/2018   HCT 39.0 08/26/2018   MCV 88 08/26/2018   No results found for: IRON, TIBC, FERRITIN  Attestation Statements:   Reviewed by clinician on day of visit: allergies, medications, problem  list, medical history, surgical history, family history, social history, and previous encounter notes.  Time spent on visit including pre-visit chart review and post-visit care and charting was 20 minutes.    I, Trixie Dredge, am acting as transcriptionist for Dennard Nip, MD.  I have reviewed the above documentation for accuracy and completeness, and I agree with the above. -  Dennard Nip, MD

## 2020-03-07 ENCOUNTER — Telehealth (INDEPENDENT_AMBULATORY_CARE_PROVIDER_SITE_OTHER): Payer: BC Managed Care – PPO | Admitting: Family Medicine

## 2020-03-15 ENCOUNTER — Ambulatory Visit (INDEPENDENT_AMBULATORY_CARE_PROVIDER_SITE_OTHER): Payer: BC Managed Care – PPO | Admitting: Family Medicine

## 2020-03-15 ENCOUNTER — Encounter (INDEPENDENT_AMBULATORY_CARE_PROVIDER_SITE_OTHER): Payer: Self-pay | Admitting: Family Medicine

## 2020-03-15 ENCOUNTER — Other Ambulatory Visit: Payer: Self-pay

## 2020-03-15 VITALS — BP 126/75 | HR 62 | Temp 98.2°F | Ht 63.0 in | Wt 226.0 lb

## 2020-03-15 DIAGNOSIS — E119 Type 2 diabetes mellitus without complications: Secondary | ICD-10-CM | POA: Diagnosis not present

## 2020-03-15 DIAGNOSIS — E559 Vitamin D deficiency, unspecified: Secondary | ICD-10-CM

## 2020-03-15 DIAGNOSIS — Z6841 Body Mass Index (BMI) 40.0 and over, adult: Secondary | ICD-10-CM | POA: Diagnosis not present

## 2020-03-15 MED ORDER — VITAMIN D (ERGOCALCIFEROL) 1.25 MG (50000 UNIT) PO CAPS: 50000.0000 [IU] | ORAL_CAPSULE | ORAL | 0 refills | Status: DC

## 2020-03-15 NOTE — Progress Notes (Signed)
Chief Complaint:   OBESITY Cynthia Short is here to discuss her progress with her obesity treatment plan along with follow-up of her obesity related diagnoses. Cynthia Short is on keeping a food journal and adhering to recommended goals of 1200-1400 calories and 80+ grams of protein and states she is following her eating plan approximately 50% of the time. Cynthia Short states she is doing 0 minutes 0 times per week.  Today's visit was #: 24 Starting weight: 225 lbs Starting date: 08/26/2018 Today's weight: 226 lbs Today's date: 03/15/2020 Total lbs lost to date: 0 Total lbs lost since last in-office visit: 0  Interim History: Cynthia Short reports doing well the first week, and then the second week she was dealing with multiple family issues and she ate out frequently. She feels that her stress levels will reduce in the future.  Subjective:   1. Type 2 diabetes mellitus without complication, without long-term current use of insulin (HCC) Cynthia Short was unable to tolerate metformin 1,500 mg, and her dose was reduced to 1,000 mg. Her morning BGs are consistently in the 110's. She denies episodes of hypoglycemia.  2. Vitamin D deficiency Cynthia Short is tolerating prescription Vit D supplementation.  Assessment/Plan:   1. Type 2 diabetes mellitus without complication, without long-term current use of insulin (HCC) Good blood sugar control is important to decrease the likelihood of diabetic complications such as nephropathy, neuropathy, limb loss, blindness, coronary artery disease, and death. Intensive lifestyle modification including diet, exercise and weight loss are the first line of treatment for diabetes. Cynthia Short will continue her journaling meal plan, and we will check labs today.  - T3 - T4, free - TSH - Vitamin B12 - Lipid Panel With LDL/HDL Ratio - Insulin, random - Hemoglobin A1c - Comprehensive metabolic panel - CBC with Differential/Platelet  2. Vitamin D deficiency Low Vitamin D level contributes to fatigue  and are associated with obesity, breast, and colon cancer. We will refill  prescription Vitamin D for 1 month. Cynthia Short will follow-up for routine testing of Vitamin D, at least 2-3 times per year to avoid over-replacement. We will check labs today.  - VITAMIN D 25 Hydroxy (Vit-D Deficiency, Fractures)  - Vitamin D, Ergocalciferol, (DRISDOL) 1.25 MG (50000 UNIT) CAPS capsule; Take 1 capsule (50,000 Units total) by mouth every 7 (seven) days.  Dispense: 4 capsule; Refill: 0  3. Class 3 severe obesity with serious comorbidity and body mass index (BMI) of 40.0 to 44.9 in adult, unspecified obesity type (HCC) Cynthia Short is currently in the action stage of change. As such, her goal is to continue with weight loss efforts. She has agreed to keeping a food journal and adhering to recommended goals of 1200-1400 calories and 80+ grams of protein daily.   Exercise goals: No exercise has been prescribed at this time.  Behavioral modification strategies: no skipping meals, meal planning and cooking strategies, travel eating strategies and keeping a strict food journal.  Cynthia Short has agreed to follow-up with our clinic in 3 weeks. She was informed of the importance of frequent follow-up visits to maximize her success with intensive lifestyle modifications for her multiple health conditions.   Cynthia Short was informed we would discuss her lab results at her next visit unless there is a critical issue that needs to be addressed sooner. Cynthia Short agreed to keep her next visit at the agreed upon time to discuss these results.  Objective:   Blood pressure 126/75, pulse 62, temperature 98.2 F (36.8 C), temperature source Oral, height 5\' 3"  (1.6 m),  weight 226 lb (102.5 kg), SpO2 98 %. Body mass index is 40.03 kg/m.  General: Cooperative, alert, well developed, in no acute distress. HEENT: Conjunctivae and lids unremarkable. Cardiovascular: Regular rhythm.  Lungs: Normal work of breathing. Neurologic: No focal deficits.   Lab  Results  Component Value Date   CREATININE 0.71 10/31/2019   BUN 12 10/31/2019   NA 139 10/31/2019   K 4.2 10/31/2019   CL 99 10/31/2019   CO2 24 10/31/2019   Lab Results  Component Value Date   ALT 16 10/31/2019   AST 17 10/31/2019   ALKPHOS 129 (H) 10/31/2019   BILITOT <0.2 10/31/2019   Lab Results  Component Value Date   HGBA1C 6.2 (H) 10/31/2019   HGBA1C 6.4 (H) 02/15/2019   HGBA1C 6.4 (H) 11/23/2018   HGBA1C 6.3 (H) 08/26/2018   Lab Results  Component Value Date   INSULIN 20.8 10/31/2019   INSULIN 18.5 02/15/2019   INSULIN 24.9 11/23/2018   INSULIN 14.9 08/26/2018   Lab Results  Component Value Date   TSH 1.990 10/31/2019   Lab Results  Component Value Date   CHOL 131 08/26/2018   HDL 42 08/26/2018   LDLCALC 71 08/26/2018   TRIG 91 08/26/2018   Lab Results  Component Value Date   WBC 7.3 08/26/2018   HGB 12.6 08/26/2018   HCT 39.0 08/26/2018   MCV 88 08/26/2018   No results found for: IRON, TIBC, FERRITIN  Attestation Statements:   Reviewed by clinician on day of visit: allergies, medications, problem list, medical history, surgical history, family history, social history, and previous encounter notes.   I, Trixie Dredge, am acting as transcriptionist for Dennard Nip, MD.  I have reviewed the above documentation for accuracy and completeness, and I agree with the above. -  Dennard Nip, MD

## 2020-03-16 LAB — CBC WITH DIFFERENTIAL/PLATELET
Basophils Absolute: 0 10*3/uL (ref 0.0–0.2)
Basos: 0 %
EOS (ABSOLUTE): 0.3 10*3/uL (ref 0.0–0.4)
Eos: 4 %
Hematocrit: 38 % (ref 34.0–46.6)
Hemoglobin: 12.2 g/dL (ref 11.1–15.9)
Immature Grans (Abs): 0 10*3/uL (ref 0.0–0.1)
Immature Granulocytes: 0 %
Lymphocytes Absolute: 2.1 10*3/uL (ref 0.7–3.1)
Lymphs: 30 %
MCH: 28.4 pg (ref 26.6–33.0)
MCHC: 32.1 g/dL (ref 31.5–35.7)
MCV: 89 fL (ref 79–97)
Monocytes Absolute: 0.5 10*3/uL (ref 0.1–0.9)
Monocytes: 7 %
Neutrophils Absolute: 4.1 10*3/uL (ref 1.4–7.0)
Neutrophils: 59 %
Platelets: 253 10*3/uL (ref 150–450)
RBC: 4.29 x10E6/uL (ref 3.77–5.28)
RDW: 13 % (ref 11.7–15.4)
WBC: 7.1 10*3/uL (ref 3.4–10.8)

## 2020-03-16 LAB — COMPREHENSIVE METABOLIC PANEL
ALT: 17 IU/L (ref 0–32)
AST: 19 IU/L (ref 0–40)
Albumin/Globulin Ratio: 1.4 (ref 1.2–2.2)
Albumin: 4.2 g/dL (ref 3.8–4.8)
Alkaline Phosphatase: 88 IU/L (ref 39–117)
BUN/Creatinine Ratio: 11 — ABNORMAL LOW (ref 12–28)
BUN: 8 mg/dL (ref 8–27)
Bilirubin Total: <0.2 mg/dL (ref 0.0–1.2)
CO2: 24 mmol/L (ref 20–29)
Calcium: 8.8 mg/dL (ref 8.7–10.3)
Chloride: 100 mmol/L (ref 96–106)
Creatinine, Ser: 0.72 mg/dL (ref 0.57–1.00)
GFR calc Af Amer: 103 mL/min/{1.73_m2} (ref >59)
GFR calc non Af Amer: 89 mL/min/{1.73_m2} (ref >59)
Globulin, Total: 2.9 g/dL (ref 1.5–4.5)
Glucose: 100 mg/dL — ABNORMAL HIGH (ref 65–99)
Potassium: 4.4 mmol/L (ref 3.5–5.2)
Sodium: 143 mmol/L (ref 134–144)
Total Protein: 7.1 g/dL (ref 6.0–8.5)

## 2020-03-16 LAB — HEMOGLOBIN A1C
Est. average glucose Bld gHb Est-mCnc: 140 mg/dL
Hgb A1c MFr Bld: 6.5 % — ABNORMAL HIGH (ref 4.8–5.6)

## 2020-03-16 LAB — LIPID PANEL WITH LDL/HDL RATIO
Cholesterol, Total: 126 mg/dL (ref 100–199)
HDL: 40 mg/dL (ref >39)
LDL Chol Calc (NIH): 69 mg/dL (ref 0–99)
LDL/HDL Ratio: 1.7 ratio (ref 0.0–3.2)
Triglycerides: 91 mg/dL (ref 0–149)
VLDL Cholesterol Cal: 17 mg/dL (ref 5–40)

## 2020-03-16 LAB — T3: T3, Total: 138 ng/dL (ref 71–180)

## 2020-03-16 LAB — T4, FREE: Free T4: 1.38 ng/dL (ref 0.82–1.77)

## 2020-03-16 LAB — INSULIN, RANDOM: INSULIN: 24.6 u[IU]/mL (ref 2.6–24.9)

## 2020-03-16 LAB — VITAMIN B12: Vitamin B-12: 495 pg/mL (ref 232–1245)

## 2020-03-16 LAB — VITAMIN D 25 HYDROXY (VIT D DEFICIENCY, FRACTURES): Vit D, 25-Hydroxy: 29.6 ng/mL — ABNORMAL LOW (ref 30.0–100.0)

## 2020-03-16 LAB — TSH: TSH: 1.36 u[IU]/mL (ref 0.450–4.500)

## 2020-03-26 ENCOUNTER — Ambulatory Visit: Payer: BC Managed Care – PPO

## 2020-04-09 ENCOUNTER — Ambulatory Visit (INDEPENDENT_AMBULATORY_CARE_PROVIDER_SITE_OTHER): Payer: BC Managed Care – PPO | Admitting: Family Medicine

## 2020-04-10 ENCOUNTER — Other Ambulatory Visit (INDEPENDENT_AMBULATORY_CARE_PROVIDER_SITE_OTHER): Payer: Self-pay | Admitting: Family Medicine

## 2020-04-10 DIAGNOSIS — E559 Vitamin D deficiency, unspecified: Secondary | ICD-10-CM

## 2020-04-16 ENCOUNTER — Ambulatory Visit (INDEPENDENT_AMBULATORY_CARE_PROVIDER_SITE_OTHER): Payer: BC Managed Care – PPO | Admitting: Family Medicine

## 2020-04-19 ENCOUNTER — Other Ambulatory Visit: Payer: Self-pay

## 2020-04-19 ENCOUNTER — Encounter (INDEPENDENT_AMBULATORY_CARE_PROVIDER_SITE_OTHER): Payer: Self-pay | Admitting: Family Medicine

## 2020-04-19 ENCOUNTER — Ambulatory Visit (INDEPENDENT_AMBULATORY_CARE_PROVIDER_SITE_OTHER): Payer: BC Managed Care – PPO | Admitting: Family Medicine

## 2020-04-19 VITALS — BP 113/71 | HR 61 | Temp 97.6°F | Ht 63.0 in | Wt 225.0 lb

## 2020-04-19 DIAGNOSIS — Z9189 Other specified personal risk factors, not elsewhere classified: Secondary | ICD-10-CM

## 2020-04-19 DIAGNOSIS — E1159 Type 2 diabetes mellitus with other circulatory complications: Secondary | ICD-10-CM | POA: Diagnosis not present

## 2020-04-19 DIAGNOSIS — I152 Hypertension secondary to endocrine disorders: Secondary | ICD-10-CM

## 2020-04-19 DIAGNOSIS — E559 Vitamin D deficiency, unspecified: Secondary | ICD-10-CM

## 2020-04-19 DIAGNOSIS — E538 Deficiency of other specified B group vitamins: Secondary | ICD-10-CM

## 2020-04-19 DIAGNOSIS — I10 Essential (primary) hypertension: Secondary | ICD-10-CM

## 2020-04-19 DIAGNOSIS — E785 Hyperlipidemia, unspecified: Secondary | ICD-10-CM

## 2020-04-19 DIAGNOSIS — Z6841 Body Mass Index (BMI) 40.0 and over, adult: Secondary | ICD-10-CM

## 2020-04-19 DIAGNOSIS — E1169 Type 2 diabetes mellitus with other specified complication: Secondary | ICD-10-CM | POA: Diagnosis not present

## 2020-04-19 MED ORDER — VITAMIN D (ERGOCALCIFEROL) 1.25 MG (50000 UNIT) PO CAPS: 50000.0000 [IU] | ORAL_CAPSULE | ORAL | 0 refills | Status: DC

## 2020-04-19 MED ORDER — CYANOCOBALAMIN 1000 MCG PO TABS: 1000.0000 ug | ORAL_TABLET | Freq: Every day | ORAL | 0 refills | Status: DC

## 2020-04-19 NOTE — Progress Notes (Signed)
Chief Complaint:   OBESITY Cynthia Short is here to discuss her progress with her obesity treatment plan along with follow-up of her obesity related diagnoses. Cynthia Short is on the Category 2 Plan and states she is following her eating plan approximately 50% of the time. Cynthia Short states she is doing 0 minutes 0 times per week.  Today's visit was #: 25 Starting weight: 225 lbs Starting date: 08/26/2018 Today's weight: 225 lbs Today's date: 04/19/2020 Total lbs lost to date: 0 Total lbs lost since last in-office visit: 1  Interim History: Cynthia Short has started to do better with weight loss. She would like to discuss her labs and make a plan for the next 3 weeks.  Subjective:   1. Vitamin D deficiency [place]'s Vit D level is worsening, and still below goal. She denies nausea or vomiting, but she notes fatigue. I discussed labs with the patient today.  2. B12 nutritional deficiency Cynthia Short's B12 level is at goal, and she denies nausea or vomiting. I discussed labs with the patient today.  3. Hypertension associated with diabetes (Cynthia Short) Cynthia Short's blood pressure is very well controlled on her medications. She denies chest pain or lightheadedness. She is working on diet.  4. Hyperlipidemia associated with type 2 diabetes mellitus (Cynthia Short) Cynthia Short is stable on Zocor, and her recent LDL was below 70. She denies chest pain or myalgias. I discussed labs with the patient today.  5. Type 2 diabetes mellitus with other specified complication, without long-term current use of insulin (Cynthia Short) Cynthia Short's level are worsening, an her recent A1c was slightly elevated at 6.5. She is ready to get back on track with her diet. I discussed labs with the patient today.  6. At risk for osteoporosis Cynthia Short is at higher risk of osteopenia and osteoporosis due to Vitamin D deficiency.   Assessment/Plan:   1. Vitamin D deficiency Low Vitamin D level contributes to fatigue and are associated with obesity, breast, and colon cancer. We will  refill prescription Vitamin D for 1 month. Cynthia Short will follow-up for routine testing of Vitamin D, at least 2-3 times per year to avoid over-replacement.  - Vitamin D, Ergocalciferol, (DRISDOL) 1.25 MG (50000 UNIT) CAPS capsule; Take 1 capsule (50,000 Units total) by mouth every 7 (seven) days.  Dispense: 4 capsule; Refill: 0  2. B12 nutritional deficiency The diagnosis was reviewed with the patient. We will continue to monitor. We will refill cyanocobalamin for 1 month. Orders and follow up as documented in patient record.  - cyanocobalamin ([facility] VITAMIN B12) 1000 MCG tablet; Take 1 tablet (1,000 mcg total) by mouth daily.  Dispense: 30 tablet; Refill: 0  3. Hypertension associated with diabetes (Cynthia Short) Cynthia Short will continue her diet and medications, and will continue to work on healthy weight loss and exercise to improve blood pressure control. We will watch for signs of hypotension as she continues her lifestyle modifications.  4. Hyperlipidemia associated with type 2 diabetes mellitus (Cynthia Short) Cardiovascular risk and specific lipid/LDL goals reviewed. We discussed several lifestyle modifications today. Cynthia Short will continue her medications and diet, and will continue to work on exercise and weight loss efforts. Orders and follow up as documented in patient record.   5. Type 2 diabetes mellitus with other specified complication, without long-term current use of insulin (Cynthia Short) Good blood sugar control is important to decrease the likelihood of diabetic complications such as nephropathy, neuropathy, limb loss, blindness, coronary artery disease, and death. Intensive lifestyle modification including diet, exercise and weight loss are the first line of  treatment for diabetes. Cynthia Short will continue her diet and medications, and we will recheck labs in 3 months.  6. At risk for osteoporosis Cynthia Short was given approximately 15 minutes of osteoporosis prevention counseling today. Cynthia Short is at risk for osteopenia and  osteoporosis due to her Vitamin D deficiency. She was encouraged to take her Vitamin D and follow her higher calcium diet and increase strengthening exercise to help strengthen her bones and decrease her risk of osteopenia and osteoporosis.  Repetitive spaced learning was employed today to elicit superior memory formation and behavioral change.  7. Class 3 severe obesity with serious comorbidity and body mass index (BMI) of 40.0 to 44.9 in adult, unspecified obesity type (Cynthia Short) Cynthia Short is currently in the action stage of change. As such, her goal is to continue with weight loss efforts. She has agreed to change to keeping a food journal and adhering to recommended goals of 1200-1300 calories and 75+ grams of protein daily.   Behavioral modification strategies: increasing lean protein intake and holiday eating strategies .  Cynthia Short has agreed to follow-up with our clinic in 3 weeks. She was informed of the importance of frequent follow-up visits to maximize her success with intensive lifestyle modifications for her multiple health conditions.   Objective:   Blood pressure 113/71, pulse 61, temperature 97.6 F (36.4 C), temperature source Oral, height 5\' 3"  (1.6 m), weight 225 lb (102.1 kg), SpO2 97 %. Body mass index is 39.86 kg/m.  General: Cooperative, alert, well developed, in no acute distress. HEENT: Conjunctivae and lids unremarkable. Cardiovascular: Regular rhythm.  Lungs: Normal work of breathing. Neurologic: No focal deficits.   Lab Results  Component Value Date   CREATININE 0.72 03/15/2020   BUN 8 03/15/2020   NA 143 03/15/2020   K 4.4 03/15/2020   CL 100 03/15/2020   CO2 24 03/15/2020   Lab Results  Component Value Date   ALT 17 03/15/2020   AST 19 03/15/2020   ALKPHOS 88 03/15/2020   BILITOT <0.2 03/15/2020   Lab Results  Component Value Date   HGBA1C 6.5 (H) 03/15/2020   HGBA1C 6.2 (H) 10/31/2019   HGBA1C 6.4 (H) 02/15/2019   HGBA1C 6.4 (H) 11/23/2018   HGBA1C 6.3  (H) 08/26/2018   Lab Results  Component Value Date   INSULIN 24.6 03/15/2020   INSULIN 20.8 10/31/2019   INSULIN 18.5 02/15/2019   INSULIN 24.9 11/23/2018   INSULIN 14.9 08/26/2018   Lab Results  Component Value Date   TSH 1.360 03/15/2020   Lab Results  Component Value Date   CHOL 126 03/15/2020   HDL 40 03/15/2020   LDLCALC 69 03/15/2020   TRIG 91 03/15/2020   Lab Results  Component Value Date   WBC 7.1 03/15/2020   HGB 12.2 03/15/2020   HCT 38.0 03/15/2020   MCV 89 03/15/2020   PLT 253 03/15/2020   No results found for: IRON, TIBC, FERRITIN  Attestation Statements:   Reviewed by clinician on day of visit: allergies, medications, problem list, medical history, surgical history, family history, social history, and previous encounter notes.   I, Trixie Dredge, am acting as transcriptionist for Dennard Nip, MD.  I have reviewed the above documentation for accuracy and completeness, and I agree with the above. -  Dennard Nip, MD

## 2020-05-13 ENCOUNTER — Other Ambulatory Visit (INDEPENDENT_AMBULATORY_CARE_PROVIDER_SITE_OTHER): Payer: Self-pay | Admitting: Family Medicine

## 2020-05-13 DIAGNOSIS — E559 Vitamin D deficiency, unspecified: Secondary | ICD-10-CM

## 2020-05-15 ENCOUNTER — Other Ambulatory Visit (INDEPENDENT_AMBULATORY_CARE_PROVIDER_SITE_OTHER): Payer: Self-pay | Admitting: Family Medicine

## 2020-05-15 DIAGNOSIS — E538 Deficiency of other specified B group vitamins: Secondary | ICD-10-CM

## 2020-05-16 ENCOUNTER — Other Ambulatory Visit: Payer: Self-pay

## 2020-05-16 ENCOUNTER — Ambulatory Visit (INDEPENDENT_AMBULATORY_CARE_PROVIDER_SITE_OTHER): Payer: BC Managed Care – PPO | Admitting: Family Medicine

## 2020-05-16 ENCOUNTER — Encounter (INDEPENDENT_AMBULATORY_CARE_PROVIDER_SITE_OTHER): Payer: Self-pay | Admitting: Family Medicine

## 2020-05-16 VITALS — BP 150/74 | HR 53 | Temp 98.3°F | Ht 63.0 in | Wt 223.0 lb

## 2020-05-16 DIAGNOSIS — E538 Deficiency of other specified B group vitamins: Secondary | ICD-10-CM

## 2020-05-16 DIAGNOSIS — Z9189 Other specified personal risk factors, not elsewhere classified: Secondary | ICD-10-CM | POA: Diagnosis not present

## 2020-05-16 DIAGNOSIS — I10 Essential (primary) hypertension: Secondary | ICD-10-CM | POA: Diagnosis not present

## 2020-05-16 DIAGNOSIS — E559 Vitamin D deficiency, unspecified: Secondary | ICD-10-CM

## 2020-05-16 DIAGNOSIS — E66812 Obesity, class 2: Secondary | ICD-10-CM

## 2020-05-16 DIAGNOSIS — Z6839 Body mass index (BMI) 39.0-39.9, adult: Secondary | ICD-10-CM

## 2020-05-16 MED ORDER — CYANOCOBALAMIN 1000 MCG PO TABS: 1000.0000 ug | ORAL_TABLET | Freq: Every day | ORAL | 0 refills | Status: DC

## 2020-05-16 MED ORDER — VITAMIN D (ERGOCALCIFEROL) 1.25 MG (50000 UNIT) PO CAPS: 50000.0000 [IU] | ORAL_CAPSULE | ORAL | 0 refills | Status: DC

## 2020-05-16 NOTE — Progress Notes (Signed)
Chief Complaint:   OBESITY Cynthia Short is here to discuss her progress with her obesity treatment plan along with follow-up of her obesity related diagnoses. Cynthia Short is on keeping a food journal and adhering to recommended goals of 1200-1300 calories and 75+ grams of protein daily and states she is following her eating plan approximately 75% of the time. [name] states she is doing 0 minutes 0 times per week.  Today's visit was #: 26 Starting weight: 225 lbs Starting date: 08/26/2018 Today's weight: 223 lbs Today's date: 05/16/2020 Total lbs lost to date: 2 Total lbs lost since last in-office visit: 2  Interim History: Cynthia Short continues to do well with weight loss. She is concentrating on more portion controlled packaging to help keep her on track, and this seems to be working well.  Subjective:   1. Essential hypertension Cynthia Short is on atenolol and Cozaar. Her blood pressure is normally well controlled with systolic generally running in the 120's. Today's readings is an abortion.  2. B12 deficiency Cynthia Short is stable on B12 supplementation, and is tolerating supplements and B12 rich foods well.  3. Vitamin D deficiency Cynthia Short's last Vit D level dropped but she is taking Vit D regularly now, and her level should be improving. She denies nausea or vomiting.  4. At risk for heart disease Cynthia Short is at a higher than average risk for cardiovascular disease due to obesity.   Assessment/Plan:   1. Essential hypertension Cynthia Short will continue working on healthy weight loss, diet, and exercise to improve blood pressure control. We will watch for signs of hypotension as she continues her lifestyle modifications. We will recheck her blood pressure in 3 to 4 weeks.  2. B12 deficiency The diagnosis was reviewed with the patient. We will continue to monitor. We will refill Vit B12 for 1 month. Orders and follow up as documented in patient record.  - cyanocobalamin ([facility] VITAMIN B12) 1000 MCG tablet; Take 1  tablet (1,000 mcg total) by mouth daily.  Dispense: 30 tablet; Refill: 0  3. Vitamin D deficiency Low Vitamin D level contributes to fatigue and are associated with obesity, breast, and colon cancer. We will refill prescription Vitamin D for 1 month. [name] will follow-up for routine testing of Vitamin D, at least 2-3 times per year to avoid over-replacement.  - Vitamin D, Ergocalciferol, (DRISDOL) 1.25 MG (50000 UNIT) CAPS capsule; Take 1 capsule (50,000 Units total) by mouth every 7 (seven) days.  Dispense: 4 capsule; Refill: 0  4. At risk for heart disease Cynthia Short was given approximately 15 minutes of coronary artery disease prevention counseling today. She is 64 y.o. female and has risk factors for heart disease including obesity. We discussed intensive lifestyle modifications today with an emphasis on specific weight loss instructions and strategies.   Repetitive spaced learning was employed today to elicit superior memory formation and behavioral change.  5. Class 2 severe obesity with serious comorbidity and body mass index (BMI) of 39.0 to 39.9 in adult, unspecified obesity type (HCC) Cynthia Short is currently in the action stage of change. As such, her goal is to continue with weight loss efforts. She has agreed to keeping a food journal and adhering to recommended goals of 1200-1300 calories and 75+ grams of protein daily.   Behavioral modification strategies: meal planning and cooking strategies and keeping a strict food journal.  Cynthia Short has agreed to follow-up with our clinic in 3 to 4 weeks. She was informed of the importance of frequent follow-up visits to maximize her  success with intensive lifestyle modifications for her multiple health conditions.   Objective:   Blood pressure (!) 150/74, pulse (!) 53, temperature 98.3 F (36.8 C), temperature source Oral, height 5\' 3"  (1.6 m), weight 223 lb (101.2 kg), SpO2 98 %. Body mass index is 39.5 kg/m.  General: Cooperative, alert, well  developed, in no acute distress. HEENT: Conjunctivae and lids unremarkable. Cardiovascular: Regular rhythm.  Lungs: Normal work of breathing. Neurologic: No focal deficits.   Lab Results  Component Value Date   CREATININE 0.72 03/15/2020   BUN 8 03/15/2020   NA 143 03/15/2020   K 4.4 03/15/2020   CL 100 03/15/2020   CO2 24 03/15/2020   Lab Results  Component Value Date   ALT 17 03/15/2020   AST 19 03/15/2020   ALKPHOS 88 03/15/2020   BILITOT <0.2 03/15/2020   Lab Results  Component Value Date   HGBA1C 6.5 (H) 03/15/2020   HGBA1C 6.2 (H) 10/31/2019   HGBA1C 6.4 (H) 02/15/2019   HGBA1C 6.4 (H) 11/23/2018   HGBA1C 6.3 (H) 08/26/2018   Lab Results  Component Value Date   INSULIN 24.6 03/15/2020   INSULIN 20.8 10/31/2019   INSULIN 18.5 02/15/2019   INSULIN 24.9 11/23/2018   INSULIN 14.9 08/26/2018   Lab Results  Component Value Date   TSH 1.360 03/15/2020   Lab Results  Component Value Date   CHOL 126 03/15/2020   HDL 40 03/15/2020   LDLCALC 69 03/15/2020   TRIG 91 03/15/2020   Lab Results  Component Value Date   WBC 7.1 03/15/2020   HGB 12.2 03/15/2020   HCT 38.0 03/15/2020   MCV 89 03/15/2020   PLT 253 03/15/2020   No results found for: IRON, TIBC, FERRITIN  Attestation Statements:   Reviewed by clinician on day of visit: allergies, medications, problem list, medical history, surgical history, family history, social history, and previous encounter notes.   I, Trixie Dredge, am acting as transcriptionist for Dennard Nip, MD.  I have reviewed the above documentation for accuracy and completeness, and I agree with the above. -  Dennard Nip, MD

## 2020-06-08 LAB — LIPID PANEL
Cholesterol: 128 (ref 0–200)
HDL: 34 — AB (ref 35–70)
LDL Cholesterol: 70
Triglycerides: 137 (ref 40–160)

## 2020-06-08 LAB — COMPREHENSIVE METABOLIC PANEL
Albumin: 3.8 (ref 3.5–5.0)
Calcium: 8.9 (ref 8.7–10.7)
GFR calc Af Amer: 99
GFR calc non Af Amer: 82

## 2020-06-08 LAB — BASIC METABOLIC PANEL
BUN: 9 (ref 4–21)
CO2: 30 — AB (ref 13–22)
Chloride: 101 (ref 99–108)
Creatinine: 0.7 (ref 0.5–1.1)
Glucose: 128
Potassium: 4.2 (ref 3.4–5.3)
Sodium: 138 (ref 137–147)

## 2020-06-08 LAB — VITAMIN D 25 HYDROXY (VIT D DEFICIENCY, FRACTURES): Vit D, 25-Hydroxy: 36.5

## 2020-06-08 LAB — HEPATIC FUNCTION PANEL
ALT: 16 (ref 7–35)
AST: 17 (ref 13–35)
Bilirubin, Total: 0.4

## 2020-06-08 LAB — HEMOGLOBIN A1C: Hemoglobin A1C: 6.7

## 2020-06-08 LAB — VITAMIN B12: Vitamin B-12: 328

## 2020-06-08 LAB — MICROALBUMIN, URINE: Microalb, Ur: <0.7

## 2020-06-08 LAB — TSH: TSH: 1.29 (ref 0.41–5.90)

## 2020-06-10 ENCOUNTER — Other Ambulatory Visit (INDEPENDENT_AMBULATORY_CARE_PROVIDER_SITE_OTHER): Payer: Self-pay | Admitting: Family Medicine

## 2020-06-10 DIAGNOSIS — E559 Vitamin D deficiency, unspecified: Secondary | ICD-10-CM

## 2020-06-12 ENCOUNTER — Other Ambulatory Visit (INDEPENDENT_AMBULATORY_CARE_PROVIDER_SITE_OTHER): Payer: Self-pay | Admitting: Family Medicine

## 2020-06-12 DIAGNOSIS — E538 Deficiency of other specified B group vitamins: Secondary | ICD-10-CM

## 2020-06-13 ENCOUNTER — Other Ambulatory Visit: Payer: Self-pay

## 2020-06-13 ENCOUNTER — Encounter (INDEPENDENT_AMBULATORY_CARE_PROVIDER_SITE_OTHER): Payer: Self-pay | Admitting: Family Medicine

## 2020-06-13 ENCOUNTER — Ambulatory Visit (INDEPENDENT_AMBULATORY_CARE_PROVIDER_SITE_OTHER): Payer: BC Managed Care – PPO | Admitting: Family Medicine

## 2020-06-13 VITALS — BP 143/59 | HR 60 | Temp 98.0°F | Ht 63.0 in | Wt 225.0 lb

## 2020-06-13 DIAGNOSIS — E559 Vitamin D deficiency, unspecified: Secondary | ICD-10-CM

## 2020-06-13 DIAGNOSIS — E538 Deficiency of other specified B group vitamins: Secondary | ICD-10-CM | POA: Diagnosis not present

## 2020-06-13 DIAGNOSIS — Z9189 Other specified personal risk factors, not elsewhere classified: Secondary | ICD-10-CM

## 2020-06-13 DIAGNOSIS — I10 Essential (primary) hypertension: Secondary | ICD-10-CM | POA: Diagnosis not present

## 2020-06-13 DIAGNOSIS — Z6841 Body Mass Index (BMI) 40.0 and over, adult: Secondary | ICD-10-CM

## 2020-06-13 MED ORDER — VITAMIN D (ERGOCALCIFEROL) 1.25 MG (50000 UNIT) PO CAPS: 50000.0000 [IU] | ORAL_CAPSULE | ORAL | 1 refills | Status: DC

## 2020-06-13 MED ORDER — CYANOCOBALAMIN 1000 MCG PO TABS: 1000.0000 ug | ORAL_TABLET | Freq: Every day | ORAL | 1 refills | Status: DC

## 2020-06-19 NOTE — Progress Notes (Signed)
Chief Complaint:   OBESITY Cynthia Short is here to discuss her progress with her obesity treatment plan along with follow-up of her obesity related diagnoses. Cynthia Short is on keeping a food journal and adhering to recommended goals of 1200-1300 calories and 75+ grams of protein daily and states she is following her eating plan approximately 50% of the time. Cynthia Short states she is doing yard work.   Today's visit was #: 66 Starting weight: 225 lbs Starting date: 08/26/2018 Today's weight: 225 lbs Today's date: 06/13/2020 Total lbs lost to date: 0 Total lbs lost since last in-office visit: 0  Interim History: Cynthia Short has been struggling to follow her plan closely especially with her mother's recent health issues. She was still mindful however.  Subjective:   1. B12 deficiency Cynthia Short is stable on B12, and she had labs done at her primary care physician's office at [facility], but she doesn't have her results back yet. She denies changes in fatigue.  2. Vitamin D deficiency Cynthia Short is stable on Vit D, and she had labs done recently with her primary care physician's office at [facility].  3. Essential hypertension Cynthia Short's blood pressure has been elevated at her last 2 visits. She is being followed by her primary care physician, but she has struggled with her eating plan more.  4. At risk for nausea Cynthia Short is at risk for nausea due to possible over-replacement of Vit D.  Assessment/Plan:   1. B12 deficiency The diagnosis was reviewed with the patient. We will continue to monitor. Cynthia Short will continue B12, and we will refill for 2 months. We will obtain her recent labs from her primary care physician. Orders and follow up as documented in patient record.  - cyanocobalamin ([facility] VITAMIN B12) 1000 MCG tablet; Take 1 tablet (1,000 mcg total) by mouth daily.  Dispense: 30 tablet; Refill: 1  2. Vitamin D deficiency Low Vitamin D level contributes to fatigue and are associated with obesity, breast, and colon cancer.  We will refill prescription Vitamin D for 2 months. Cynthia Short will follow-up for routine testing of Vitamin D, at least 2-3 times per year to avoid over-replacement.  - Vitamin D, Ergocalciferol, (DRISDOL) 1.25 MG (50000 UNIT) CAPS capsule; Take 1 capsule (50,000 Units total) by mouth every 7 (seven) days.  Dispense: 4 capsule; Refill: 1  3. Essential hypertension Cynthia Short is working on healthy weight loss and exercise to improve blood pressure control. We will watch for signs of hypotension as she continues her lifestyle modifications. We will follow up on her blood pressure at her next office visit.  4. At risk for nausea Cynthia Short was given approximately 15 minutes of nausea prevention counseling today. Cynthia Short is at risk for nausea due to her new or current medication. She was encouraged to titrate her medication slowly, make sure to stay hydrated, eat smaller portions throughout the day, and avoid high fat meals.   5. Class 3 severe obesity with serious comorbidity and body mass index (BMI) of 40.0 to 44.9 in adult, unspecified obesity type (HCC) Cynthia Short is currently in the action stage of change. As such, her goal is to continue with weight loss efforts. She has agreed to practicing portion control and making smarter food choices, such as increasing vegetables and decreasing simple carbohydrates.   Exercise goals: As is.  Behavioral modification strategies: meal planning and cooking strategies.  Cynthia Short has agreed to follow-up with our clinic in 6 to 8 weeks. She was informed of the importance of frequent follow-up visits  to maximize her success with intensive lifestyle modifications for her multiple health conditions.   Objective:   Blood pressure (!) 143/59, pulse 60, temperature 98 F (36.7 C), temperature source Oral, height 5\' 3"  (1.6 m), weight 225 lb (102.1 kg), SpO2 99 %. Body mass index is 39.86 kg/m.  General: Cooperative, alert, well developed, in no acute distress. HEENT:  Conjunctivae and lids unremarkable. Cardiovascular: Regular rhythm.  Lungs: Normal work of breathing. Neurologic: No focal deficits.   Lab Results  Component Value Date   CREATININE 0.72 03/15/2020   BUN 8 03/15/2020   NA 143 03/15/2020   K 4.4 03/15/2020   CL 100 03/15/2020   CO2 24 03/15/2020   Lab Results  Component Value Date   ALT 17 03/15/2020   AST 19 03/15/2020   ALKPHOS 88 03/15/2020   BILITOT <0.2 03/15/2020   Lab Results  Component Value Date   HGBA1C 6.5 (H) 03/15/2020   HGBA1C 6.2 (H) 10/31/2019   HGBA1C 6.4 (H) 02/15/2019   HGBA1C 6.4 (H) 11/23/2018   HGBA1C 6.3 (H) 08/26/2018   Lab Results  Component Value Date   INSULIN 24.6 03/15/2020   INSULIN 20.8 10/31/2019   INSULIN 18.5 02/15/2019   INSULIN 24.9 11/23/2018   INSULIN 14.9 08/26/2018   Lab Results  Component Value Date   TSH 1.360 03/15/2020   Lab Results  Component Value Date   CHOL 126 03/15/2020   HDL 40 03/15/2020   LDLCALC 69 03/15/2020   TRIG 91 03/15/2020   Lab Results  Component Value Date   WBC 7.1 03/15/2020   HGB 12.2 03/15/2020   HCT 38.0 03/15/2020   MCV 89 03/15/2020   PLT 253 03/15/2020   No results found for: IRON, TIBC, FERRITIN  Attestation Statements:   Reviewed by clinician on day of visit: allergies, medications, problem list, medical history, surgical history, family history, social history, and previous encounter notes.   I, Trixie Dredge, am acting as transcriptionist for Dennard Nip, MD.  I have reviewed the above documentation for accuracy and completeness, and I agree with the above. -  Dennard Nip, MD

## 2020-06-21 ENCOUNTER — Encounter (INDEPENDENT_AMBULATORY_CARE_PROVIDER_SITE_OTHER): Payer: Self-pay

## 2020-07-06 ENCOUNTER — Other Ambulatory Visit (INDEPENDENT_AMBULATORY_CARE_PROVIDER_SITE_OTHER): Payer: Self-pay | Admitting: Family Medicine

## 2020-07-06 DIAGNOSIS — E559 Vitamin D deficiency, unspecified: Secondary | ICD-10-CM

## 2020-07-08 ENCOUNTER — Other Ambulatory Visit (INDEPENDENT_AMBULATORY_CARE_PROVIDER_SITE_OTHER): Payer: Self-pay | Admitting: Family Medicine

## 2020-07-08 DIAGNOSIS — E538 Deficiency of other specified B group vitamins: Secondary | ICD-10-CM

## 2020-08-07 ENCOUNTER — Other Ambulatory Visit: Payer: Self-pay | Admitting: Obstetrics & Gynecology

## 2020-08-07 DIAGNOSIS — Z1231 Encounter for screening mammogram for malignant neoplasm of breast: Secondary | ICD-10-CM

## 2020-08-08 ENCOUNTER — Ambulatory Visit (INDEPENDENT_AMBULATORY_CARE_PROVIDER_SITE_OTHER): Payer: BC Managed Care – PPO | Admitting: Family Medicine

## 2020-09-03 ENCOUNTER — Ambulatory Visit (INDEPENDENT_AMBULATORY_CARE_PROVIDER_SITE_OTHER): Payer: BC Managed Care – PPO | Admitting: Family Medicine

## 2020-09-03 ENCOUNTER — Other Ambulatory Visit: Payer: Self-pay

## 2020-09-03 ENCOUNTER — Encounter (INDEPENDENT_AMBULATORY_CARE_PROVIDER_SITE_OTHER): Payer: Self-pay | Admitting: Family Medicine

## 2020-09-03 VITALS — BP 124/75 | HR 70 | Temp 98.0°F | Ht 63.0 in | Wt 228.0 lb

## 2020-09-03 DIAGNOSIS — E1169 Type 2 diabetes mellitus with other specified complication: Secondary | ICD-10-CM

## 2020-09-03 DIAGNOSIS — E559 Vitamin D deficiency, unspecified: Secondary | ICD-10-CM | POA: Diagnosis not present

## 2020-09-03 DIAGNOSIS — Z794 Long term (current) use of insulin: Secondary | ICD-10-CM

## 2020-09-03 DIAGNOSIS — E538 Deficiency of other specified B group vitamins: Secondary | ICD-10-CM

## 2020-09-03 DIAGNOSIS — Z9189 Other specified personal risk factors, not elsewhere classified: Secondary | ICD-10-CM | POA: Diagnosis not present

## 2020-09-03 DIAGNOSIS — Z6841 Body Mass Index (BMI) 40.0 and over, adult: Secondary | ICD-10-CM

## 2020-09-03 MED ORDER — CYANOCOBALAMIN 1000 MCG PO TABS: 1000.0000 ug | ORAL_TABLET | Freq: Every day | ORAL | 1 refills | Status: DC

## 2020-09-03 MED ORDER — VITAMIN D (ERGOCALCIFEROL) 1.25 MG (50000 UNIT) PO CAPS: 50000.0000 [IU] | ORAL_CAPSULE | ORAL | 1 refills | Status: DC

## 2020-09-04 NOTE — Progress Notes (Signed)
Chief Complaint:   OBESITY Cynthia Short is here to discuss her progress with her obesity treatment plan along with follow-up of her obesity related diagnoses. Laurice is on practicing portion control and making smarter food choices, such as increasing vegetables and decreasing simple carbohydrates and states she is following her eating plan approximately 50% of the time. Cynthia Short states she is walking 8,000 steps daily.  Today's visit was #: 28 Starting weight: 225 lbs Starting date: 08/26/2018 Today's weight: 228 lbs Today's date: 09/03/2020 Total lbs lost to date: 0 Total lbs lost since last in-office visit: 0  Interim History: Cynthia Short has gotten off track with journaling and she has gained weight. She is ready to get back on track with her eating plan, and she would like to look at other eating plan options.  Subjective:   1. Vitamin B 12 deficiency Cynthia Short is on B12, and her last labs had decreased. She still notes fatigue.  2. Vitamin D deficiency Cynthia Short is on Vit D, but her level is not yet at goal. She still notes fatigue.  3. Type 2 diabetes mellitus with other specified complication, with long-term current use of insulin (HCC) Cynthia Short is on metformin, and has questions about her insulin and liver function, normal GFR. She would like to know about meal timing.  4. At risk for hyperglycemia Cynthia Short is at increased risk for hyperglycemia due to changes in diet, diagnosis of diabetes, and/or insulin use.   Assessment/Plan:   1. Vitamin B 12 deficiency The diagnosis was reviewed with the patient. We will continue to monitor. Gitel agreed to continue taking B12 1,000 mcg daily #30 with no refills. Orders and follow up as documented in patient record.  2. Vitamin D deficiency Low Vitamin D level contributes to fatigue and are associated with obesity, breast, and colon cancer. We will refill prescription Vitamin D for 2 months. will follow-up for routine testing of Vitamin D, at least 2-3 times  per year to avoid over-replacement.  - Vitamin D, Ergocalciferol, (DRISDOL) 1.25 MG (50000 UNIT) CAPS capsule; Take 1 capsule (50,000 Units total) by mouth every 7 (seven) days.  Dispense: 4 capsule; Refill: 1  3. Type 2 diabetes mellitus with other specified complication, with long-term current use of insulin (HCC) Good blood sugar control is important to decrease the likelihood of diabetic complications such as nephropathy, neuropathy, limb loss, blindness, coronary artery disease, and death. Intensive lifestyle modification including diet, exercise and weight loss are the first line of treatment for diabetes. Cele was encouraged to follow a low carbohydrate plan, and eat smaller but more frequent meals to help deal with polyphagia.  4. At risk for hyperglycemia Cynthia Short was given approximately 15 minutes of counseling today regarding prevention of hyperglycemia. She was advised of hyperglycemia causes and the fact hyperglycemia is often asymptomatic. Cynthia Short was instructed to avoid skipping meals, eat regular protein rich meals and schedule low calorie but protein rich snacks as needed.   Repetitive spaced learning was employed today to elicit superior memory formation and behavioral change  5. Class 3 severe obesity with serious comorbidity and body mass index (BMI) of 40.0 to 44.9 in adult, unspecified obesity type (HCC) Cynthia Short is currently in the action stage of change. As such, her goal is to continue with weight loss efforts. She has agreed to change to following a lower carbohydrate, vegetable and lean protein rich diet plan.   Exercise goals: As is.  Behavioral modification strategies: increasing lean protein intake.  Cynthia Short has agreed  to follow-up with our clinic in 2 to 3 weeks. She was informed of the importance of frequent follow-up visits to maximize her success with intensive lifestyle modifications for her multiple health conditions.   Objective:   Blood pressure 124/75, pulse 70,  temperature 98 F (36.7 C), height 5\' 3"  (1.6 m), weight 228 lb (103.4 kg), SpO2 98 %. Body mass index is 40.39 kg/m.  General: Cooperative, alert, well developed, in no acute distress. HEENT: Conjunctivae and lids unremarkable. Cardiovascular: Regular rhythm.  Lungs: Normal work of breathing. Neurologic: No focal deficits.   Lab Results  Component Value Date   CREATININE 0.7 06/08/2020   BUN 9 06/08/2020   NA 138 06/08/2020   K 4.2 06/08/2020   CL 101 06/08/2020   CO2 30 (A) 06/08/2020   Lab Results  Component Value Date   ALT 16 06/08/2020   AST 17 06/08/2020   ALKPHOS 88 03/15/2020   BILITOT <0.2 03/15/2020   Lab Results  Component Value Date   HGBA1C 6.7 06/08/2020   HGBA1C 6.5 (H) 03/15/2020   HGBA1C 6.2 (H) 10/31/2019   HGBA1C 6.4 (H) 02/15/2019   HGBA1C 6.4 (H) 11/23/2018   Lab Results  Component Value Date   INSULIN 24.6 03/15/2020   INSULIN 20.8 10/31/2019   INSULIN 18.5 02/15/2019   INSULIN 24.9 11/23/2018   INSULIN 14.9 08/26/2018   Lab Results  Component Value Date   TSH 1.29 06/08/2020   Lab Results  Component Value Date   CHOL 128 06/08/2020   HDL 34 (A) 06/08/2020   LDLCALC 70 06/08/2020   TRIG 137 06/08/2020   Lab Results  Component Value Date   WBC 7.1 03/15/2020   HGB 12.2 03/15/2020   HCT 38.0 03/15/2020   MCV 89 03/15/2020   PLT 253 03/15/2020   No results found for: IRON, TIBC, FERRITIN  Attestation Statements:   Reviewed by clinician on day of visit: allergies, medications, problem list, medical history, surgical history, family history, social history, and previous encounter notes.   I, Trixie Dredge, am acting as transcriptionist for Dennard Nip, MD.  I have reviewed the above documentation for accuracy and completeness, and I agree with the above. -  Dennard Nip, MD

## 2020-09-26 ENCOUNTER — Ambulatory Visit (INDEPENDENT_AMBULATORY_CARE_PROVIDER_SITE_OTHER): Payer: BC Managed Care – PPO | Admitting: Family Medicine

## 2020-10-04 ENCOUNTER — Other Ambulatory Visit (INDEPENDENT_AMBULATORY_CARE_PROVIDER_SITE_OTHER): Payer: Self-pay | Admitting: Family Medicine

## 2020-10-04 ENCOUNTER — Encounter (INDEPENDENT_AMBULATORY_CARE_PROVIDER_SITE_OTHER): Payer: Self-pay

## 2020-10-04 DIAGNOSIS — E559 Vitamin D deficiency, unspecified: Secondary | ICD-10-CM

## 2020-10-04 NOTE — Telephone Encounter (Signed)
MyChart message sent to pt

## 2020-10-24 ENCOUNTER — Ambulatory Visit (INDEPENDENT_AMBULATORY_CARE_PROVIDER_SITE_OTHER): Payer: BC Managed Care – PPO | Admitting: Family Medicine

## 2020-11-21 ENCOUNTER — Ambulatory Visit
Admission: RE | Admit: 2020-11-21 | Discharge: 2020-11-21 | Disposition: A | Discharge status: Home / Self Care | Payer: BC Managed Care – PPO | Source: Ambulatory Visit | Attending: Obstetrics & Gynecology | Admitting: Obstetrics & Gynecology

## 2020-11-21 ENCOUNTER — Other Ambulatory Visit: Payer: Self-pay

## 2020-11-21 ENCOUNTER — Other Ambulatory Visit: Payer: Self-pay | Admitting: Body Imaging

## 2020-11-21 DIAGNOSIS — Z1231 Encounter for screening mammogram for malignant neoplasm of breast: Secondary | ICD-10-CM

## 2020-11-21 DIAGNOSIS — Z9189 Other specified personal risk factors, not elsewhere classified: Secondary | ICD-10-CM

## 2020-11-26 ENCOUNTER — Encounter (INDEPENDENT_AMBULATORY_CARE_PROVIDER_SITE_OTHER): Payer: Self-pay | Admitting: Family Medicine

## 2020-11-26 ENCOUNTER — Ambulatory Visit (INDEPENDENT_AMBULATORY_CARE_PROVIDER_SITE_OTHER): Payer: BC Managed Care – PPO | Admitting: Family Medicine

## 2020-11-26 ENCOUNTER — Other Ambulatory Visit: Payer: Self-pay

## 2020-11-26 VITALS — BP 144/76 | HR 66 | Temp 98.2°F | Ht 63.0 in | Wt 233.0 lb

## 2020-11-26 DIAGNOSIS — I10 Essential (primary) hypertension: Secondary | ICD-10-CM

## 2020-11-26 DIAGNOSIS — Z6841 Body Mass Index (BMI) 40.0 and over, adult: Secondary | ICD-10-CM

## 2020-11-26 NOTE — Progress Notes (Signed)
Chief Complaint:   OBESITY Cynthia Short is here to discuss her progress with her obesity treatment plan along with follow-up of her obesity related diagnoses. Cynthia Short is on following a lower carbohydrate, vegetable and lean protein rich diet plan and states she is following her eating plan approximately 50% of the time. Cynthia Short states she is doing 0 minutes 0 times per week.  Today's visit was #: 69 Starting weight: 225 lbs Starting date: 08/26/2018 Today's weight: 233 lbs Today's date: 11/26/2020 Total lbs lost to date: 0 Total lbs lost since last in-office visit: 0  Interim History: Cynthia Short has been struggling to stay on track. She is busy with a lot of extra challenges, and she hasn't been able to concentrate on weight loss.  Subjective:   1. Essential hypertension Cynthia Short's blood pressure is elevated today. She has been out of her blood pressure medications for some of last week, but she is now taking it again. She has had increased stress lately.   Assessment/Plan:   1. Essential hypertension Cynthia Short will continue her medications, and will continue working on healthy weight loss, diet, and exercise to improve blood pressure control. We will watch for signs of hypotension as she continues her lifestyle modifications. We will recheck her blood pressure in 1 month.  2. Class 3 severe obesity with serious comorbidity and body mass index (BMI) of 40.0 to 44.9 in adult, unspecified obesity type (HCC) Cynthia Short is not currently in the action stage of change. As such, her goal is to maintain weight for now. She has agreed to practicing portion control and making smarter food choices, such as increasing vegetables and decreasing simple carbohydrates.   Cynthia Short is not in the action stage of change, we will work on maintenance and will reassess her readiness to change next month.  Behavioral modification strategies: increasing lean protein intake and holiday eating strategies .  Cynthia Short has agreed to  follow-up with our clinic in 3 to 4 weeks. She was informed of the importance of frequent follow-up visits to maximize her success with intensive lifestyle modifications for her multiple health conditions.   Objective:   Blood pressure (!) 144/76, pulse 66, temperature 98.2 F (36.8 C), height 5\' 3"  (1.6 m), weight 233 lb (105.7 kg), SpO2 97 %. Body mass index is 41.27 kg/m.  General: Cooperative, alert, well developed, in no acute distress. HEENT: Conjunctivae and lids unremarkable. Cardiovascular: Regular rhythm.  Lungs: Normal work of breathing. Neurologic: No focal deficits.   Lab Results  Component Value Date   CREATININE 0.7 06/08/2020   BUN 9 06/08/2020   NA 138 06/08/2020   K 4.2 06/08/2020   CL 101 06/08/2020   CO2 30 (A) 06/08/2020   Lab Results  Component Value Date   ALT 16 06/08/2020   AST 17 06/08/2020   ALKPHOS 88 03/15/2020   BILITOT <0.2 03/15/2020   Lab Results  Component Value Date   HGBA1C 6.7 06/08/2020   HGBA1C 6.5 (H) 03/15/2020   HGBA1C 6.2 (H) 10/31/2019   HGBA1C 6.4 (H) 02/15/2019   HGBA1C 6.4 (H) 11/23/2018   Lab Results  Component Value Date   INSULIN 24.6 03/15/2020   INSULIN 20.8 10/31/2019   INSULIN 18.5 02/15/2019   INSULIN 24.9 11/23/2018   INSULIN 14.9 08/26/2018   Lab Results  Component Value Date   TSH 1.29 06/08/2020   Lab Results  Component Value Date   CHOL 128 06/08/2020   HDL 34 (A) 06/08/2020   LDLCALC 70 06/08/2020  TRIG 137 06/08/2020   Lab Results  Component Value Date   WBC 7.1 03/15/2020   HGB 12.2 03/15/2020   HCT 38.0 03/15/2020   MCV 89 03/15/2020   PLT 253 03/15/2020   No results found for: IRON, TIBC, FERRITIN  Attestation Statements:   Reviewed by clinician on day of visit: allergies, medications, problem list, medical history, surgical history, family history, social history, and previous encounter notes.  Time spent on visit including pre-visit chart review and post-visit care and charting  was 30 minutes.    I, Trixie Dredge, am acting as transcriptionist for Dennard Nip, MD.  I have reviewed the above documentation for accuracy and completeness, and I agree with the above. -  Dennard Nip, MD

## 2020-12-04 LAB — VITAMIN B12: Vitamin B-12: 282

## 2020-12-04 LAB — LIPID PANEL
Cholesterol: 116 (ref 0–200)
HDL: 35 (ref 35–70)
LDL Cholesterol: 61
LDl/HDL Ratio: 3.3
Triglycerides: 103 (ref 40–160)

## 2020-12-04 LAB — BASIC METABOLIC PANEL
BUN: 10 (ref 4–21)
CO2: 27 — AB (ref 13–22)
Creatinine: 0.7 (ref <=1.1)
Glucose: 128
Potassium: 4.1 (ref 3.4–5.3)
Sodium: 138 (ref 137–147)

## 2020-12-04 LAB — HEPATIC FUNCTION PANEL
ALT: 17 (ref 7–35)
AST: 15 (ref 13–35)
Bilirubin, Total: 0.4

## 2020-12-04 LAB — COMPREHENSIVE METABOLIC PANEL
Albumin: 3.6 (ref 3.5–5.0)
Calcium: 8.9 (ref 8.7–10.7)
GFR calc Af Amer: 105
GFR calc non Af Amer: 87

## 2020-12-04 LAB — HEMOGLOBIN A1C: Hemoglobin A1C: 7

## 2020-12-04 LAB — TSH: TSH: 1.17 (ref <=5.90)

## 2020-12-04 LAB — VITAMIN D 25 HYDROXY (VIT D DEFICIENCY, FRACTURES): Vit D, 25-Hydroxy: 27.2

## 2020-12-24 ENCOUNTER — Encounter (INDEPENDENT_AMBULATORY_CARE_PROVIDER_SITE_OTHER): Payer: Self-pay | Admitting: Family Medicine

## 2020-12-24 ENCOUNTER — Ambulatory Visit (INDEPENDENT_AMBULATORY_CARE_PROVIDER_SITE_OTHER): Payer: BC Managed Care – PPO | Admitting: Family Medicine

## 2020-12-24 ENCOUNTER — Other Ambulatory Visit: Payer: Self-pay

## 2020-12-24 VITALS — BP 117/69 | HR 68 | Temp 97.9°F | Ht 63.0 in | Wt 227.0 lb

## 2020-12-24 DIAGNOSIS — R82998 Other abnormal findings in urine: Secondary | ICD-10-CM

## 2020-12-24 DIAGNOSIS — Z6841 Body Mass Index (BMI) 40.0 and over, adult: Secondary | ICD-10-CM

## 2020-12-24 DIAGNOSIS — Z9189 Other specified personal risk factors, not elsewhere classified: Secondary | ICD-10-CM

## 2020-12-24 DIAGNOSIS — I10 Essential (primary) hypertension: Secondary | ICD-10-CM

## 2020-12-25 NOTE — Progress Notes (Signed)
Chief Complaint:   OBESITY Cynthia Short is here to discuss her progress with her obesity treatment plan along with follow-up of her obesity related diagnoses. Cynthia Short is on practicing portion control and making smarter food choices, such as increasing vegetables and decreasing simple carbohydrates and states she is following her eating plan approximately 50% of the time. Cynthia Short states she is doing 0 minutes 0 times per week.  Today's visit was #: 62 Starting weight: 225 lbs Starting date: 08/26/2018 Today's weight: 227 lbs Today's date: 12/24/2020 Total lbs lost to date: 0 Total lbs lost since last in-office visit: 6  Interim History: Cynthia Short has done well with weight loss over the holidays. She has been mindful and tried to make better choices and eat smaller amounts of high calorie foods. She states her hunger is mostly controlled.  Subjective:   1. Essential hypertension Cynthia Short's blood pressure is stable today and her blood pressure readings at home are all below 140-90 (mostly 120-130's/70's). She is following up with her primary care physician who she states is considering increasing her medications due to some elevated blood pressure reading in the office.   2. Foamy urine Cynthia Short notes intermittent foam in her urine to varying degrees for the last 2 weeks. She has a family history of renal failure so she started looking things up on the internet, and she is very worried. She has a history of well controlled diabetes mellitus. Her last urine:MAB was 20. She had labs done at her primary care physician's office last month but no urine test.  3. At risk for dehydration Cynthia Short is at risk for dehydration due to weight loss.  Assessment/Plan:   1. Essential hypertension Cynthia Short will continue to work on diet, exercise, and healthy weight loss to improve blood pressure control. She will continue to monitor blood pressure closely and watch for signs of hypotension as she continues her lifestyle  modifications. We will wait and see what her primary care physician decides about medication changes.  2. Foamy urine Cynthia Short was educated on causes for foamy urine and she was encouraged to discuss this with her primary care physician. She was reassured that her kidneys have been functioning well in the past and to try not to worry, but be evaluated. Her blood pressure is stable today, and she will work on diet and weight loss and continue an adequate but not high protein diet.  3. At risk for dehydration Cynthia Short was given approximately 15 minutes dehydration prevention counseling today. Cynthia Short is at risk for dehydration due to weight loss and current medication(s). She was encouraged to continue to increase her water intake and monitor fluid status to avoid dehydration as well as weight loss plateaus.   4. Class 3 severe obesity with serious comorbidity and body mass index (BMI) of 40.0 to 44.9 in adult, unspecified obesity type (HCC) Cynthia Short is currently in the action stage of change. As such, her goal is to continue with weight loss efforts. She has agreed to the Category 2 Plan.   Behavioral modification strategies: increasing water intake.  Cynthia Short has agreed to follow-up with our clinic in 2 to 3 weeks. She was informed of the importance of frequent follow-up visits to maximize her success with intensive lifestyle modifications for her multiple health conditions.   Objective:   Blood pressure 117/69, pulse 68, temperature 97.9 F (36.6 C), height 5\' 3"  (1.6 m), weight 227 lb (103 kg), SpO2 98 %. Body mass index is 40.21 kg/m.  General: Cooperative,  alert, well developed, in no acute distress. HEENT: Conjunctivae and lids unremarkable. Cardiovascular: Regular rhythm.  Lungs: Normal work of breathing. Neurologic: No focal deficits.   Lab Results  Component Value Date   CREATININE 0.7 06/08/2020   BUN 9 06/08/2020   NA 138 06/08/2020   K 4.2 06/08/2020   CL 101 06/08/2020   CO2 30 (A)  06/08/2020   Lab Results  Component Value Date   ALT 16 06/08/2020   AST 17 06/08/2020   ALKPHOS 88 03/15/2020   BILITOT <0.2 03/15/2020   Lab Results  Component Value Date   HGBA1C 6.7 06/08/2020   HGBA1C 6.5 (H) 03/15/2020   HGBA1C 6.2 (H) 10/31/2019   HGBA1C 6.4 (H) 02/15/2019   HGBA1C 6.4 (H) 11/23/2018   Lab Results  Component Value Date   INSULIN 24.6 03/15/2020   INSULIN 20.8 10/31/2019   INSULIN 18.5 02/15/2019   INSULIN 24.9 11/23/2018   INSULIN 14.9 08/26/2018   Lab Results  Component Value Date   TSH 1.29 06/08/2020   Lab Results  Component Value Date   CHOL 128 06/08/2020   HDL 34 (A) 06/08/2020   LDLCALC 70 06/08/2020   TRIG 137 06/08/2020   Lab Results  Component Value Date   WBC 7.1 03/15/2020   HGB 12.2 03/15/2020   HCT 38.0 03/15/2020   MCV 89 03/15/2020   PLT 253 03/15/2020   No results found for: IRON, TIBC, FERRITIN  Attestation Statements:   Reviewed by clinician on day of visit: allergies, medications, problem list, medical history, surgical history, family history, social history, and previous encounter notes.   I, Trixie Dredge, am acting as transcriptionist for Dennard Nip, MD.  I have reviewed the above documentation for accuracy and completeness, and I agree with the above. -  Dennard Nip, MD

## 2020-12-26 ENCOUNTER — Ambulatory Visit (INDEPENDENT_AMBULATORY_CARE_PROVIDER_SITE_OTHER): Payer: BC Managed Care – PPO | Admitting: Family Medicine

## 2021-01-15 ENCOUNTER — Encounter (INDEPENDENT_AMBULATORY_CARE_PROVIDER_SITE_OTHER): Payer: Self-pay

## 2021-01-15 ENCOUNTER — Encounter (INDEPENDENT_AMBULATORY_CARE_PROVIDER_SITE_OTHER): Payer: Self-pay | Admitting: Family Medicine

## 2021-01-15 ENCOUNTER — Other Ambulatory Visit: Payer: Self-pay

## 2021-01-15 ENCOUNTER — Ambulatory Visit (INDEPENDENT_AMBULATORY_CARE_PROVIDER_SITE_OTHER): Payer: BC Managed Care – PPO | Admitting: Family Medicine

## 2021-01-15 VITALS — BP 121/71 | HR 67 | Temp 98.3°F | Ht 63.0 in | Wt 223.0 lb

## 2021-01-15 DIAGNOSIS — Z794 Long term (current) use of insulin: Secondary | ICD-10-CM

## 2021-01-15 DIAGNOSIS — E1169 Type 2 diabetes mellitus with other specified complication: Secondary | ICD-10-CM

## 2021-01-15 DIAGNOSIS — Z9189 Other specified personal risk factors, not elsewhere classified: Secondary | ICD-10-CM | POA: Diagnosis not present

## 2021-01-15 DIAGNOSIS — Z6839 Body mass index (BMI) 39.0-39.9, adult: Secondary | ICD-10-CM

## 2021-01-15 DIAGNOSIS — E538 Deficiency of other specified B group vitamins: Secondary | ICD-10-CM | POA: Diagnosis not present

## 2021-01-15 MED ORDER — CYANOCOBALAMIN 1000 MCG PO TABS: 1000.0000 ug | ORAL_TABLET | Freq: Every day | ORAL | 0 refills | Status: DC

## 2021-01-23 NOTE — Progress Notes (Signed)
Chief Complaint:   OBESITY Cynthia Short is here to discuss her progress with her obesity treatment plan along with follow-up of her obesity related diagnoses. Cynthia Short is on the Category 2 Plan and states she is following her eating plan approximately 90% of the time. Cynthia Short states she is walking for 30 minutes 2-3 times per week.  Today's visit was #: 23 Starting weight: 225 lbs Starting date: 08/26/2018 Today's weight: 223 lbs Today's date: 01/15/2021 Total lbs lost to date: 2 Total lbs lost since last in-office visit: 4  Interim History: Cynthia Short has done better with decreasing PM snacking and trying to increase protein. She is back to losing weight and she feels she does better with food journaling. She is exercising regularly.  Subjective:   1. Vitamin B 12 deficiency Cynthia Short is stable on B12 prescription, and is eating a B12 rich diet plan. She requests a refill today.  2. Type 2 diabetes mellitus with other specified complication, with long-term current use of insulin (HCC) Cynthia Short is struggling with some polyphagia and some decreased BGs in the early AM. She is doing well with diet and weight loss.  3. At increased risk of exposure to COVID-19 virus Cynthia Short is at higher risk of COVID-19 infection due to higher infection rates locally.  Assessment/Plan:   1. Vitamin B 12 deficiency The diagnosis was reviewed with the patient. We will refill B12 for 1 month, and will continue to monitor. Carmen will continue to follow up as directed. Orders and follow up as documented in patient record.  - cyanocobalamin ([facility] VITAMIN B12) 1000 MCG tablet; Take 1 tablet (1,000 mcg total) by mouth daily.  Dispense: 30 tablet; Refill: 0  2. Type 2 diabetes mellitus with other specified complication, with long-term current use of insulin (HCC) Good blood sugar control is important to decrease the likelihood of diabetic complications such as nephropathy, neuropathy, limb loss, blindness, coronary artery disease,  and death. Intensive lifestyle modification including diet, exercise and weight loss are the first line of treatment for diabetes. We will check Urine:MAB today. Cynthia Short will continue metformin 1 tablet PO q AM and 1/2 tablet q dinner. She will consider a GLP-1 at her next visit. She is to have a small protein snack before bed.  - Microalbumin / creatinine urine ratio  3. At increased risk of exposure to COVID-19 virus Cynthia Short was given approximately 15 minutes of COVID prevention counseling today.  4. Class 2 severe obesity with serious comorbidity and body mass index (BMI) of 39.0 to 39.9 in adult, unspecified obesity type (HCC) Cynthia Short is currently in the action stage of change. As such, her goal is to continue with weight loss efforts. She has agreed to keeping a food journal and adhering to recommended goals of 1200-1300 calories and 75+ grams of protein daily.   Exercise goals: As is.  Behavioral modification strategies: increasing lean protein intake and meal planning and cooking strategies.  Cynthia Short has agreed to follow-up with our clinic in 3 to 4 weeks. She was informed of the importance of frequent follow-up visits to maximize her success with intensive lifestyle modifications for her multiple health conditions.   Objective:   Blood pressure 121/71, pulse 67, temperature 98.3 F (36.8 C), height 5\' 3"  (1.6 m), weight 223 lb (101.2 kg), SpO2 98 %. Body mass index is 39.5 kg/m.  General: Cooperative, alert, well developed, in no acute distress. HEENT: Conjunctivae and lids unremarkable. Cardiovascular: Regular rhythm.  Lungs: Normal work of breathing. Neurologic: No focal  deficits.   Lab Results  Component Value Date   CREATININE 0.7 12/04/2020   BUN 10 12/04/2020   NA 138 12/04/2020   K 4.1 12/04/2020   CL 101 06/08/2020   CO2 27 (A) 12/04/2020   Lab Results  Component Value Date   ALT 17 12/04/2020   AST 15 12/04/2020   ALKPHOS 88 03/15/2020   BILITOT <0.2 03/15/2020    Lab Results  Component Value Date   HGBA1C 7.0 12/04/2020   HGBA1C 6.7 06/08/2020   HGBA1C 6.5 (H) 03/15/2020   HGBA1C 6.2 (H) 10/31/2019   HGBA1C 6.4 (H) 02/15/2019   Lab Results  Component Value Date   INSULIN 24.6 03/15/2020   INSULIN 20.8 10/31/2019   INSULIN 18.5 02/15/2019   INSULIN 24.9 11/23/2018   INSULIN 14.9 08/26/2018   Lab Results  Component Value Date   TSH 1.17 12/04/2020   Lab Results  Component Value Date   CHOL 116 12/04/2020   HDL 35 12/04/2020   LDLCALC 61 12/04/2020   TRIG 103 12/04/2020   Lab Results  Component Value Date   WBC 7.1 03/15/2020   HGB 12.2 03/15/2020   HCT 38.0 03/15/2020   MCV 89 03/15/2020   PLT 253 03/15/2020   No results found for: IRON, TIBC, FERRITIN  Attestation Statements:   Reviewed by clinician on day of visit: allergies, medications, problem list, medical history, surgical history, family history, social history, and previous encounter notes.   I, Trixie Dredge, am acting as transcriptionist for Dennard Nip, MD.  I have reviewed the above documentation for accuracy and completeness, and I agree with the above. -  Dennard Nip, MD

## 2021-02-11 ENCOUNTER — Ambulatory Visit (INDEPENDENT_AMBULATORY_CARE_PROVIDER_SITE_OTHER): Payer: BC Managed Care – PPO | Admitting: Family Medicine

## 2021-02-18 ENCOUNTER — Other Ambulatory Visit: Payer: Self-pay

## 2021-02-18 ENCOUNTER — Encounter (INDEPENDENT_AMBULATORY_CARE_PROVIDER_SITE_OTHER): Payer: Self-pay | Admitting: Family Medicine

## 2021-02-18 ENCOUNTER — Telehealth (INDEPENDENT_AMBULATORY_CARE_PROVIDER_SITE_OTHER): Payer: BC Managed Care – PPO | Admitting: Family Medicine

## 2021-02-18 DIAGNOSIS — E1169 Type 2 diabetes mellitus with other specified complication: Secondary | ICD-10-CM

## 2021-02-18 DIAGNOSIS — Z794 Long term (current) use of insulin: Secondary | ICD-10-CM | POA: Diagnosis not present

## 2021-02-18 DIAGNOSIS — Z6839 Body mass index (BMI) 39.0-39.9, adult: Secondary | ICD-10-CM

## 2021-02-19 NOTE — Progress Notes (Signed)
TeleHealth Visit:  Due to the COVID-19 pandemic, this visit was completed with telemedicine (audio/video) technology to reduce patient and provider exposure as well as to preserve personal protective equipment.   Cynthia Short has verbally consented to this TeleHealth visit. The patient is located at home, the provider is located at the [facility] office. The participants in this visit include the listed provider and patient. The visit was conducted today via MyChart video.   Chief Complaint: OBESITY Cynthia Short is here to discuss her progress with her obesity treatment plan along with follow-up of her obesity related diagnoses. Cynthia Short is on keeping a food journal and adhering to recommended goals of 1200-1300 calories and 75+ grams of protein daily and states she is following her eating plan approximately 50% of the time. Cynthia Short states she is doing 0 minutes 0 times per week.  Today's visit was #: 62 Starting weight: 225 lbs Starting date: 08/26/2018  Interim History: Cynthia Short's weight at home was mostly unchanged from previously. She has what appears to be sinusitis, her COVID, Flu, and RSV were negative, and she is on doxycycline. She hasn't been able to eat as much and her protein has likely decreased. She is trying to avoid decreasing her RMR due to inadequate nutrition.  Subjective:   1. Type 2 diabetes mellitus with other specified complication, with long-term current use of insulin (HCC) Cynthia Short has been sick so her glucose readings have likely increased. She also did some celebration eating a couple of weeks ago, but otherwise she has been mindful of her food choices. She denies signs of hypoglycemia.  Assessment/Plan:   1. Type 2 diabetes mellitus with other specified complication, with long-term current use of insulin (HCC) Good blood sugar control is important to decrease the likelihood of diabetic complications such as nephropathy, neuropathy, limb loss, blindness, coronary  artery disease, and death. Intensive lifestyle modification including diet, exercise and weight loss are the first line of treatment for diabetes. Cynthia Short will continue diet and exercise, and we will plan to recheck labs in approximately 1 month to follow up on her progress.  2. Class 2 severe obesity with serious comorbidity and body mass index (BMI) of 39.0 to 39.9 in adult, unspecified obesity type (HCC) Cynthia Short is currently in the action stage of change. As such, her goal is to continue with weight loss efforts. She has agreed to keeping a food journal and adhering to recommended goals of 1200-1300 calories and 75+ grams of protein daily.   Cynthia Short to continue being mindful and will get back to more structure once she is feeling better.  Exercise goals: No exercise has been prescribed at this time.  Behavioral modification strategies: increasing lean protein intake and increasing water intake.  Cynthia Short has agreed to follow-up with our clinic in 3 weeks. She was informed of the importance of frequent follow-up visits to maximize her success with intensive lifestyle modifications for her multiple health conditions.  Objective:   VITALS: Per patient if applicable, see vitals. GENERAL: Alert and in no acute distress. CARDIOPULMONARY: No increased WOB. Speaking in clear sentences.  PSYCH: Pleasant and cooperative. Speech normal rate and rhythm. Affect is appropriate. Insight and judgement are appropriate. Attention is focused, linear, and appropriate.  NEURO: Oriented as arrived to appointment on time with no prompting.   Lab Results  Component Value Date   CREATININE 0.7 12/04/2020   BUN 10 12/04/2020   NA 138 12/04/2020   K 4.1 12/04/2020   CL 101 06/08/2020  CO2 27 (A) 12/04/2020   Lab Results  Component Value Date   ALT 17 12/04/2020   AST 15 12/04/2020   ALKPHOS 88 03/15/2020   BILITOT <0.2 03/15/2020   Lab Results  Component Value Date   HGBA1C 7.0 12/04/2020   HGBA1C 6.7  06/08/2020   HGBA1C 6.5 (H) 03/15/2020   HGBA1C 6.2 (H) 10/31/2019   HGBA1C 6.4 (H) 02/15/2019   Lab Results  Component Value Date   INSULIN 24.6 03/15/2020   INSULIN 20.8 10/31/2019   INSULIN 18.5 02/15/2019   INSULIN 24.9 11/23/2018   INSULIN 14.9 08/26/2018   Lab Results  Component Value Date   TSH 1.17 12/04/2020   Lab Results  Component Value Date   CHOL 116 12/04/2020   HDL 35 12/04/2020   LDLCALC 61 12/04/2020   TRIG 103 12/04/2020   Lab Results  Component Value Date   WBC 7.1 03/15/2020   HGB 12.2 03/15/2020   HCT 38.0 03/15/2020   MCV 89 03/15/2020   PLT 253 03/15/2020   No results found for: IRON, TIBC, FERRITIN  Attestation Statements:   Reviewed by clinician on day of visit: allergies, medications, problem list, medical history, surgical history, family history, social history, and previous encounter notes.    I, Trixie Dredge, am acting as transcriptionist for Dennard Nip, MD.  I have reviewed the above documentation for accuracy and completeness, and I agree with the above. - Dennard Nip, MD

## 2021-02-22 ENCOUNTER — Ambulatory Visit: Payer: BC Managed Care – PPO | Admitting: Orthopedic Surgery

## 2021-03-12 ENCOUNTER — Ambulatory Visit (INDEPENDENT_AMBULATORY_CARE_PROVIDER_SITE_OTHER): Payer: Self-pay | Admitting: Family Medicine

## 2021-03-20 ENCOUNTER — Ambulatory Visit: Payer: Self-pay

## 2021-03-20 ENCOUNTER — Ambulatory Visit: Payer: BC Managed Care – PPO | Admitting: Orthopedic Surgery

## 2021-03-20 DIAGNOSIS — M1712 Unilateral primary osteoarthritis, left knee: Secondary | ICD-10-CM | POA: Diagnosis not present

## 2021-03-20 DIAGNOSIS — M25562 Pain in left knee: Secondary | ICD-10-CM

## 2021-03-20 DIAGNOSIS — M79641 Pain in right hand: Secondary | ICD-10-CM | POA: Diagnosis not present

## 2021-03-20 MED ORDER — MELOXICAM 15 MG PO TABS: 15.0000 mg | ORAL_TABLET | Freq: Every day | ORAL | 0 refills | Status: DC

## 2021-03-23 ENCOUNTER — Encounter: Payer: Self-pay | Admitting: Orthopedic Surgery

## 2021-03-23 NOTE — Progress Notes (Signed)
Office Visit Note   Patient: Cynthia Short           Date of Birth: Feb 23, 1956           MRN: [id number] Visit Date: 03/20/2021 Requested by: Gaynelle Arabian, MD [address] [place],  [place] [address] PCP: Gaynelle Arabian, MD  Subjective: Chief Complaint  Patient presents with  . Left Knee - Pain  . Right Hand - Pain    HPI: Cynthia Short is a 65 y.o. female who presents to the office complaining of right hand and left knee pain.  Patient reports left knee pain over the last 4 to 5 months.  Localizes pain to the medial aspect of the knee.  Pain is worse with sitting and going to a standing position.  She has weakness of the left knee but no frank instability.  Denies any mechanical locking symptoms.  She does wake with pain on occasion but not every night.  She has occasional flareups of her pain but 50% of the time she says it is okay whereas 50% of the time she has true discomfort.  She denies any groin pain or left leg radicular pain.  Denies any history of left knee surgery.  She does have history of diabetes with last A1c 7.0.  Patient also reports right hand pain that she localizes to the index finger.  She has pain in the volar aspect of the index finger that she localizes most around the A1 pulley.  She denies any triggering or any history of injury.  Denies any locking of the finger.  She has difficulty with obtaining full flexion but no difficulty with full extension.  This has been going on for several months and seems to be worsening.  She has never had a trigger finger before.              ROS: All systems reviewed are negative as they relate to the chief complaint within the history of present illness.  Patient denies fevers or chills.  Assessment & Plan: Visit Diagnoses:  1. Unilateral primary osteoarthritis, left knee   2. Pain in right hand   3. Left knee pain, unspecified chronicity     Plan: Patient is a 65 year old female who presents  complaining of right hand and left knee pain.  Onset of pain was several months ago for both extremities.  Right hand radiographs were reviewed and no acute findings noted.  Left knee radiographs reviewed with mild joint space narrowing and early osteophyte formation noted through all 3 compartments.  Discussed options available to patient.  Suspect that her right index finger pain is an early trigger finger that has begun with pain in the A1 pulley but no actual triggering.  After discussion of options with patient, plan to prescribe meloxicam No. 30 to take daily as needed and also recommended she try topical Voltaren gel for her index finger.  Follow-up in 4 weeks for clinical recheck.  If no improvement at that time, consider injection into the left knee.  Patient agreed with this plan.  Follow-Up Instructions: No follow-ups on file.   Orders:  Orders Placed This Encounter  Procedures  . XR Hand Complete Right  . XR KNEE 3 VIEW LEFT   Meds ordered this encounter  Medications  . meloxicam (MOBIC) 15 MG tablet    Sig: Take 1 tablet (15 mg total) by mouth daily.    Dispense:  30 tablet    Refill:  0      Procedures: No procedures performed   Clinical Data: No additional findings.  Objective: Vital Signs: There were no vitals taken for this visit.  Physical Exam:  Constitutional: Patient appears well-developed HEENT:  Head: Normocephalic Eyes:EOM are normal Neck: Normal range of motion Cardiovascular: Normal rate Pulmonary/chest: Effort normal Neurologic: Patient is alert Skin: Skin is warm Psychiatric: Patient has normal mood and affect  Ortho Exam: Ortho exam demonstrates left knee with no effusion.  Tenderness over the medial joint line.  No tenderness over the lateral joint line.  Able to perform straight leg raise with extensor mechanism intact.  No pain with hip range of motion.  No tenderness over the quadricep tendon, patella tendon, patella.  No instability to  varus/valgus stress or anterior/posterior drawer.  No calf tenderness.  Negative Homans' sign.  Regarding her right hand, she has moderate tenderness over the A1 pulley of the index finger but no tenderness over the volar aspects of any other fingers.  There is no significant swelling noted throughout the right hand and no erythema is noted.  No triggering is palpable or observable.  No deformity noted.  Negative Tinel's sign.  Specialty Comments:  No specialty comments available.  Imaging: No results found.   PMFS History: Patient Active Problem List   Diagnosis Date Noted  . Dyspnea on exertion 07/15/2012  . Morbid obesity ([place]) 07/15/2012  . GERD (gastroesophageal reflux disease) 07/15/2012  . Diabetes mellitus type 2 in obese ([place]) 07/15/2012  . Hypertension 07/15/2012   Past Medical History:  Diagnosis Date  . Anemia   . Anxiety   . Arthritis   . Asthma   . Chest pain   . Constipation   . Diabetes mellitus   . Dyspnea   . Gallbladder problem   . GERD (gastroesophageal reflux disease)   . Hair loss   . Heart murmur   . Hiatal hernia   . Hip problem   . Hyperlipemia   . Hypertension   . IBS (irritable bowel syndrome)   . Joint pain   . Knee problem   . Leg edema   . Thyroid disease    hypothyroidism  . Tricuspid valve disorder     Family History  Problem Relation Age of Onset  . Cancer Father        lung  . Diabetes Father   . Heart disease Father   . Stroke Father   . Kidney disease Father   . Heart disease Maternal Uncle   . Cancer Maternal Grandmother        colon  . Heart disease Maternal Grandfather   . Cancer Paternal Grandmother        colon    Past Surgical History:  Procedure Laterality Date  . ABDOMINAL HYSTERECTOMY  3/98  . CHOLECYSTECTOMY  02/2006  . KNEE SURGERY    . MYOMECTOMY  12/93   uterine fibroids  . TMJ ARTHROPLASTY  8/82   Social History   Occupational History  . Occupation: Pharmacist, hospital  Tobacco Use  . Smoking status: Never  Smoker  . Smokeless tobacco: Never Used  Substance and Sexual Activity  . Alcohol use: No  . Drug use: No  . Sexual activity: Not on file

## 2021-03-25 ENCOUNTER — Encounter: Payer: Self-pay | Admitting: Orthopedic Surgery

## 2021-04-08 ENCOUNTER — Encounter (INDEPENDENT_AMBULATORY_CARE_PROVIDER_SITE_OTHER): Payer: Self-pay | Admitting: Family Medicine

## 2021-04-08 ENCOUNTER — Other Ambulatory Visit: Payer: Self-pay

## 2021-04-08 ENCOUNTER — Ambulatory Visit (INDEPENDENT_AMBULATORY_CARE_PROVIDER_SITE_OTHER): Payer: BC Managed Care – PPO | Admitting: Family Medicine

## 2021-04-08 VITALS — BP 121/72 | HR 72 | Temp 98.3°F | Ht 63.0 in | Wt 225.0 lb

## 2021-04-08 DIAGNOSIS — E559 Vitamin D deficiency, unspecified: Secondary | ICD-10-CM

## 2021-04-08 DIAGNOSIS — E7849 Other hyperlipidemia: Secondary | ICD-10-CM | POA: Diagnosis not present

## 2021-04-08 DIAGNOSIS — Z6839 Body mass index (BMI) 39.0-39.9, adult: Secondary | ICD-10-CM

## 2021-04-09 NOTE — Progress Notes (Signed)
Chief Complaint:   OBESITY Cynthia Short is here to discuss her progress with her obesity treatment plan along with follow-up of her obesity related diagnoses. Cynthia Short is on keeping a food journal and adhering to recommended goals of 1200-1300 calories and 75+ grams of protein daily and states she is following her eating plan approximately 0% of the time. Cynthia Short states she is walking for 45 minutes 2 times per week.  Today's visit was #: 65 Starting weight: 225 lbs Starting date: 08/26/2018 Today's weight: 225 lbs Today's date: 04/08/2021 Total lbs lost to date: 0 Total lbs lost since last in-office visit: 0  Interim History: Cynthia Short has had a lot of stress with multiple family members with recent cancer diagnosis. She has been feeling overwhelmed and she hasn't concentrated on her nutrition as much. She is now ready to get back on track.  Subjective:   1. Other hyperlipidemia Cynthia Short has developed myalgias and her primary care physician stopped her statin for 2 weeks to see how she does.  Assessment/Plan:   1. Other hyperlipidemia Cardiovascular risk and specific lipid/LDL goals reviewed. We discussed several lifestyle modifications today. Cynthia Short will get back to weight loss and her cholesterol lowering diet, and will continue to monitor. Orders and follow up as documented in patient record.   2. Class 2 severe obesity with serious comorbidity and body mass index (BMI) of 39.0 to 39.9 in adult, unspecified obesity type (HCC) Cynthia Short is currently in the action stage of change. As such, her goal is to continue with weight loss efforts. She has agreed to the Category 2 Plan or keeping a food journal and adhering to recommended goals of 1200-1300 calories and 75+ grams of protein daily.   Exercise goals: As is.  Behavioral modification strategies: increasing lean protein intake and decreasing simple carbohydrates.  Cynthia Short has agreed to follow-up with our clinic in 3 to 4 weeks. She was informed of  the importance of frequent follow-up visits to maximize her success with intensive lifestyle modifications for her multiple health conditions.   Objective:   Blood pressure 121/72, pulse 72, temperature 98.3 F (36.8 C), height 5\' 3"  (1.6 m), weight 225 lb (102.1 kg), SpO2 97 %. Body mass index is 39.86 kg/m.  General: Cooperative, alert, well developed, in no acute distress. HEENT: Conjunctivae and lids unremarkable. Cardiovascular: Regular rhythm.  Lungs: Normal work of breathing. Neurologic: No focal deficits.   Lab Results  Component Value Date   CREATININE 0.7 12/04/2020   BUN 10 12/04/2020   NA 138 12/04/2020   K 4.1 12/04/2020   CL 101 06/08/2020   CO2 27 (A) 12/04/2020   Lab Results  Component Value Date   ALT 17 12/04/2020   AST 15 12/04/2020   ALKPHOS 88 03/15/2020   BILITOT <0.2 03/15/2020   Lab Results  Component Value Date   HGBA1C 7.0 12/04/2020   HGBA1C 6.7 06/08/2020   HGBA1C 6.5 (H) 03/15/2020   HGBA1C 6.2 (H) 10/31/2019   HGBA1C 6.4 (H) 02/15/2019   Lab Results  Component Value Date   INSULIN 24.6 03/15/2020   INSULIN 20.8 10/31/2019   INSULIN 18.5 02/15/2019   INSULIN 24.9 11/23/2018   INSULIN 14.9 08/26/2018   Lab Results  Component Value Date   TSH 1.17 12/04/2020   Lab Results  Component Value Date   CHOL 116 12/04/2020   HDL 35 12/04/2020   LDLCALC 61 12/04/2020   TRIG 103 12/04/2020   Lab Results  Component Value Date   WBC  7.1 03/15/2020   HGB 12.2 03/15/2020   HCT 38.0 03/15/2020   MCV 89 03/15/2020   PLT 253 03/15/2020   No results found for: IRON, TIBC, FERRITIN  Attestation Statements:   Reviewed by clinician on day of visit: allergies, medications, problem list, medical history, surgical history, family history, social history, and previous encounter notes.  Time spent on visit including pre-visit chart review and post-visit care and charting was 35 minutes.    I, Trixie Dredge, am acting as transcriptionist for  Dennard Nip, MD.  I have reviewed the above documentation for accuracy and completeness, and I agree with the above. -  Dennard Nip, MD

## 2021-04-15 ENCOUNTER — Other Ambulatory Visit: Payer: Self-pay | Admitting: Obstetrics & Gynecology

## 2021-04-15 DIAGNOSIS — Z1231 Encounter for screening mammogram for malignant neoplasm of breast: Secondary | ICD-10-CM

## 2021-04-16 ENCOUNTER — Other Ambulatory Visit: Payer: Self-pay | Admitting: Obstetrics & Gynecology

## 2021-04-16 DIAGNOSIS — N644 Mastodynia: Secondary | ICD-10-CM

## 2021-04-17 ENCOUNTER — Other Ambulatory Visit: Payer: Self-pay

## 2021-04-17 ENCOUNTER — Ambulatory Visit: Payer: BC Managed Care – PPO | Admitting: Orthopedic Surgery

## 2021-04-17 DIAGNOSIS — M79641 Pain in right hand: Secondary | ICD-10-CM | POA: Diagnosis not present

## 2021-04-20 ENCOUNTER — Encounter: Payer: Self-pay | Admitting: Orthopedic Surgery

## 2021-04-20 NOTE — Progress Notes (Signed)
Office Visit Note   Patient: Cynthia Short           Date of Birth: 15-Dec-1956           MRN: [id number] Visit Date: 04/17/2021 Requested by: Gaynelle Arabian, MD [address] [place],  [place] [address] PCP: Gaynelle Arabian, MD  Subjective: Chief Complaint  Patient presents with  . Left Knee - Follow-up  . Right Hand - Follow-up    HPI: Cynthia Short is a patient with left knee pain.  The left knee is doing much better.  Not using Mobic too much because of stomach issues.  She is using Tylenol and a topical.  Since we have last seen her she is retired from [employer].  She is spending a lot of time with her grandchildren.  She also reports bilateral hand pain worse on the right-hand side.  Describes some episodic numbness and tingling in the fingertips.  Also reports some very mild neck pain and radicular arm pain.  Index fingers been stiff for the last 6 weeks.  No real triggering but she does report stiffness in the MCP and PIP joint.  Numbness and tingling to start in about 10 days ago.  Patient does have diabetes but no neuropathy at other places.  Has not really affected use of her hand yet.  She does have a splint which she can use at home.              ROS: All systems reviewed are negative as they relate to the chief complaint within the history of present illness.  Patient denies  fevers or chills.   Assessment & Plan: Visit Diagnoses:  1. Pain in right hand     Plan: Impression is right hand pain with some arthritis affecting that index finger giving her some diminished motion.  No real triggering but there is some mild tenderness over the A1 pulley.  At this time I would just encourage her to use the hand is much as possible to keep as much motion possible in the digits.  I think she is getting some early synovitis and arthritis in the hand.  Not too much to do about that other than maintain motion.  Follow-up as needed.  I do think she should use the splint at night  in case she is getting a little bit of early carpal tunnel.  Could consider nerve conduction studies if the numbness component of this worsens over the next 4 weeks.  Follow-Up Instructions: Return if symptoms worsen or fail to improve.   Orders:  No orders of the defined types were placed in this encounter.  No orders of the defined types were placed in this encounter.     Procedures: No procedures performed   Clinical Data: No additional findings.  Objective: Vital Signs: There were no vitals taken for this visit.  Physical Exam:   Constitutional: Patient appears well-developed HEENT:  Head: Normocephalic Eyes:EOM are normal Neck: Normal range of motion Cardiovascular: Normal rate Pulmonary/chest: Effort normal Neurologic: Patient is alert Skin: Skin is warm Psychiatric: Patient has normal mood and affect    Ortho Exam: Ortho exam demonstrates full active and passive range of motion of the wrist and elbow on the right-hand side with equivocal carpal tunnel compression testing and no subluxation of the ulnar nerve at the elbow.  Radial pulses intact bilaterally.  No masses lymphadenopathy or skin changes noted in the hand region.  She does have diminished flexion at the  PIP and MCP joint right index versus left.  Mild swelling is present.  Extensor mechanism is intact in all fingers and thumb.  Right knee has no effusion full range of motion stable collateral cruciate ligaments intact extensor mechanism.  Specialty Comments:  No specialty comments available.  Imaging: No results found.   PMFS History: Patient Active Problem List   Diagnosis Date Noted  . Dyspnea on exertion 07/15/2012  . Morbid obesity ([place]) 07/15/2012  . GERD (gastroesophageal reflux disease) 07/15/2012  . Diabetes mellitus type 2 in obese ([place]) 07/15/2012  . Hypertension 07/15/2012   Past Medical History:  Diagnosis Date  . Anemia   . Anxiety   . Arthritis   . Asthma   . Chest pain   .  Constipation   . Diabetes mellitus   . Dyspnea   . Gallbladder problem   . GERD (gastroesophageal reflux disease)   . Hair loss   . Heart murmur   . Hiatal hernia   . Hip problem   . Hyperlipemia   . Hypertension   . IBS (irritable bowel syndrome)   . Joint pain   . Knee problem   . Leg edema   . Thyroid disease    hypothyroidism  . Tricuspid valve disorder     Family History  Problem Relation Age of Onset  . Cancer Father        lung  . Diabetes Father   . Heart disease Father   . Stroke Father   . Kidney disease Father   . Heart disease Maternal Uncle   . Cancer Maternal Grandmother        colon  . Heart disease Maternal Grandfather   . Cancer Paternal Grandmother        colon    Past Surgical History:  Procedure Laterality Date  . ABDOMINAL HYSTERECTOMY  3/98  . CHOLECYSTECTOMY  02/2006  . KNEE SURGERY    . MYOMECTOMY  12/93   uterine fibroids  . TMJ ARTHROPLASTY  8/82   Social History   Occupational History  . Occupation: Pharmacist, hospital  Tobacco Use  . Smoking status: Never Smoker  . Smokeless tobacco: Never Used  Substance and Sexual Activity  . Alcohol use: No  . Drug use: No  . Sexual activity: Not on file

## 2021-04-21 ENCOUNTER — Other Ambulatory Visit: Payer: Self-pay | Admitting: Surgical

## 2021-04-22 NOTE — Telephone Encounter (Signed)
Pls advise.  

## 2021-05-07 ENCOUNTER — Other Ambulatory Visit: Payer: Self-pay

## 2021-05-07 ENCOUNTER — Encounter (INDEPENDENT_AMBULATORY_CARE_PROVIDER_SITE_OTHER): Payer: Self-pay | Admitting: Family Medicine

## 2021-05-07 ENCOUNTER — Ambulatory Visit (INDEPENDENT_AMBULATORY_CARE_PROVIDER_SITE_OTHER): Payer: BC Managed Care – PPO | Admitting: Family Medicine

## 2021-05-07 VITALS — BP 134/74 | HR 68 | Temp 98.2°F | Ht 63.0 in | Wt 224.0 lb

## 2021-05-07 DIAGNOSIS — E7849 Other hyperlipidemia: Secondary | ICD-10-CM

## 2021-05-07 DIAGNOSIS — E559 Vitamin D deficiency, unspecified: Secondary | ICD-10-CM | POA: Diagnosis not present

## 2021-05-07 DIAGNOSIS — E1169 Type 2 diabetes mellitus with other specified complication: Secondary | ICD-10-CM

## 2021-05-07 DIAGNOSIS — E038 Other specified hypothyroidism: Secondary | ICD-10-CM

## 2021-05-07 DIAGNOSIS — Z9189 Other specified personal risk factors, not elsewhere classified: Secondary | ICD-10-CM

## 2021-05-07 DIAGNOSIS — Z6839 Body mass index (BMI) 39.0-39.9, adult: Secondary | ICD-10-CM

## 2021-05-08 LAB — VITAMIN D 25 HYDROXY (VIT D DEFICIENCY, FRACTURES): Vit D, 25-Hydroxy: 42.4 ng/mL (ref 30.0–100.0)

## 2021-05-08 LAB — CMP14+EGFR
ALT: 17 IU/L (ref 0–32)
AST: 15 IU/L (ref 0–40)
Albumin/Globulin Ratio: 1.3 (ref 1.2–2.2)
Albumin: 3.9 g/dL (ref 3.8–4.8)
Alkaline Phosphatase: 90 IU/L (ref 44–121)
BUN/Creatinine Ratio: 14 (ref 12–28)
BUN: 9 mg/dL (ref 8–27)
Bilirubin Total: 0.2 mg/dL (ref 0.0–1.2)
CO2: 24 mmol/L (ref 20–29)
Calcium: 9.2 mg/dL (ref 8.7–10.3)
Chloride: 102 mmol/L (ref 96–106)
Creatinine, Ser: 0.66 mg/dL (ref 0.57–1.00)
Globulin, Total: 3 g/dL (ref 1.5–4.5)
Glucose: 130 mg/dL — ABNORMAL HIGH (ref 65–99)
Potassium: 4.3 mmol/L (ref 3.5–5.2)
Sodium: 142 mmol/L (ref 134–144)
Total Protein: 6.9 g/dL (ref 6.0–8.5)
eGFR: 98 mL/min/{1.73_m2} (ref >59)

## 2021-05-08 LAB — LIPID PANEL WITH LDL/HDL RATIO
Cholesterol, Total: 160 mg/dL (ref 100–199)
HDL: 38 mg/dL — ABNORMAL LOW (ref >39)
LDL Chol Calc (NIH): 99 mg/dL (ref 0–99)
LDL/HDL Ratio: 2.6 ratio (ref 0.0–3.2)
Triglycerides: 129 mg/dL (ref 0–149)
VLDL Cholesterol Cal: 23 mg/dL (ref 5–40)

## 2021-05-08 LAB — HEMOGLOBIN A1C
Est. average glucose Bld gHb Est-mCnc: 146 mg/dL
Hgb A1c MFr Bld: 6.7 % — ABNORMAL HIGH (ref 4.8–5.6)

## 2021-05-08 LAB — MICROALBUMIN / CREATININE URINE RATIO
Creatinine, Urine: 136 mg/dL
Microalb/Creat Ratio: 31 mg/g creat — ABNORMAL HIGH (ref 0–29)
Microalbumin, Urine: 41.8 ug/mL

## 2021-05-08 LAB — TSH: TSH: 1.32 u[IU]/mL (ref 0.450–4.500)

## 2021-05-08 LAB — T4, FREE: Free T4: 1.39 ng/dL (ref 0.82–1.77)

## 2021-05-08 LAB — T3: T3, Total: 133 ng/dL (ref 71–180)

## 2021-05-08 LAB — INSULIN, RANDOM: INSULIN: 37.5 u[IU]/mL — ABNORMAL HIGH (ref 2.6–24.9)

## 2021-05-08 NOTE — Progress Notes (Signed)
Chief Complaint:   OBESITY Cynthia Short is here to discuss her progress with her obesity treatment plan along with follow-up of her obesity related diagnoses. Cynthia Short is on the Category 2 Plan or keeping a food journal and adhering to recommended goals of 1200-1300 calories and 75+ grams of protein and states she is following her eating plan approximately 70% of the time. Cynthia Short states she is walking for 20 minutes 3-4 times per week.  Today's visit was #: 23 Starting weight: 225 lbs Starting date: 08/26/2018 Today's weight: 224 lbs Today's date: 05/07/2021 Total lbs lost to date: 1 Total lbs lost since last in-office visit: 1  Interim History: Cynthia Short has been doing better with her weight loss efforts in the last 1-2 weeks. She goes in between journaling and Category 2 and she feels this is a good strategy for her. She has some celebrations coming up this Summer, but she feels she will be able to manage her eating reasonably well.  Subjective:   1. Other hyperlipidemia Cynthia Short is not tolerating simvastatin and she feels better now that her primary care physician discontinued this. She continues to decrease cholesterol in her diet.  2. Type 2 diabetes mellitus with other specified complication, without long-term current use of insulin (HCC) Cynthia Short is due for labs, and she is stable on metformin and working on diet. She denies hypoglycemia.  3. Vitamin D deficiency Cynthia Short is stable on Vit D, and she is due for labs. Last Vit D level was not yet at goal.  4. Other specified hypothyroidism Cynthia Short is stable on Synthroid, and she denies palpitations or tremors. She is due for labs.  5. At risk for heart disease Cynthia Short is at a higher than average risk for cardiovascular disease due to obesity.   Assessment/Plan:   1. Other hyperlipidemia Cardiovascular risk and specific lipid/LDL goals reviewed. We discussed several lifestyle modifications today. We will check labs today. Cynthia Short will continue to work  on diet, exercise and weight loss efforts. Orders and follow up as documented in patient record.   - Lipid Panel With LDL/HDL Ratio  2. Type 2 diabetes mellitus with other specified complication, without long-term current use of insulin (HCC) We will check labs today, and Cynthia Short will continue to follow up as directed. Good blood sugar control is important to decrease the likelihood of diabetic complications such as nephropathy, neuropathy, limb loss, blindness, coronary artery disease, and death. Intensive lifestyle modification including diet, exercise and weight loss are the first line of treatment for diabetes.   - CMP14+EGFR - Insulin, random - Hemoglobin A1c - Microalbumin / creatinine urine ratio  3. Vitamin D deficiency Low Vitamin D level contributes to fatigue and are associated with obesity, breast, and colon cancer. We will check labs today, and Cynthia Short will follow-up for routine testing of Vitamin D, at least 2-3 times per year to avoid over-replacement.  - VITAMIN D 25 Hydroxy (Vit-D Deficiency, Fractures)  4. Other specified hypothyroidism We will check labs today, and Cynthia Short will continue to follow up as directed. Orders and follow up as documented in patient record.  - TSH - T4, free - T3 - Microalbumin / creatinine urine ratio  5. At risk for heart disease Cynthia Short was given approximately 15 minutes of coronary artery disease prevention counseling today. She is 65 y.o. female and has risk factors for heart disease including obesity. We discussed intensive lifestyle modifications today with an emphasis on specific weight loss instructions and strategies.   Repetitive spaced learning  was employed today to elicit superior memory formation and behavioral change.  6. Obesity with current BMI 39.7 Cynthia Short is currently in the action stage of change. As such, her goal is to continue with weight loss efforts. She has agreed to the Category 2 Plan or keeping a food journal and adhering  to recommended goals of 1200-1300 calories and 75+ grams of protein daily.   Exercise goals: As is.  Behavioral modification strategies: increasing lean protein intake and celebration eating strategies.  Cynthia Short has agreed to follow-up with our clinic in 4 weeks. She was informed of the importance of frequent follow-up visits to maximize her success with intensive lifestyle modifications for her multiple health conditions.   Cynthia Short was informed we would discuss her lab results at her next visit unless there is a critical issue that needs to be addressed sooner. Cynthia Short agreed to keep her next visit at the agreed upon time to discuss these results.  Objective:   Blood pressure 134/74, pulse 68, temperature 98.2 F (36.8 C), height _0  (1.6 m), weight 224 lb (101.6 kg), SpO2 99 %. Body mass index is 39.68 kg/m.  General: Cooperative, alert, well developed, in no acute distress. HEENT: Conjunctivae and lids unremarkable. Cardiovascular: Regular rhythm.  Lungs: Normal work of breathing. Neurologic: No focal deficits.   Lab Results  Component Value Date   CREATININE 0.66 05/07/2021   BUN 9 05/07/2021   NA 142 05/07/2021   K 4.3 05/07/2021   CL 102 05/07/2021   CO2 24 05/07/2021   Lab Results  Component Value Date   ALT 17 05/07/2021   AST 15 05/07/2021   ALKPHOS 90 05/07/2021   BILITOT 0.2 05/07/2021   Lab Results  Component Value Date   HGBA1C 6.7 (H) 05/07/2021   HGBA1C 7.0 12/04/2020   HGBA1C 6.7 06/08/2020   HGBA1C 6.5 (H) 03/15/2020   HGBA1C 6.2 (H) 10/31/2019   Lab Results  Component Value Date   INSULIN 37.5 (H) 05/07/2021   INSULIN 24.6 03/15/2020   INSULIN 20.8 10/31/2019   INSULIN 18.5 02/15/2019   INSULIN 24.9 11/23/2018   Lab Results  Component Value Date   TSH 1.320 05/07/2021   Lab Results  Component Value Date   CHOL 160 05/07/2021   HDL 38 (L) 05/07/2021   LDLCALC 99 05/07/2021   TRIG 129 05/07/2021   Lab Results  Component Value Date   WBC  7.1 03/15/2020   HGB 12.2 03/15/2020   HCT 38.0 03/15/2020   MCV 89 03/15/2020   PLT 253 03/15/2020   No results found for: IRON, TIBC, FERRITIN  Attestation Statements:   Reviewed by clinician on day of visit: allergies, medications, problem list, medical history, surgical history, family history, social history, and previous encounter notes.   I, Trixie Dredge, am acting as transcriptionist for Dennard Nip, MD.  I have reviewed the above documentation for accuracy and completeness, and I agree with the above. -  Dennard Nip, MD

## 2021-05-20 ENCOUNTER — Other Ambulatory Visit: Payer: BC Managed Care – PPO

## 2021-06-02 ENCOUNTER — Other Ambulatory Visit: Payer: Self-pay | Admitting: Surgical

## 2021-06-08 ENCOUNTER — Other Ambulatory Visit (INDEPENDENT_AMBULATORY_CARE_PROVIDER_SITE_OTHER): Payer: Self-pay | Admitting: Family Medicine

## 2021-06-08 DIAGNOSIS — E538 Deficiency of other specified B group vitamins: Secondary | ICD-10-CM

## 2021-06-10 ENCOUNTER — Ambulatory Visit (INDEPENDENT_AMBULATORY_CARE_PROVIDER_SITE_OTHER): Payer: BC Managed Care – PPO | Admitting: Family Medicine

## 2021-06-10 NOTE — Telephone Encounter (Signed)
Last OV with Dr. Beasley 

## 2021-06-11 ENCOUNTER — Ambulatory Visit: Payer: BC Managed Care – PPO

## 2021-06-12 NOTE — Telephone Encounter (Signed)
Patient is requesting a refill of the following medications: Requested Prescriptions   Pending Prescriptions Disp Refills   [facility] VITAMIN B12 1000 MCG tablet [Pharmacy Med Name: [facility] B-12 1,000 MCG TABLET] 100 tablet 1    Sig: TAKE 1 TABLET BY MOUTH EVERY DAY    Last office visit: 05/07/21 Date of last refill: 01/15/21 Last refill amount: 30 Follow up time period per chart: 4 week Next appt:08/05/21

## 2021-06-21 ENCOUNTER — Other Ambulatory Visit: Payer: BC Managed Care – PPO

## 2021-07-05 ENCOUNTER — Ambulatory Visit
Admission: RE | Admit: 2021-07-05 | Discharge: 2021-07-05 | Disposition: A | Discharge status: Home / Self Care | Payer: BC Managed Care – PPO | Source: Ambulatory Visit | Attending: Obstetrics & Gynecology | Admitting: Obstetrics & Gynecology

## 2021-07-05 ENCOUNTER — Other Ambulatory Visit: Payer: Self-pay

## 2021-07-05 DIAGNOSIS — N644 Mastodynia: Secondary | ICD-10-CM

## 2021-08-05 ENCOUNTER — Ambulatory Visit (INDEPENDENT_AMBULATORY_CARE_PROVIDER_SITE_OTHER): Payer: BC Managed Care – PPO | Admitting: Family Medicine

## 2021-10-17 DIAGNOSIS — E039 Hypothyroidism, unspecified: Secondary | ICD-10-CM | POA: Insufficient documentation

## 2021-10-17 DIAGNOSIS — R011 Cardiac murmur, unspecified: Secondary | ICD-10-CM | POA: Insufficient documentation

## 2021-10-17 DIAGNOSIS — J45909 Unspecified asthma, uncomplicated: Secondary | ICD-10-CM | POA: Insufficient documentation

## 2021-10-17 DIAGNOSIS — K449 Diaphragmatic hernia without obstruction or gangrene: Secondary | ICD-10-CM | POA: Insufficient documentation

## 2021-11-11 ENCOUNTER — Encounter (INDEPENDENT_AMBULATORY_CARE_PROVIDER_SITE_OTHER): Payer: Self-pay

## 2021-11-18 DIAGNOSIS — Z0289 Encounter for other administrative examinations: Secondary | ICD-10-CM

## 2021-11-26 ENCOUNTER — Other Ambulatory Visit: Payer: Self-pay

## 2021-11-26 ENCOUNTER — Encounter (INDEPENDENT_AMBULATORY_CARE_PROVIDER_SITE_OTHER): Payer: Self-pay | Admitting: Family Medicine

## 2021-11-26 ENCOUNTER — Ambulatory Visit (INDEPENDENT_AMBULATORY_CARE_PROVIDER_SITE_OTHER): Payer: Medicare PPO | Admitting: Family Medicine

## 2021-11-26 VITALS — BP 127/76 | HR 66 | Temp 97.9°F | Ht 63.0 in | Wt 225.0 lb

## 2021-11-26 DIAGNOSIS — E559 Vitamin D deficiency, unspecified: Secondary | ICD-10-CM

## 2021-11-26 DIAGNOSIS — R5383 Other fatigue: Secondary | ICD-10-CM

## 2021-11-26 DIAGNOSIS — Z794 Long term (current) use of insulin: Secondary | ICD-10-CM

## 2021-11-26 DIAGNOSIS — Z1331 Encounter for screening for depression: Secondary | ICD-10-CM | POA: Diagnosis not present

## 2021-11-26 DIAGNOSIS — E1169 Type 2 diabetes mellitus with other specified complication: Secondary | ICD-10-CM | POA: Diagnosis not present

## 2021-11-26 DIAGNOSIS — E038 Other specified hypothyroidism: Secondary | ICD-10-CM

## 2021-11-26 DIAGNOSIS — E785 Hyperlipidemia, unspecified: Secondary | ICD-10-CM

## 2021-11-26 DIAGNOSIS — Z6839 Body mass index (BMI) 39.0-39.9, adult: Secondary | ICD-10-CM

## 2021-11-26 DIAGNOSIS — R0602 Shortness of breath: Secondary | ICD-10-CM | POA: Diagnosis not present

## 2021-11-26 DIAGNOSIS — E538 Deficiency of other specified B group vitamins: Secondary | ICD-10-CM

## 2021-11-26 NOTE — Progress Notes (Signed)
Chief Complaint:   OBESITY Cynthia Short (MR# [id number]) is a 65 y.o. female who presents for evaluation and treatment of obesity and related comorbidities. Current BMI is Body mass index is 39.86 kg/m. Cynthia Short has been struggling with her weight for many years and has been unsuccessful in either losing weight, maintaining weight loss, or reaching her healthy weight goal.  Cynthia Short is a returning patient after approximately 6 months away. She has had a lot of family stressors and family health issues, which have prevented her from concentrating on weight loss. She is ready to get back on track now.  Cynthia Short is currently in the action stage of change and ready to dedicate time achieving and maintaining a healthier weight. Cynthia Short is interested in becoming our patient and working on intensive lifestyle modifications including (but not limited to) diet and exercise for weight loss.  Cynthia Short's habits were reviewed today and are as follows: Her family eats meals together, she thinks her family will eat healthier with her, her desired weight loss is 80 lbs, she has been heavy most of her life, she started gaining weight in college, her heaviest weight ever was 265 pounds, she has significant food cravings issues, she snacks frequently in the evenings, she skips meals frequently, she is frequently drinking liquids with calories, she frequently makes poor food choices, she frequently eats larger portions than normal, and she struggles with emotional eating.  Depression Screen Cynthia Short's Food and Mood (modified PHQ-9) score was 10.  Depression screen PHQ 2/9 11/26/2021  Decreased Interest 2  Down, Depressed, Hopeless 2  PHQ - 2 Score 4  Altered sleeping 1  Tired, decreased energy 2  Change in appetite 0  Feeling bad or failure about yourself  1  Trouble concentrating 2  Moving slowly or fidgety/restless 0  Suicidal thoughts 0  PHQ-9 Score 10  Difficult doing work/chores Not difficult at all    Subjective:   1. Other fatigue Cynthia Short admits to daytime somnolence and admits to waking up still tired. Patent has a history of symptoms of daytime fatigue, morning fatigue, and morning headache. Cynthia Short generally gets 5 or 7 hours of sleep per night, and states that she has nightime awakenings and generally restful sleep. Snoring is present. Apneic episodes are not present. Epworth Sleepiness Score is 4.  2. SOB (shortness of breath) on exertion Cynthia Short notes increasing shortness of breath with exercising and seems to be worsening over time with weight gain. She notes getting out of breath sooner with activity than she used to. This has not gotten worse recently. Cynthia Short denies shortness of breath at rest or orthopnea.  3. Type 2 diabetes mellitus with other specified complication, with long-term current use of insulin (HCC) Cynthia Short is on metformin and she is ready to work on diet and weight loss again. She is due to have labs.  4. Vitamin D deficiency Cynthia Short is on Vit D, and she still notes fatigue and shortness of breath.  5. Vitamin B 12 deficiency Cynthia Short is on B12, and she is due for labs.  6. Hyperlipidemia associated with type 2 diabetes mellitus ([name]) Cynthia Short is working on diet and exercise, and she is due to have labs checked.  7. Other specified hypothyroidism Cynthia Short is on Synthroid, and she notes fatigue. She has no signs of over-replacement.  Assessment/Plan:   1. Other fatigue Cynthia Short does feel that her weight is causing her energy to be lower than it should be. Fatigue may be related to obesity,  depression or many other causes. Labs will be ordered, and in the meanwhile, Cynthia Short will focus on self care including making healthy food choices, increasing physical activity and focusing on stress reduction.  2. SOB (shortness of breath) on exertion Cynthia Short does feel that she gets out of breath more easily that she used to when she exercises. Cynthia Short's shortness of breath appears to be obesity  related and exercise induced. She has agreed to work on weight loss and gradually increase exercise to treat her exercise induced shortness of breath. Will continue to monitor closely.  3. Type 2 diabetes mellitus with other specified complication, with long-term current use of insulin (HCC) We will check labs today, and will follow up at Cynthia Short's next visit. Good blood sugar control is important to decrease the likelihood of diabetic complications such as nephropathy, neuropathy, limb loss, blindness, coronary artery disease, and death. Intensive lifestyle modification including diet, exercise and weight loss are the first line of treatment for diabetes.   - CBC with Differential/Platelet - Comprehensive metabolic panel - Hemoglobin A1c  4. Vitamin D deficiency Low Vitamin D level contributes to fatigue and are associated with obesity, breast, and colon cancer. We will check labs today. Cynthia Short will follow-up for routine testing of Vitamin D, at least 2-3 times per year to avoid over-replacement.  - VITAMIN D 25 Hydroxy (Vit-D Deficiency, Fractures)  5. Vitamin B 12 deficiency The diagnosis was reviewed with the patient. We will check labs today, and will follow up at Cynthia Short's next visit. Orders and follow up as documented in patient record.  - Vitamin B12 - Folate  6. Hyperlipidemia associated with type 2 diabetes mellitus ([name]) Cardiovascular risk and specific lipid/LDL goals reviewed. We discussed several lifestyle modifications today. We will check labs today. Symphony will work on diet, exercise and weight loss efforts. Orders and follow up as documented in patient record.   - Lipid panel  7. Other specified hypothyroidism We will check labs today, and will follow up at Cynthia Short's next visit. Orders and follow up as documented in patient record.  - T3, free - T4, free - TSH  8. Screening for depression [place] had a positive depression screening. Depression is commonly associated with  obesity and often results in emotional eating behaviors. We will monitor this closely and work on CBT to help improve the non-hunger eating patterns. Referral to Psychology may be required if no improvement is seen as she continues in our clinic.  9. Obesity with current BMI 40.0 Deedee is currently in the action stage of change and her goal is to continue with weight loss efforts. I recommend Ashtan begin the structured treatment plan as follows:  She has agreed to the Category 2 Plan + 100 calories.  Exercise goals: No exercise has been prescribed for now, while we concentrate on nutritional changes.  Behavioral modification strategies: increasing lean protein intake, decreasing simple carbohydrates, and holiday eating strategies .  She was informed of the importance of frequent follow-up visits to maximize her success with intensive lifestyle modifications for her multiple health conditions. She was informed we would discuss her lab results at her next visit unless there is a critical issue that needs to be addressed sooner. Francy agreed to keep her next visit at the agreed upon time to discuss these results.  Objective:   Blood pressure 127/76, pulse 66, temperature 97.9 F (36.6 C), height 5\' 3"  (1.6 m), weight 225 lb (102.1 kg), SpO2 98 %. Body mass index is 39.86 kg/m.  EKG: Normal sinus rhythm, rate (Unable to obtain).  Indirect Calorimeter completed today shows a VO2 of 281 and a REE of 1944.  Her calculated basal metabolic rate is 9480 thus her basal metabolic rate is better than expected.  General: Cooperative, alert, well developed, in no acute distress. HEENT: Conjunctivae and lids unremarkable. Cardiovascular: Regular rhythm.  Lungs: Normal work of breathing. Neurologic: No focal deficits.   Lab Results  Component Value Date   CREATININE 0.66 05/07/2021   BUN 9 05/07/2021   NA 142 05/07/2021   K 4.3 05/07/2021   CL 102 05/07/2021   CO2 24 05/07/2021   Lab Results   Component Value Date   ALT 17 05/07/2021   AST 15 05/07/2021   ALKPHOS 90 05/07/2021   BILITOT 0.2 05/07/2021   Lab Results  Component Value Date   HGBA1C 6.7 (H) 05/07/2021   HGBA1C 7.0 12/04/2020   HGBA1C 6.7 06/08/2020   HGBA1C 6.5 (H) 03/15/2020   HGBA1C 6.2 (H) 10/31/2019   Lab Results  Component Value Date   INSULIN 37.5 (H) 05/07/2021   INSULIN 24.6 03/15/2020   INSULIN 20.8 10/31/2019   INSULIN 18.5 02/15/2019   INSULIN 24.9 11/23/2018   Lab Results  Component Value Date   TSH 1.320 05/07/2021   Lab Results  Component Value Date   CHOL 160 05/07/2021   HDL 38 (L) 05/07/2021   LDLCALC 99 05/07/2021   TRIG 129 05/07/2021   Lab Results  Component Value Date   WBC 7.1 03/15/2020   HGB 12.2 03/15/2020   HCT 38.0 03/15/2020   MCV 89 03/15/2020   PLT 253 03/15/2020   No results found for: IRON, TIBC, FERRITIN  Attestation Statements:   Reviewed by clinician on day of visit: allergies, medications, problem list, medical history, surgical history, family history, social history, and previous encounter notes.  Time spent on visit including pre-visit chart review and post-visit charting and care was 43 minutes.    I, Trixie Dredge, am acting as transcriptionist for Dennard Nip, MD.  I have reviewed the above documentation for accuracy and completeness, and I agree with the above. - Dennard Nip, MD

## 2021-11-27 LAB — LIPID PANEL
Chol/HDL Ratio: 3.9 ratio (ref 0.0–4.4)
Cholesterol, Total: 162 mg/dL (ref 100–199)
HDL: 42 mg/dL (ref >39)
LDL Chol Calc (NIH): 101 mg/dL — ABNORMAL HIGH (ref 0–99)
Triglycerides: 106 mg/dL (ref 0–149)
VLDL Cholesterol Cal: 19 mg/dL (ref 5–40)

## 2021-11-27 LAB — CBC WITH DIFFERENTIAL/PLATELET
Basophils Absolute: 0 10*3/uL (ref 0.0–0.2)
Basos: 0 %
EOS (ABSOLUTE): 0.3 10*3/uL (ref 0.0–0.4)
Eos: 4 %
Hematocrit: 38.1 % (ref 34.0–46.6)
Hemoglobin: 12.2 g/dL (ref 11.1–15.9)
Immature Grans (Abs): 0 10*3/uL (ref 0.0–0.1)
Immature Granulocytes: 0 %
Lymphocytes Absolute: 2.3 10*3/uL (ref 0.7–3.1)
Lymphs: 30 %
MCH: 27.6 pg (ref 26.6–33.0)
MCHC: 32 g/dL (ref 31.5–35.7)
MCV: 86 fL (ref 79–97)
Monocytes Absolute: 0.6 10*3/uL (ref 0.1–0.9)
Monocytes: 7 %
Neutrophils Absolute: 4.5 10*3/uL (ref 1.4–7.0)
Neutrophils: 59 %
Platelets: 300 10*3/uL (ref 150–450)
RBC: 4.42 x10E6/uL (ref 3.77–5.28)
RDW: 13.8 % (ref 11.7–15.4)
WBC: 7.7 10*3/uL (ref 3.4–10.8)

## 2021-11-27 LAB — COMPREHENSIVE METABOLIC PANEL
ALT: 18 IU/L (ref 0–32)
AST: 19 IU/L (ref 0–40)
Albumin/Globulin Ratio: 1.5 (ref 1.2–2.2)
Albumin: 4.4 g/dL (ref 3.8–4.8)
Alkaline Phosphatase: 98 IU/L (ref 44–121)
BUN/Creatinine Ratio: 12 (ref 12–28)
BUN: 8 mg/dL (ref 8–27)
Bilirubin Total: 0.4 mg/dL (ref 0.0–1.2)
CO2: 25 mmol/L (ref 20–29)
Calcium: 9.2 mg/dL (ref 8.7–10.3)
Chloride: 99 mmol/L (ref 96–106)
Creatinine, Ser: 0.69 mg/dL (ref 0.57–1.00)
Globulin, Total: 3 g/dL (ref 1.5–4.5)
Glucose: 98 mg/dL (ref 70–99)
Potassium: 4.3 mmol/L (ref 3.5–5.2)
Sodium: 139 mmol/L (ref 134–144)
Total Protein: 7.4 g/dL (ref 6.0–8.5)
eGFR: 96 mL/min/{1.73_m2} (ref >59)

## 2021-11-27 LAB — HEMOGLOBIN A1C
Est. average glucose Bld gHb Est-mCnc: 143 mg/dL
Hgb A1c MFr Bld: 6.6 % — ABNORMAL HIGH (ref 4.8–5.6)

## 2021-11-27 LAB — VITAMIN B12: Vitamin B-12: 666 pg/mL (ref 232–1245)

## 2021-11-27 LAB — TSH: TSH: 1.88 u[IU]/mL (ref 0.450–4.500)

## 2021-11-27 LAB — FOLATE: Folate: 14.1 ng/mL (ref >3.0)

## 2021-11-27 LAB — VITAMIN D 25 HYDROXY (VIT D DEFICIENCY, FRACTURES): Vit D, 25-Hydroxy: 51.2 ng/mL (ref 30.0–100.0)

## 2021-11-27 LAB — T4, FREE: Free T4: 1.52 ng/dL (ref 0.82–1.77)

## 2021-11-27 LAB — T3, FREE: T3, Free: 2.9 pg/mL (ref 2.0–4.4)

## 2021-12-15 DIAGNOSIS — R0981 Nasal congestion: Secondary | ICD-10-CM | POA: Diagnosis not present

## 2021-12-15 DIAGNOSIS — R509 Fever, unspecified: Secondary | ICD-10-CM | POA: Diagnosis not present

## 2021-12-15 DIAGNOSIS — R059 Cough, unspecified: Secondary | ICD-10-CM | POA: Diagnosis not present

## 2021-12-15 DIAGNOSIS — U071 COVID-19: Secondary | ICD-10-CM | POA: Diagnosis not present

## 2021-12-17 ENCOUNTER — Ambulatory Visit (INDEPENDENT_AMBULATORY_CARE_PROVIDER_SITE_OTHER): Payer: Medicare PPO | Admitting: Family Medicine

## 2021-12-31 ENCOUNTER — Other Ambulatory Visit: Payer: Self-pay

## 2021-12-31 ENCOUNTER — Ambulatory Visit (INDEPENDENT_AMBULATORY_CARE_PROVIDER_SITE_OTHER): Payer: Medicare Other | Admitting: Family Medicine

## 2021-12-31 ENCOUNTER — Encounter (INDEPENDENT_AMBULATORY_CARE_PROVIDER_SITE_OTHER): Payer: Self-pay | Admitting: Family Medicine

## 2021-12-31 VITALS — BP 128/74 | HR 75 | Temp 98.0°F | Ht 63.0 in | Wt 226.0 lb

## 2021-12-31 DIAGNOSIS — Z6841 Body Mass Index (BMI) 40.0 and over, adult: Secondary | ICD-10-CM

## 2021-12-31 DIAGNOSIS — Z794 Long term (current) use of insulin: Secondary | ICD-10-CM

## 2021-12-31 DIAGNOSIS — E1169 Type 2 diabetes mellitus with other specified complication: Secondary | ICD-10-CM

## 2021-12-31 DIAGNOSIS — E669 Obesity, unspecified: Secondary | ICD-10-CM

## 2021-12-31 NOTE — Progress Notes (Signed)
Chief Complaint:   OBESITY Cynthia Short is here to discuss her progress with her obesity treatment plan along with follow-up of her obesity related diagnoses. Cynthia Short is on the Category 2 Plan + 100 calories and states she is following her eating plan approximately 70% of the time. Cynthia Short states she is doing 0 minutes 0 times per week.  Today's visit was #: 2 Starting weight: 225 lbs Starting date: 11/26/2021 Today's weight: 226 lbs Today's date: 12/31/2021 Total lbs lost to date: 0 Total lbs lost since last in-office visit: 0  Interim History: Cynthia Short got sick soon after her last visit, and she hasn't been able to follow her plan closely. She is feeling better now, and she is ready to start her Category 2 plan again.  Subjective:   1. Type 2 diabetes mellitus with other specified complication, with long-term current use of insulin (HCC) Cynthia Short's A1c is at 6.6. She is stable on metformin, but she is open to starting a GLP-1 to help with polyphagia. She wants to get back to her eating plan first before she decides.  Assessment/Plan:   1. Type 2 diabetes mellitus with other specified complication, with long-term current use of insulin (HCC) Cynthia Short is to continue her Category 2 plan and we will follow up in 2-3 weeks and reassess. Pros and cons as well as side effects of GLP-1 were discussed at length. Good blood sugar control is important to decrease the likelihood of diabetic complications such as nephropathy, neuropathy, limb loss, blindness, coronary artery disease, and death. Intensive lifestyle modification including diet, exercise and weight loss are the first line of treatment for diabetes.   2. Obesity with current BMI 40.1 Cynthia Short is currently in the action stage of change. As such, her goal is to continue with weight loss efforts. She has agreed to the Category 2 Plan.   Behavioral modification strategies: decreasing simple carbohydrates and no skipping meals.  Cynthia Short has agreed to  follow-up with our clinic in 2 to 3 weeks. She was informed of the importance of frequent follow-up visits to maximize her success with intensive lifestyle modifications for her multiple health conditions.   Objective:   Blood pressure 128/74, pulse 75, temperature 98 F (36.7 C), temperature source Oral, height 5\' 3"  (1.6 m), weight 226 lb (102.5 kg), SpO2 98 %. Body mass index is 40.03 kg/m.  General: Cooperative, alert, well developed, in no acute distress. HEENT: Conjunctivae and lids unremarkable. Cardiovascular: Regular rhythm.  Lungs: Normal work of breathing. Neurologic: No focal deficits.   Lab Results  Component Value Date   CREATININE 0.69 11/26/2021   BUN 8 11/26/2021   NA 139 11/26/2021   K 4.3 11/26/2021   CL 99 11/26/2021   CO2 25 11/26/2021   Lab Results  Component Value Date   ALT 18 11/26/2021   AST 19 11/26/2021   ALKPHOS 98 11/26/2021   BILITOT 0.4 11/26/2021   Lab Results  Component Value Date   HGBA1C 6.6 (H) 11/26/2021   HGBA1C 6.7 (H) 05/07/2021   HGBA1C 7.0 12/04/2020   HGBA1C 6.7 06/08/2020   HGBA1C 6.5 (H) 03/15/2020   Lab Results  Component Value Date   INSULIN 37.5 (H) 05/07/2021   INSULIN 24.6 03/15/2020   INSULIN 20.8 10/31/2019   INSULIN 18.5 02/15/2019   INSULIN 24.9 11/23/2018   Lab Results  Component Value Date   TSH 1.880 11/26/2021   Lab Results  Component Value Date   CHOL 162 11/26/2021   HDL 42 11/26/2021  LDLCALC 101 (H) 11/26/2021   TRIG 106 11/26/2021   CHOLHDL 3.9 11/26/2021   Lab Results  Component Value Date   VD25OH 51.2 11/26/2021   VD25OH 42.4 05/07/2021   VD25OH 27.2 12/04/2020   Lab Results  Component Value Date   WBC 7.7 11/26/2021   HGB 12.2 11/26/2021   HCT 38.1 11/26/2021   MCV 86 11/26/2021   PLT 300 11/26/2021   No results found for: IRON, TIBC, FERRITIN  Attestation Statements:   Reviewed by clinician on day of visit: allergies, medications, problem list, medical history, surgical  history, family history, social history, and previous encounter notes.  Time spent on visit including pre-visit chart review and post-visit care and charting was 42 minutes.    I, Trixie Dredge, am acting as transcriptionist for Dennard Nip, MD.  I have reviewed the above documentation for accuracy and completeness, and I agree with the above. -  Dennard Nip, MD

## 2022-01-06 DIAGNOSIS — Z0000 Encounter for general adult medical examination without abnormal findings: Secondary | ICD-10-CM | POA: Diagnosis not present

## 2022-01-06 DIAGNOSIS — J45909 Unspecified asthma, uncomplicated: Secondary | ICD-10-CM | POA: Diagnosis not present

## 2022-01-06 DIAGNOSIS — E538 Deficiency of other specified B group vitamins: Secondary | ICD-10-CM | POA: Diagnosis not present

## 2022-01-06 DIAGNOSIS — E11319 Type 2 diabetes mellitus with unspecified diabetic retinopathy without macular edema: Secondary | ICD-10-CM | POA: Diagnosis not present

## 2022-01-06 DIAGNOSIS — E1129 Type 2 diabetes mellitus with other diabetic kidney complication: Secondary | ICD-10-CM | POA: Diagnosis not present

## 2022-01-06 DIAGNOSIS — I10 Essential (primary) hypertension: Secondary | ICD-10-CM | POA: Diagnosis not present

## 2022-01-06 DIAGNOSIS — E7800 Pure hypercholesterolemia, unspecified: Secondary | ICD-10-CM | POA: Diagnosis not present

## 2022-01-06 DIAGNOSIS — E039 Hypothyroidism, unspecified: Secondary | ICD-10-CM | POA: Diagnosis not present

## 2022-01-06 DIAGNOSIS — K219 Gastro-esophageal reflux disease without esophagitis: Secondary | ICD-10-CM | POA: Diagnosis not present

## 2022-01-06 DIAGNOSIS — E559 Vitamin D deficiency, unspecified: Secondary | ICD-10-CM | POA: Diagnosis not present

## 2022-01-06 DIAGNOSIS — G722 Myopathy due to other toxic agents: Secondary | ICD-10-CM | POA: Diagnosis not present

## 2022-01-07 ENCOUNTER — Ambulatory Visit (INDEPENDENT_AMBULATORY_CARE_PROVIDER_SITE_OTHER): Payer: Medicare Other | Admitting: Family Medicine

## 2022-01-09 DIAGNOSIS — E1129 Type 2 diabetes mellitus with other diabetic kidney complication: Secondary | ICD-10-CM | POA: Diagnosis not present

## 2022-01-09 DIAGNOSIS — Z7985 Long-term (current) use of injectable non-insulin antidiabetic drugs: Secondary | ICD-10-CM | POA: Diagnosis not present

## 2022-01-09 DIAGNOSIS — E7800 Pure hypercholesterolemia, unspecified: Secondary | ICD-10-CM | POA: Diagnosis not present

## 2022-01-11 ENCOUNTER — Other Ambulatory Visit (INDEPENDENT_AMBULATORY_CARE_PROVIDER_SITE_OTHER): Payer: Self-pay | Admitting: Family Medicine

## 2022-01-11 DIAGNOSIS — E538 Deficiency of other specified B group vitamins: Secondary | ICD-10-CM

## 2022-01-13 NOTE — Telephone Encounter (Signed)
Dr.Beasley 

## 2022-01-14 ENCOUNTER — Other Ambulatory Visit: Payer: Self-pay

## 2022-01-14 ENCOUNTER — Ambulatory Visit (INDEPENDENT_AMBULATORY_CARE_PROVIDER_SITE_OTHER): Payer: Medicare Other | Admitting: Family Medicine

## 2022-01-14 ENCOUNTER — Encounter (INDEPENDENT_AMBULATORY_CARE_PROVIDER_SITE_OTHER): Payer: Self-pay | Admitting: Family Medicine

## 2022-01-14 VITALS — BP 146/78 | HR 71 | Temp 98.4°F | Ht 63.0 in | Wt 224.0 lb

## 2022-01-14 DIAGNOSIS — E1169 Type 2 diabetes mellitus with other specified complication: Secondary | ICD-10-CM | POA: Diagnosis not present

## 2022-01-14 DIAGNOSIS — Z794 Long term (current) use of insulin: Secondary | ICD-10-CM

## 2022-01-14 DIAGNOSIS — Z7984 Long term (current) use of oral hypoglycemic drugs: Secondary | ICD-10-CM

## 2022-01-14 DIAGNOSIS — Z6839 Body mass index (BMI) 39.0-39.9, adult: Secondary | ICD-10-CM

## 2022-01-14 DIAGNOSIS — E669 Obesity, unspecified: Secondary | ICD-10-CM

## 2022-01-14 NOTE — Progress Notes (Signed)
Chief Complaint:   OBESITY Cynthia Short is here to discuss her progress with her obesity treatment plan along with follow-up of her obesity related diagnoses. [name] is on the Category 2 Plan and states she is following her eating plan approximately 70% of the time. Analyse states she is doing 0 minutes 0 times per week.  Today's visit was #: 3 Starting weight: 225 lbs Starting date: 11/26/2021 Today's weight: 224 lbs Today's date: 01/14/2022 Total lbs lost to date: 1 Total lbs lost since last in-office visit: 2  Interim History: Aleia continues to do well with weight loss. She has an upper respiratory infection and exercise has been limited. She liked to go between her Category 2 and journal plan.  Subjective:   1. Type 2 diabetes mellitus with other specified complication, with long-term current use of insulin (HCC) Cynthia Short is on metformin but her A1c at her primary care physician's office was elevated at 7.1 recently. She has multiple questions about Ozempic and it's side effects and she is really wanting to control her glucose primarily with lifestyle changes.  Assessment/Plan:   1. Type 2 diabetes mellitus with other specified complication, with long-term current use of insulin (HCC) Cynthia Short was educated on lifestyle changes as well as side effects of Ozempic. Pancreatic B-cell function was explained and she will think about this option more.   2. Obesity with current BMI 39.7 Cynthia Short is currently in the action stage of change. As such, her goal is to continue with weight loss efforts. She has agreed to the Category 2 Plan or keeping a food journal and adhering to recommended goals of 1200 calories and 75+ grams of protein daily.   Behavioral modification strategies: increasing lean protein intake.  Cynthia Short has agreed to follow-up with our clinic in 3 weeks. She was informed of the importance of frequent follow-up visits to maximize her success with intensive lifestyle modifications for her  multiple health conditions.   Objective:   Blood pressure (!) 146/78, pulse 71, temperature 98.4 F (36.9 C), height 5\' 3"  (1.6 m), weight 224 lb (101.6 kg), SpO2 98 %. Body mass index is 39.68 kg/m.  General: Cooperative, alert, well developed, in no acute distress. HEENT: Conjunctivae and lids unremarkable. Cardiovascular: Regular rhythm.  Lungs: Normal work of breathing. Neurologic: No focal deficits.   Lab Results  Component Value Date   CREATININE 0.69 11/26/2021   BUN 8 11/26/2021   NA 139 11/26/2021   K 4.3 11/26/2021   CL 99 11/26/2021   CO2 25 11/26/2021   Lab Results  Component Value Date   ALT 18 11/26/2021   AST 19 11/26/2021   ALKPHOS 98 11/26/2021   BILITOT 0.4 11/26/2021   Lab Results  Component Value Date   HGBA1C 6.6 (H) 11/26/2021   HGBA1C 6.7 (H) 05/07/2021   HGBA1C 7.0 12/04/2020   HGBA1C 6.7 06/08/2020   HGBA1C 6.5 (H) 03/15/2020   Lab Results  Component Value Date   INSULIN 37.5 (H) 05/07/2021   INSULIN 24.6 03/15/2020   INSULIN 20.8 10/31/2019   INSULIN 18.5 02/15/2019   INSULIN 24.9 11/23/2018   Lab Results  Component Value Date   TSH 1.880 11/26/2021   Lab Results  Component Value Date   CHOL 162 11/26/2021   HDL 42 11/26/2021   LDLCALC 101 (H) 11/26/2021   TRIG 106 11/26/2021   CHOLHDL 3.9 11/26/2021   Lab Results  Component Value Date   VD25OH 51.2 11/26/2021   VD25OH 42.4 05/07/2021   VD25OH  27.2 12/04/2020   Lab Results  Component Value Date   WBC 7.7 11/26/2021   HGB 12.2 11/26/2021   HCT 38.1 11/26/2021   MCV 86 11/26/2021   PLT 300 11/26/2021   No results found for: IRON, TIBC, FERRITIN  Attestation Statements:   Reviewed by clinician on day of visit: allergies, medications, problem list, medical history, surgical history, family history, social history, and previous encounter notes.  Time spent on visit including pre-visit chart review and post-visit care and charting was 32 minutes.    I, Trixie Dredge, am acting as transcriptionist for Dennard Nip, MD.  I have reviewed the above documentation for accuracy and completeness, and I agree with the above. -  Dennard Nip, MD

## 2022-01-17 ENCOUNTER — Other Ambulatory Visit: Payer: Self-pay | Admitting: Obstetrics & Gynecology

## 2022-01-17 DIAGNOSIS — Z1231 Encounter for screening mammogram for malignant neoplasm of breast: Secondary | ICD-10-CM

## 2022-02-04 ENCOUNTER — Ambulatory Visit
Admission: RE | Admit: 2022-02-04 | Discharge: 2022-02-04 | Disposition: A | Discharge status: Home / Self Care | Payer: Medicare Other | Source: Ambulatory Visit | Attending: Obstetrics & Gynecology | Admitting: Obstetrics & Gynecology

## 2022-02-04 DIAGNOSIS — Z1231 Encounter for screening mammogram for malignant neoplasm of breast: Secondary | ICD-10-CM

## 2022-02-07 DIAGNOSIS — Z7984 Long term (current) use of oral hypoglycemic drugs: Secondary | ICD-10-CM | POA: Diagnosis not present

## 2022-02-07 DIAGNOSIS — E1129 Type 2 diabetes mellitus with other diabetic kidney complication: Secondary | ICD-10-CM | POA: Diagnosis not present

## 2022-02-13 ENCOUNTER — Ambulatory Visit (INDEPENDENT_AMBULATORY_CARE_PROVIDER_SITE_OTHER): Payer: Medicare Other | Admitting: Family Medicine

## 2022-03-06 ENCOUNTER — Encounter (INDEPENDENT_AMBULATORY_CARE_PROVIDER_SITE_OTHER): Payer: Self-pay | Admitting: Family Medicine

## 2022-03-06 ENCOUNTER — Ambulatory Visit (INDEPENDENT_AMBULATORY_CARE_PROVIDER_SITE_OTHER): Payer: Medicare Other | Admitting: Family Medicine

## 2022-03-06 ENCOUNTER — Other Ambulatory Visit: Payer: Self-pay

## 2022-03-06 VITALS — BP 125/76 | HR 70 | Temp 98.1°F | Ht 63.0 in | Wt 223.0 lb

## 2022-03-06 DIAGNOSIS — E669 Obesity, unspecified: Secondary | ICD-10-CM

## 2022-03-06 DIAGNOSIS — Z6839 Body mass index (BMI) 39.0-39.9, adult: Secondary | ICD-10-CM | POA: Diagnosis not present

## 2022-03-06 DIAGNOSIS — E1169 Type 2 diabetes mellitus with other specified complication: Secondary | ICD-10-CM | POA: Diagnosis not present

## 2022-03-06 DIAGNOSIS — Z7984 Long term (current) use of oral hypoglycemic drugs: Secondary | ICD-10-CM

## 2022-03-06 MED ORDER — METFORMIN HCL ER 500 MG PO TB24: 500.0000 mg | ORAL_TABLET | Freq: Three times a day (TID) | ORAL | 0 refills | Status: DC

## 2022-03-10 NOTE — Progress Notes (Signed)
? ? ? ?Chief Complaint:  ? ?OBESITY ?Cynthia Short is here to discuss her progress with her obesity treatment plan along with follow-up of her obesity related diagnoses. Cynthia Short is on the Category 2 Plan or keeping a food journal and adhering to recommended goals of 1200 calories and 75+ grams of protein daily and states she is following her eating plan approximately 50% of the time. Cynthia Short states she is doing 0 minutes 0 times per week. ? ?Today's visit was #: 4 ?Starting weight: 225 lbs ?Starting date: 11/26/2021 ?Today's weight: 223 lbs ?Today's date: 03/06/2022 ?Total lbs lost to date: 2 ?Total lbs lost since last in-office visit: 1 ? ?Interim History: Cynthia Short continues to work on weight loss. Her knee has bothered her more which has limited her exercise. She is planning on starting back at the [facility]. ? ?Subjective:  ? ?1. Type 2 diabetes mellitus with other specified complication, with long-term current use of insulin ([place]) ?Cynthia Short's recent A1c outside of [place] was 7.1. She wants to avoid Ozempic and would like to try to increase metformin first. ? ?Assessment/Plan:  ? ?1. Type 2 diabetes mellitus with other specified complication, with long-term current use of insulin ([place]) ?Cynthia Short agreed to increase metformin XR 500 mg 3 tablets PO daily, and we will refill for 90 days. ? ?- metFORMIN (GLUCOPHAGE-XR) 500 MG 24 hr tablet; Take 1 tablet (500 mg total) by mouth 3 (three) times daily.  Dispense: 270 tablet; Refill: 0 ? ?2. Obesity with current BMI 39.6 ?Cynthia Short is currently in the action stage of change. As such, her goal is to continue with weight loss efforts. She has agreed to the Category 2 Plan.  ? ?Exercise goals: 30 minutes 2-3 times per week. ? ?Behavioral modification strategies: increasing lean protein intake. ? ?Cynthia Short has agreed to follow-up with our clinic in 4 weeks. She was informed of the importance of frequent follow-up visits to maximize her success with intensive lifestyle modifications for her multiple  health conditions.  ? ?Objective:  ? ?Blood pressure 125/76, pulse 70, temperature 98.1 ?F (36.7 ?C), height '5\' 3"'$  (1.6 m), weight 223 lb (101.2 kg), SpO2 97 %. ?Body mass index is 39.5 kg/m?. ? ?General: Cooperative, alert, well developed, in no acute distress. ?HEENT: Conjunctivae and lids unremarkable. ?Cardiovascular: Regular rhythm.  ?Lungs: Normal work of breathing. ?Neurologic: No focal deficits.  ? ?Lab Results  ?Component Value Date  ? CREATININE 0.69 11/26/2021  ? BUN 8 11/26/2021  ? NA 139 11/26/2021  ? K 4.3 11/26/2021  ? CL 99 11/26/2021  ? CO2 25 11/26/2021  ? ?Lab Results  ?Component Value Date  ? ALT 18 11/26/2021  ? AST 19 11/26/2021  ? ALKPHOS 98 11/26/2021  ? BILITOT 0.4 11/26/2021  ? ?Lab Results  ?Component Value Date  ? HGBA1C 6.6 (H) 11/26/2021  ? HGBA1C 6.7 (H) 05/07/2021  ? HGBA1C 7.0 12/04/2020  ? HGBA1C 6.7 06/08/2020  ? HGBA1C 6.5 (H) 03/15/2020  ? ?Lab Results  ?Component Value Date  ? INSULIN 37.5 (H) 05/07/2021  ? INSULIN 24.6 03/15/2020  ? INSULIN 20.8 10/31/2019  ? INSULIN 18.5 02/15/2019  ? INSULIN 24.9 11/23/2018  ? ?Lab Results  ?Component Value Date  ? TSH 1.880 11/26/2021  ? ?Lab Results  ?Component Value Date  ? CHOL 162 11/26/2021  ? HDL 42 11/26/2021  ? LDLCALC 101 (H) 11/26/2021  ? TRIG 106 11/26/2021  ? CHOLHDL 3.9 11/26/2021  ? ?Lab Results  ?Component Value Date  ? VD25OH 51.2 11/26/2021  ?  VD25OH 42.4 05/07/2021  ? VD25OH 27.2 12/04/2020  ? ?Lab Results  ?Component Value Date  ? WBC 7.7 11/26/2021  ? HGB 12.2 11/26/2021  ? HCT 38.1 11/26/2021  ? MCV 86 11/26/2021  ? PLT 300 11/26/2021  ? ?No results found for: IRON, TIBC, FERRITIN ? ?Attestation Statements:  ? ?Reviewed by clinician on day of visit: allergies, medications, problem list, medical history, surgical history, family history, social history, and previous encounter notes. ? ?Time spent on visit including pre-visit chart review and post-visit care and charting was 35 minutes.  ? ? ?I, Trixie Dredge, am acting as  transcriptionist for Dennard Nip, MD. ? ?I have reviewed the above documentation for accuracy and completeness, and I agree with the above. -  Dennard Nip, MD ? ? ?

## 2022-04-03 ENCOUNTER — Encounter (INDEPENDENT_AMBULATORY_CARE_PROVIDER_SITE_OTHER): Payer: Self-pay | Admitting: Family Medicine

## 2022-04-03 ENCOUNTER — Ambulatory Visit (INDEPENDENT_AMBULATORY_CARE_PROVIDER_SITE_OTHER): Payer: Medicare Other | Admitting: Family Medicine

## 2022-04-03 VITALS — BP 118/73 | HR 78 | Temp 98.0°F

## 2022-04-03 DIAGNOSIS — E1169 Type 2 diabetes mellitus with other specified complication: Secondary | ICD-10-CM | POA: Diagnosis not present

## 2022-04-03 DIAGNOSIS — Z7985 Long-term (current) use of injectable non-insulin antidiabetic drugs: Secondary | ICD-10-CM

## 2022-04-03 DIAGNOSIS — Z6839 Body mass index (BMI) 39.0-39.9, adult: Secondary | ICD-10-CM

## 2022-04-03 DIAGNOSIS — E669 Obesity, unspecified: Secondary | ICD-10-CM

## 2022-04-10 ENCOUNTER — Encounter (INDEPENDENT_AMBULATORY_CARE_PROVIDER_SITE_OTHER): Payer: Self-pay | Admitting: Family Medicine

## 2022-04-10 NOTE — Telephone Encounter (Signed)
Please see message and advise.  Thank you. ° °

## 2022-04-10 NOTE — Telephone Encounter (Signed)
Dr.Beasley 

## 2022-04-16 NOTE — Progress Notes (Signed)
? ? ? ?Chief Complaint:  ? ?OBESITY ?Cynthia Short is here to discuss her progress with her obesity treatment plan along with follow-up of her obesity related diagnoses. Cynthia Short is on the Category 2 Plan and states she is following her eating plan approximately 50% of the time. Cynthia Short states she is doing 0 minutes 0 times per week. ? ?Today's visit was #: 5 ?Starting weight: 225 lbs ?Starting date: 11/26/2021 ?Today's weight: 224 lbs ?Today's date: 04/03/2022 ?Total lbs lost to date: 1 ?Total lbs lost since last in-office visit: 0 ? ?Interim History: Cynthia Short will continues to work on her diet, exercise, weight loss. She is frustrated with how difficult weight loss is and willing to look at GLP-1's to help her.  ? ?Subjective:  ? ?1. Type 2 diabetes mellitus with other specified complication, without long-term current use of insulin ([place]) ?Cynthia Short has a prescription of Ozempic but she hasn't started it yet. She has many questions about diabetes mellitus, Ozempic, and how it works and how to take it. She is nervous about doing an injection. ? ?Assessment/Plan:  ? ?1. Type 2 diabetes mellitus with other specified complication, without long-term current use of insulin ([place]) ?All of Cynthia Short's questions were answered, and I gave her the first injection while explaining how to do future injections. She did very well and she is confident that she can continue on her own.  ? ?2. Obesity with current BMI 39.8 ?Cynthia Short is currently in the action stage of change. As such, her goal is to continue with weight loss efforts. She has agreed to the Category 2 Plan.  ? ?Cynthia Short will start GLP-1 for diabetes mellitus, and will continue with diet and exercise and will follow closely. ? ?We will recheck fasting labs at her next visit. ? ?Behavioral modification strategies: increasing vegetables and no skipping meals. ? ?Cynthia Short has agreed to follow-up with our clinic in 4 weeks. She was informed of the importance of frequent follow-up visits to maximize her  success with intensive lifestyle modifications for her multiple health conditions.  ? ?Objective:  ? ?Blood pressure 118/73, pulse 78, temperature 98 ?F (36.7 ?C), SpO2 96 %. ?There is no height or weight on file to calculate BMI. ? ?General: Cooperative, alert, well developed, in no acute distress. ?HEENT: Conjunctivae and lids unremarkable. ?Cardiovascular: Regular rhythm.  ?Lungs: Normal work of breathing. ?Neurologic: No focal deficits.  ? ?Lab Results  ?Component Value Date  ? CREATININE 0.69 11/26/2021  ? BUN 8 11/26/2021  ? NA 139 11/26/2021  ? K 4.3 11/26/2021  ? CL 99 11/26/2021  ? CO2 25 11/26/2021  ? ?Lab Results  ?Component Value Date  ? ALT 18 11/26/2021  ? AST 19 11/26/2021  ? ALKPHOS 98 11/26/2021  ? BILITOT 0.4 11/26/2021  ? ?Lab Results  ?Component Value Date  ? HGBA1C 6.6 (H) 11/26/2021  ? HGBA1C 6.7 (H) 05/07/2021  ? HGBA1C 7.0 12/04/2020  ? HGBA1C 6.7 06/08/2020  ? HGBA1C 6.5 (H) 03/15/2020  ? ?Lab Results  ?Component Value Date  ? INSULIN 37.5 (H) 05/07/2021  ? INSULIN 24.6 03/15/2020  ? INSULIN 20.8 10/31/2019  ? INSULIN 18.5 02/15/2019  ? INSULIN 24.9 11/23/2018  ? ?Lab Results  ?Component Value Date  ? TSH 1.880 11/26/2021  ? ?Lab Results  ?Component Value Date  ? CHOL 162 11/26/2021  ? HDL 42 11/26/2021  ? LDLCALC 101 (H) 11/26/2021  ? TRIG 106 11/26/2021  ? CHOLHDL 3.9 11/26/2021  ? ?Lab Results  ?Component Value Date  ? [place]  51.2 11/26/2021  ? VD25OH 42.4 05/07/2021  ? VD25OH 27.2 12/04/2020  ? ?Lab Results  ?Component Value Date  ? WBC 7.7 11/26/2021  ? HGB 12.2 11/26/2021  ? HCT 38.1 11/26/2021  ? MCV 86 11/26/2021  ? PLT 300 11/26/2021  ? ?No results found for: IRON, TIBC, FERRITIN ? ?Attestation Statements:  ? ?Reviewed by clinician on day of visit: allergies, medications, problem list, medical history, surgical history, family history, social history, and previous encounter notes. ? ?Time spent on visit including pre-visit chart review and post-visit care and charting was 46  minutes.  ? ? ?I, Trixie Dredge, am acting as transcriptionist for Dennard Nip, MD. ? ?I have reviewed the above documentation for accuracy and completeness, and I agree with the above. -  Dennard Nip, MD ? ? ?

## 2022-04-21 DIAGNOSIS — E7800 Pure hypercholesterolemia, unspecified: Secondary | ICD-10-CM | POA: Diagnosis not present

## 2022-04-21 DIAGNOSIS — E1129 Type 2 diabetes mellitus with other diabetic kidney complication: Secondary | ICD-10-CM | POA: Diagnosis not present

## 2022-04-30 ENCOUNTER — Encounter: Payer: Self-pay | Admitting: Pharmacist

## 2022-04-30 DIAGNOSIS — G720 Drug-induced myopathy: Secondary | ICD-10-CM

## 2022-04-30 NOTE — Progress Notes (Signed)
[facility])  ?                                          [facility] Quality Pharmacy Team  ?                                      Statin Quality Measure Assessment ?  ? ?04/30/2022 ? ?Cynthia Short ?May 02, 1956 ?[id number] ? ?  ? ?Per review of chart and payor information, patient has a diagnosis of diabetes but is not currently filling a statin prescription.  This places patient into the SUPD (Statin Use In Patients with Diabetes) measure for CMS.   ? ?Patient has documented allergy to statin but no corresponding CPT codes that would exclude patient from SUPD measure.  She has a follow up appointment with her PCP on 05/01/2022.  If deemed therapeutically appropriate, a statin exclusion code could be associated with the upcoming visit which would remove the patient from the measure or she could be assessed for alternative therapy with a different statin. ? ?The 10-year ASCVD risk score (Arnett DK, et al., 2019) is: 12% ?  Values used to calculate the score: ?    Age: 66 years ?    Sex: Female ?    Is Non-Hispanic African American: No ?    Diabetic: Yes ?    Tobacco smoker: No ?    Systolic Blood Pressure: 546 mmHg ?    Is BP treated: Yes ?    HDL Cholesterol: 42 mg/dL ?    Total Cholesterol: 162 mg/dL ?11/26/2021 ? ?   ?Component Value Date/Time  ? CHOL 162 11/26/2021 1316  ? TRIG 106 11/26/2021 1316  ? HDL 42 11/26/2021 1316  ? CHOLHDL 3.9 11/26/2021 1316  ? LDLCALC 101 (H) 11/26/2021 1316  ? ? ?Please consider ONE of the following recommendations:  ?Initiate high intensity statin Atorvastatin '40mg'$  once daily, #90, 3 refills  ? Rosuvastatin '20mg'$  once daily, #90, 3 refills  ?  ?Initiate moderate intensity  ?        statin with reduced frequency if prior  ?        statin intolerance 1x weekly, #13, 3 refills  ? 2x weekly, #26, 3 refills  ? 3x weekly, #39, 3 refills  ?  ?Code for past statin intolerance or  other exclusions (required annually) ? ? Provider  Requirements:  ?Associate code during an office visit or telehealth encounter  Drug Induced Myopathy G72.0  ? Myopathy, unspecified G72.9  ? Myositis, unspecified M60.9  ? Rhabdomyolysis M62.82  ? Alcoholic fatty liver F68.1  ? Cirrhosis of liver K74.69  ? Prediabetes R73.03  ? PCOS E28.2  ? Toxic liver disease, unspecified K71.9  ?      ? ? ?Plan:  Route note to PCP prior to upcoming appointment. ? ?[name] Guerin, PharmD, BCACP ?[facility] Clinical Pharmacist ?[phone] ? ? ? ? ? ? ?

## 2022-05-01 ENCOUNTER — Ambulatory Visit (INDEPENDENT_AMBULATORY_CARE_PROVIDER_SITE_OTHER): Payer: Medicare Other | Admitting: Family Medicine

## 2022-05-01 ENCOUNTER — Encounter (INDEPENDENT_AMBULATORY_CARE_PROVIDER_SITE_OTHER): Payer: Self-pay | Admitting: Family Medicine

## 2022-05-01 VITALS — BP 114/73 | HR 64 | Temp 98.1°F | Ht 63.0 in | Wt 219.0 lb

## 2022-05-01 DIAGNOSIS — E559 Vitamin D deficiency, unspecified: Secondary | ICD-10-CM

## 2022-05-01 DIAGNOSIS — E669 Obesity, unspecified: Secondary | ICD-10-CM | POA: Diagnosis not present

## 2022-05-01 DIAGNOSIS — E1129 Type 2 diabetes mellitus with other diabetic kidney complication: Secondary | ICD-10-CM

## 2022-05-01 DIAGNOSIS — E538 Deficiency of other specified B group vitamins: Secondary | ICD-10-CM | POA: Diagnosis not present

## 2022-05-01 DIAGNOSIS — Z6838 Body mass index (BMI) 38.0-38.9, adult: Secondary | ICD-10-CM

## 2022-05-01 DIAGNOSIS — R3915 Urgency of urination: Secondary | ICD-10-CM | POA: Diagnosis not present

## 2022-05-01 DIAGNOSIS — E1169 Type 2 diabetes mellitus with other specified complication: Secondary | ICD-10-CM | POA: Diagnosis not present

## 2022-05-02 LAB — CMP14+EGFR
ALT: 27 IU/L (ref 0–32)
AST: 24 IU/L (ref 0–40)
Albumin/Globulin Ratio: 1.4 (ref 1.2–2.2)
Albumin: 4.3 g/dL (ref 3.8–4.8)
Alkaline Phosphatase: 85 IU/L (ref 44–121)
BUN/Creatinine Ratio: 13 (ref 12–28)
BUN: 9 mg/dL (ref 8–27)
Bilirubin Total: 0.3 mg/dL (ref 0.0–1.2)
CO2: 26 mmol/L (ref 20–29)
Calcium: 9.5 mg/dL (ref 8.7–10.3)
Chloride: 101 mmol/L (ref 96–106)
Creatinine, Ser: 0.7 mg/dL (ref 0.57–1.00)
Globulin, Total: 3.1 g/dL (ref 1.5–4.5)
Glucose: 107 mg/dL — ABNORMAL HIGH (ref 70–99)
Potassium: 4.9 mmol/L (ref 3.5–5.2)
Sodium: 142 mmol/L (ref 134–144)
Total Protein: 7.4 g/dL (ref 6.0–8.5)
eGFR: 96 mL/min/{1.73_m2} (ref >59)

## 2022-05-02 LAB — INSULIN, RANDOM: INSULIN: 19.1 u[IU]/mL (ref 2.6–24.9)

## 2022-05-02 LAB — VITAMIN D 25 HYDROXY (VIT D DEFICIENCY, FRACTURES): Vit D, 25-Hydroxy: 25.5 ng/mL — ABNORMAL LOW (ref 30.0–100.0)

## 2022-05-02 LAB — VITAMIN B12: Vitamin B-12: 352 pg/mL (ref 232–1245)

## 2022-05-13 ENCOUNTER — Ambulatory Visit (INDEPENDENT_AMBULATORY_CARE_PROVIDER_SITE_OTHER): Payer: Medicare Other | Admitting: Orthopedic Surgery

## 2022-05-13 ENCOUNTER — Encounter: Payer: Self-pay | Admitting: Orthopedic Surgery

## 2022-05-13 ENCOUNTER — Ambulatory Visit (INDEPENDENT_AMBULATORY_CARE_PROVIDER_SITE_OTHER): Payer: Medicare Other

## 2022-05-13 DIAGNOSIS — M79672 Pain in left foot: Secondary | ICD-10-CM

## 2022-05-13 NOTE — Progress Notes (Signed)
Office Visit Note   Patient: Cynthia Short           Date of Birth: Jan 29, 1956           MRN: [id number] Visit Date: 05/13/2022 Requested by: Gaynelle Arabian, MD [address],  [place] [address] PCP: Gaynelle Arabian, MD  Subjective: Chief Complaint  Patient presents with   Left Foot - Pain    HPI: Cynthia Short is a 66 year old patient with left foot pain of several weeks duration.  Sustained an inversion type injury when she was walking on the playground with her right leg.  Reports pain with weightbearing.  In general she states she has pain about 80% of the steps that she takes.  She has "clicking" pain in the morning but it does get worse as the day goes on.  She has started using a small fracture boot which helps.  Does not have many stairs.  Describes some mild swelling down around the lateral aspect of the foot.  She is on vitamin B12 and vitamin D supplementation.  Blood work was done about a month ago.  Does have a history of diabetes.              ROS: All systems reviewed are negative as they relate to the chief complaint within the history of present illness.  Patient denies  fevers or chills.   Assessment & Plan: Visit Diagnoses:  1. Left foot pain     Plan: Left foot pain with very mild swelling around the knee base of the fifth metatarsal.  Could represent partial tearing of the knee.  Has redness over the and does have very good eversion strength present.  Also to be an occult bony injury which potentially may be exacerbated by low vitamin D and diabetes.  Plan at this time is to continue with the boot for 4 more weeks.  Come back in 4 weeks with repeat radiographs to evaluate the base of the fifth metatarsal.  Possible MRI scan and then based on improvement or no improvement in clinical symptoms. Follow-Up Instructions: Return in about 4 weeks (around 06/10/2022).   Orders:  Orders Placed This Encounter  Procedures   XR Foot Complete Left   No  orders of the defined types were placed in this encounter.     Procedures: No procedures performed   Clinical Data: No additional findings.  Objective: Vital Signs: There were no vitals taken for this visit.  Physical Exam:   Constitutional: Patient appears well-developed HEENT:  Head: Normocephalic Eyes:EOM are normal Neck: Normal range of motion Cardiovascular: Normal rate Pulmonary/chest: Effort normal Neurologic: Patient is alert Skin: Skin is warm Psychiatric: Patient has normal mood and affect   Ortho Exam: Ortho exam demonstrates full active and passive range of motion of the ankle with palpable intact nontender anterior to posterior to peroneal and Achilles tendons.  Slight tenderness on the bone proximal fifth metatarsal but no pain with pronation supination of the forefoot.  Pedal pulses palpable.  Sensation intact on the dorsal and plantar aspect of the foot.  Specialty Comments:  No specialty comments available.  Imaging: XR Foot Complete Left  Result Date: 05/13/2022 AP lateral oblique radiographs left foot reviewed.  No acute fracture.  Mild midfoot degenerative changes.  Tarsometatarsal alignment intact.    PMFS History: Patient Active Problem List   Diagnosis Date Noted   Vitamin D deficiency 05/01/2022   B12 deficiency 05/01/2022   Dyspnea on exertion 07/15/2012  Morbid obesity ([place]) 07/15/2012   GERD (gastroesophageal reflux disease) 07/15/2012   Diabetes mellitus type 2 in obese ([place]) 07/15/2012   Hypertension 07/15/2012   Past Medical History:  Diagnosis Date   Anemia    Anxiety    Arthritis    Asthma    B12 deficiency    Chest pain    Constipation    Diabetes mellitus    Dyspnea    Gallbladder problem    GERD (gastroesophageal reflux disease)    Hair loss    Heart murmur    Heart valve problem    Hiatal hernia    Hip problem    Hyperlipemia    Hypertension    Hypothyroidism    IBS (irritable bowel syndrome)    Joint pain     Knee problem    Leg edema    Osteoarthritis    Thyroid disease    hypothyroidism   Tricuspid valve disorder    Vitamin D deficiency     Family History  Problem Relation Age of Onset   Hyperlipidemia Mother    Cancer Father        lung   Diabetes Father    Heart disease Father    Stroke Father    Kidney disease Father    Obesity Father    Cancer Maternal Grandmother        colon   Heart disease Maternal Grandfather    Cancer Paternal Grandmother        colon   Heart disease Maternal Uncle     Past Surgical History:  Procedure Laterality Date   ABDOMINAL HYSTERECTOMY  3/98   CHOLECYSTECTOMY  02/2006   KNEE SURGERY     MYOMECTOMY  12/93   uterine fibroids   TMJ ARTHROPLASTY  8/82   Social History   Occupational History   Occupation: Pharmacist, hospital  Tobacco Use   Smoking status: Never   Smokeless tobacco: Never  Vaping Use   Vaping Use: Never used  Substance and Sexual Activity   Alcohol use: No   Drug use: No   Sexual activity: Yes

## 2022-05-15 NOTE — Progress Notes (Signed)
   Chief Complaint:   OBESITY Cynthia Short is here to discuss her progress with her obesity treatment plan along with follow-up of her obesity related diagnoses. Cynthia Short is on the Category 2 Plan and states she is following her eating plan approximately 70% of the time. Cynthia Short states she is walking for exercise.    Today's visit was #: 6 Starting weight: 225 lbs Starting date: 11/26/2021 Today's weight: 219 lbs Today's date: 05/01/2022 Total lbs lost to date: 6 Total lbs lost since last in-office visit: 5  Interim History: Tarri continues to do well with weight loss. Her hunger is mostly controlled. She is working on meeting her protein goals.   Subjective:   1. Type 2 diabetes mellitus with microalbuminuria, without long-term current use of insulin (HCC) Cynthia Short is on Ozempic, and she is due to have labs.  2. Vitamin D deficiency Cynthia Short is stable on Vitamin D, and she is due for labs.   3. B12 deficiency Cynthia Short is stable on B12 supplement, and she is due to have labs.  Assessment/Plan:   1. Type 2 diabetes mellitus with microalbuminuria, without long-term current use of insulin (HCC) We will check labs today. Kayce agreed to increase Ozempic to 0.5 mg q week, and we will refill for 1 month.  - CMP14+EGFR - Insulin, random  2. Vitamin D deficiency Low Vitamin D level contributes to fatigue and are associated with obesity, breast, and colon cancer. We will check labs today. Cynthia Short will follow-up for routine testing of Vitamin D, at least 2-3 times per year to avoid over-replacement.  - VITAMIN D 25 Hydroxy (Vit-D Deficiency, Fractures)  3. B12 deficiency The diagnosis was reviewed with the patient. We will check labs today. Orders and follow up as documented in patient record.  - Vitamin B12  4. Obesity, Current BMI 38.8 Cynthia Short is currently in the action stage of change. As such, her goal is to continue with weight loss efforts. She has agreed to the Category 2 Plan.   Exercise  goals: As is.  Behavioral modification strategies: increasing lean protein intake.  Cynthia Short has agreed to follow-up with our clinic in 3 to 4 weeks. She was informed of the importance of frequent follow-up visits to maximize her success with intensive lifestyle modifications for her multiple health conditions.   Cynthia Short was informed we would discuss her lab results at her next visit unless there is a critical issue that needs to be addressed sooner. Cynthia Short agreed to keep her next visit at the agreed upon time to discuss these results.  Objective:   Blood pressure 114/73, pulse 64, temperature 98.1 F (36.7 C), height 5' 3" (1.6 m), weight 219 lb (99.3 kg), SpO2 92 %. Body mass index is 38.79 kg/m.  General: Cooperative, alert, well developed, in no acute distress. HEENT: Conjunctivae and lids unremarkable. Cardiovascular: Regular rhythm.  Lungs: Normal work of breathing. Neurologic: No focal deficits.   Lab Results  Component Value Date   CREATININE 0.70 05/01/2022   BUN 9 05/01/2022   NA 142 05/01/2022   K 4.9 05/01/2022   CL 101 05/01/2022   CO2 26 05/01/2022   Lab Results  Component Value Date   ALT 27 05/01/2022   AST 24 05/01/2022   ALKPHOS 85 05/01/2022   BILITOT 0.3 05/01/2022   Lab Results  Component Value Date   HGBA1C 6.6 (H) 11/26/2021   HGBA1C 6.7 (H) 05/07/2021   HGBA1C 7.0 12/04/2020   HGBA1C 6.7 06/08/2020   HGBA1C 6.5 (H) 03/15/2020     Lab Results  Component Value Date   INSULIN 19.1 05/01/2022   INSULIN 37.5 (H) 05/07/2021   INSULIN 24.6 03/15/2020   INSULIN 20.8 10/31/2019   INSULIN 18.5 02/15/2019   Lab Results  Component Value Date   TSH 1.880 11/26/2021   Lab Results  Component Value Date   CHOL 162 11/26/2021   HDL 42 11/26/2021   LDLCALC 101 (H) 11/26/2021   TRIG 106 11/26/2021   CHOLHDL 3.9 11/26/2021   Lab Results  Component Value Date   VD25OH 25.5 (L) 05/01/2022   VD25OH 51.2 11/26/2021   VD25OH 42.4 05/07/2021   Lab Results   Component Value Date   WBC 7.7 11/26/2021   HGB 12.2 11/26/2021   HCT 38.1 11/26/2021   MCV 86 11/26/2021   PLT 300 11/26/2021   No results found for: IRON, TIBC, FERRITIN  Attestation Statements:   Reviewed by clinician on day of visit: allergies, medications, problem list, medical history, surgical history, family history, social history, and previous encounter notes.   I, Sharon Martin, am acting as transcriptionist for Caren Beasley, MD.  I have reviewed the above documentation for accuracy and completeness, and I agree with the above. -  Caren Beasley, MD   

## 2022-05-19 ENCOUNTER — Encounter (INDEPENDENT_AMBULATORY_CARE_PROVIDER_SITE_OTHER): Payer: Self-pay | Admitting: Family Medicine

## 2022-05-19 NOTE — Telephone Encounter (Signed)
Dr.Beasley 

## 2022-05-28 ENCOUNTER — Encounter (INDEPENDENT_AMBULATORY_CARE_PROVIDER_SITE_OTHER): Payer: Self-pay | Admitting: Family Medicine

## 2022-05-28 ENCOUNTER — Ambulatory Visit (INDEPENDENT_AMBULATORY_CARE_PROVIDER_SITE_OTHER): Payer: Medicare Other | Admitting: Family Medicine

## 2022-05-28 VITALS — BP 128/74 | HR 72 | Temp 98.1°F | Ht 63.0 in | Wt 212.0 lb

## 2022-05-28 DIAGNOSIS — E538 Deficiency of other specified B group vitamins: Secondary | ICD-10-CM

## 2022-05-28 DIAGNOSIS — E559 Vitamin D deficiency, unspecified: Secondary | ICD-10-CM | POA: Diagnosis not present

## 2022-05-28 DIAGNOSIS — E669 Obesity, unspecified: Secondary | ICD-10-CM | POA: Diagnosis not present

## 2022-05-28 DIAGNOSIS — E1129 Type 2 diabetes mellitus with other diabetic kidney complication: Secondary | ICD-10-CM

## 2022-05-28 DIAGNOSIS — Z7984 Long term (current) use of oral hypoglycemic drugs: Secondary | ICD-10-CM

## 2022-05-28 DIAGNOSIS — Z7985 Long-term (current) use of injectable non-insulin antidiabetic drugs: Secondary | ICD-10-CM | POA: Diagnosis not present

## 2022-05-28 DIAGNOSIS — Z6832 Body mass index (BMI) 32.0-32.9, adult: Secondary | ICD-10-CM

## 2022-05-28 MED ORDER — VITAMIN D (ERGOCALCIFEROL) 1.25 MG (50000 UNIT) PO CAPS: 50000.0000 [IU] | ORAL_CAPSULE | ORAL | 1 refills | Status: DC

## 2022-05-28 NOTE — Progress Notes (Signed)
Chief Complaint:   OBESITY Cynthia Short is here to discuss her progress with her obesity treatment plan along with follow-up of her obesity related diagnoses. Cynthia Short is on the Category 2 Plan and states she is following her eating plan approximately 75% of the time. Cynthia Short states she is doing 0 minutes 0 times per week.  Today's visit was #: 7 Starting weight: 225 lbs Starting date: 11/26/2021 Today's weight: 212 lbs Today's date: 05/28/2022 Total lbs lost to date: 13 Total lbs lost since last in-office visit: 7  Interim History: Cynthia Short is working on her diet and weight loss.  She is not journaling, but she is mindful and she is portion controlling and making smarter choices.  She may not be meeting her protein goals however.  Subjective:   1. Type 2 diabetes mellitus with microalbuminuria, without long-term current use of insulin (HCC) Cynthia Short's fasting blood sugars mostly range between 100-110.  She is stable on metformin and Ozempic.  She is having some loose stools.  I discussed labs with the patient today.  2. Vitamin D deficiency Cynthia Short's vitamin D level is worsening and dropping low again.  She notes fatigue.  I discussed labs with the patient today.  3. B12 deficiency Cynthia Short's B12 has decreased, possibly due to decreased protein or may be due to metformin.  She notes an increase in tingling in her feet at night recently.  I discussed labs with the patient today.  Assessment/Plan:   1. Type 2 diabetes mellitus with microalbuminuria, without long-term current use of insulin (HCC) Cynthia Short will continue metformin and Ozempic, and will work on decreasing simple carbohydrates in her diet.  2. Vitamin D deficiency We will refill prescription Vitamin D 50,000 IU every week for 2 months. Cynthia Short will follow-up for routine testing of Vitamin D, at least 2-3 times per year to avoid over-replacement.  - Vitamin D, Ergocalciferol, (DRISDOL) 1.25 MG (50000 UNIT) CAPS capsule; Take 1 capsule (50,000  Units total) by mouth every 7 (seven) days.  Dispense: 4 capsule; Refill: 1  3. B12 deficiency Cynthia Short will work on [employer] fundraiser and continue OTC B12.  We will recheck labs in 2 months.  4. Obesity, Current BMI 32.1 Cynthia Short is currently in the action stage of change. As such, her goal is to continue with weight loss efforts. She has agreed to keeping a food journal and adhering to recommended goals of 1200 calories and 85+ grams of protein daily.   Exercise goals: Start chair yoga videos.   Behavioral modification strategies: increasing lean protein intake.  Cynthia Short has agreed to follow-up with our clinic in 4 weeks. She was informed of the importance of frequent follow-up visits to maximize her success with intensive lifestyle modifications for her multiple health conditions.   Objective:   Blood pressure 128/74, pulse 72, temperature 98.1 F (36.7 C), height '5\' 3"'$  (1.6 m), weight 212 lb (96.2 kg), SpO2 97 %. Body mass index is 37.55 kg/m.  General: Cooperative, alert, well developed, in no acute distress. HEENT: Conjunctivae and lids unremarkable. Cardiovascular: Regular rhythm.  Lungs: Normal work of breathing. Neurologic: No focal deficits.   Lab Results  Component Value Date   CREATININE 0.70 05/01/2022   BUN 9 05/01/2022   NA 142 05/01/2022   K 4.9 05/01/2022   CL 101 05/01/2022   CO2 26 05/01/2022   Lab Results  Component Value Date   ALT 27 05/01/2022   AST 24 05/01/2022   ALKPHOS 85 05/01/2022   BILITOT 0.3  05/01/2022   Lab Results  Component Value Date   HGBA1C 6.6 (H) 11/26/2021   HGBA1C 6.7 (H) 05/07/2021   HGBA1C 7.0 12/04/2020   HGBA1C 6.7 06/08/2020   HGBA1C 6.5 (H) 03/15/2020   Lab Results  Component Value Date   INSULIN 19.1 05/01/2022   INSULIN 37.5 (H) 05/07/2021   INSULIN 24.6 03/15/2020   INSULIN 20.8 10/31/2019   INSULIN 18.5 02/15/2019   Lab Results  Component Value Date   TSH 1.880 11/26/2021   Lab Results  Component Value Date    CHOL 162 11/26/2021   HDL 42 11/26/2021   LDLCALC 101 (H) 11/26/2021   TRIG 106 11/26/2021   CHOLHDL 3.9 11/26/2021   Lab Results  Component Value Date   VD25OH 25.5 (L) 05/01/2022   VD25OH 51.2 11/26/2021   VD25OH 42.4 05/07/2021   Lab Results  Component Value Date   WBC 7.7 11/26/2021   HGB 12.2 11/26/2021   HCT 38.1 11/26/2021   MCV 86 11/26/2021   PLT 300 11/26/2021   No results found for: "IRON", "TIBC", "FERRITIN"  Attestation Statements:   Reviewed by clinician on day of visit: allergies, medications, problem list, medical history, surgical history, family history, social history, and previous encounter notes.  Time spent on visit including pre-visit chart review and post-visit care and charting was 40 minutes.   I, Trixie Dredge, am acting as transcriptionist for Dennard Nip, MD.  I have reviewed the above documentation for accuracy and completeness, and I agree with the above. -  Dennard Nip, MD

## 2022-06-11 ENCOUNTER — Encounter: Payer: Self-pay | Admitting: Orthopedic Surgery

## 2022-06-11 ENCOUNTER — Ambulatory Visit (INDEPENDENT_AMBULATORY_CARE_PROVIDER_SITE_OTHER): Payer: Medicare Other | Admitting: Surgical

## 2022-06-11 ENCOUNTER — Ambulatory Visit (INDEPENDENT_AMBULATORY_CARE_PROVIDER_SITE_OTHER): Payer: Medicare Other

## 2022-06-11 DIAGNOSIS — M79672 Pain in left foot: Secondary | ICD-10-CM

## 2022-06-11 NOTE — Progress Notes (Signed)
Office Visit Note   Patient: Cynthia Short           Date of Birth: September 04, 1956           MRN: [id number] Visit Date: 06/11/2022 Requested by: Gaynelle Arabian, MD [address] [place],  [place] [address] PCP: Gaynelle Arabian, MD  Subjective: Chief Complaint  Patient presents with   Left Foot - Follow-up    HPI: Cynthia Short is a 66 y.o. female who presents to the office complaining of lateral left foot pain.  Patient localizes continued pain to the base of the fifth metatarsal.  Wearing a fracture shoe.  She notes pain with each step and occasional pain that aches at baseline.  Pain wakes her up at night when she tries to lay on her left side.  No weakness or mechanical symptoms noted..                ROS: All systems reviewed are negative as they relate to the chief complaint within the history of present illness.  Patient denies fevers or chills.  Assessment & Plan: Visit Diagnoses:  1. Left foot pain     Plan: Patient is a 66 year old female who presents for evaluation of left foot pain.  She has continued tenderness over the fifth metatarsal that has not improved at all since her last appointment.  She has been using a fracture shoe.  With lack of improvement and continued tenderness and no significant radiographic findings, plan for further evaluation with MRI of the left foot.  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  Plan to evaluate for stress fracture versus peroneal tendon tear with MRI and follow-up to review results.  Follow-Up Instructions: No follow-ups on file.   Orders:  Orders Placed This Encounter  Procedures   XR Foot Complete Left   MR Foot Left w/o contrast   No orders of the defined types were placed in this encounter.     Procedures: No procedures  performed   Clinical Data: No additional findings.  Objective: Vital Signs: There were no vitals taken for this visit.  Physical Exam:  Constitutional: Patient appears well-developed HEENT:  Head: Normocephalic Eyes:EOM are normal Neck: Normal range of motion Cardiovascular: Normal rate Pulmonary/chest: Effort normal Neurologic: Patient is alert Skin: Skin is warm Psychiatric: Patient has normal mood and affect  Ortho Exam: Ortho exam demonstrates left foot with palpable pedal pulses.  Intact ankle dorsiflexion, plantarflexion, inversion, eversion.  Mildly increased pain with active plantarflexion.  Increased pain with resisted eversion.  She has tenderness over the fifth metatarsal base primarily.  Mild swelling noted in this location compared with contralateral foot.  No calf tenderness.  Negative Homans' sign.  No tenderness over the lateral malleolus, medial malleolus, Lisfranc complex, Achilles tendon, Achilles tendon insertion.  Specialty Comments:  No specialty comments available.  Imaging: No results found.   PMFS History: Patient Active Problem List   Diagnosis Date Noted   Vitamin D deficiency 05/01/2022   B12 deficiency 05/01/2022   Dyspnea on exertion 07/15/2012   Morbid obesity ([place]) 07/15/2012   GERD (gastroesophageal reflux disease) 07/15/2012   Diabetes mellitus type 2 in obese ([place]) 07/15/2012   Hypertension 07/15/2012   Past Medical History:  Diagnosis Date   Anemia    Anxiety  Arthritis    Asthma    B12 deficiency    Chest pain    Constipation    Diabetes mellitus    Dyspnea    Gallbladder problem    GERD (gastroesophageal reflux disease)    Hair loss    Heart murmur    Heart valve problem    Hiatal hernia    Hip problem    Hyperlipemia    Hypertension    Hypothyroidism    IBS (irritable bowel syndrome)    Joint pain    Knee problem    Leg edema    Osteoarthritis    Thyroid disease    hypothyroidism   Tricuspid valve disorder     Vitamin D deficiency     Family History  Problem Relation Age of Onset   Hyperlipidemia Mother    Cancer Father        lung   Diabetes Father    Heart disease Father    Stroke Father    Kidney disease Father    Obesity Father    Cancer Maternal Grandmother        colon   Heart disease Maternal Grandfather    Cancer Paternal Grandmother        colon   Heart disease Maternal Uncle     Past Surgical History:  Procedure Laterality Date   ABDOMINAL HYSTERECTOMY  3/98   CHOLECYSTECTOMY  02/2006   KNEE SURGERY     MYOMECTOMY  12/93   uterine fibroids   TMJ ARTHROPLASTY  8/82   Social History   Occupational History   Occupation: Pharmacist, hospital  Tobacco Use   Smoking status: Never   Smokeless tobacco: Never  Vaping Use   Vaping Use: Never used  Substance and Sexual Activity   Alcohol use: No   Drug use: No   Sexual activity: Yes

## 2022-06-14 ENCOUNTER — Other Ambulatory Visit (INDEPENDENT_AMBULATORY_CARE_PROVIDER_SITE_OTHER): Payer: Self-pay | Admitting: Family Medicine

## 2022-06-14 DIAGNOSIS — E1169 Type 2 diabetes mellitus with other specified complication: Secondary | ICD-10-CM

## 2022-06-25 ENCOUNTER — Ambulatory Visit (INDEPENDENT_AMBULATORY_CARE_PROVIDER_SITE_OTHER): Payer: BC Managed Care – PPO | Admitting: Family Medicine

## 2022-07-07 DIAGNOSIS — J45909 Unspecified asthma, uncomplicated: Secondary | ICD-10-CM | POA: Diagnosis not present

## 2022-07-07 DIAGNOSIS — E039 Hypothyroidism, unspecified: Secondary | ICD-10-CM | POA: Diagnosis not present

## 2022-07-07 DIAGNOSIS — I10 Essential (primary) hypertension: Secondary | ICD-10-CM | POA: Diagnosis not present

## 2022-07-07 DIAGNOSIS — E559 Vitamin D deficiency, unspecified: Secondary | ICD-10-CM | POA: Diagnosis not present

## 2022-07-07 DIAGNOSIS — K219 Gastro-esophageal reflux disease without esophagitis: Secondary | ICD-10-CM | POA: Diagnosis not present

## 2022-07-07 DIAGNOSIS — E11319 Type 2 diabetes mellitus with unspecified diabetic retinopathy without macular edema: Secondary | ICD-10-CM | POA: Diagnosis not present

## 2022-07-07 DIAGNOSIS — E7800 Pure hypercholesterolemia, unspecified: Secondary | ICD-10-CM | POA: Diagnosis not present

## 2022-07-07 DIAGNOSIS — E538 Deficiency of other specified B group vitamins: Secondary | ICD-10-CM | POA: Diagnosis not present

## 2022-07-07 DIAGNOSIS — E1129 Type 2 diabetes mellitus with other diabetic kidney complication: Secondary | ICD-10-CM | POA: Diagnosis not present

## 2022-07-07 DIAGNOSIS — G722 Myopathy due to other toxic agents: Secondary | ICD-10-CM | POA: Diagnosis not present

## 2022-07-09 ENCOUNTER — Ambulatory Visit (INDEPENDENT_AMBULATORY_CARE_PROVIDER_SITE_OTHER): Payer: Medicare Other | Admitting: Family Medicine

## 2022-07-09 ENCOUNTER — Encounter (INDEPENDENT_AMBULATORY_CARE_PROVIDER_SITE_OTHER): Payer: Self-pay | Admitting: Family Medicine

## 2022-07-09 VITALS — BP 129/73 | HR 65 | Temp 98.0°F | Ht 63.0 in | Wt 209.0 lb

## 2022-07-09 DIAGNOSIS — E669 Obesity, unspecified: Secondary | ICD-10-CM | POA: Diagnosis not present

## 2022-07-09 DIAGNOSIS — Z6837 Body mass index (BMI) 37.0-37.9, adult: Secondary | ICD-10-CM

## 2022-07-09 DIAGNOSIS — E1169 Type 2 diabetes mellitus with other specified complication: Secondary | ICD-10-CM | POA: Diagnosis not present

## 2022-07-09 DIAGNOSIS — E119 Type 2 diabetes mellitus without complications: Secondary | ICD-10-CM | POA: Insufficient documentation

## 2022-07-09 DIAGNOSIS — Z794 Long term (current) use of insulin: Secondary | ICD-10-CM | POA: Diagnosis not present

## 2022-07-09 DIAGNOSIS — Z7985 Long-term (current) use of injectable non-insulin antidiabetic drugs: Secondary | ICD-10-CM

## 2022-07-09 DIAGNOSIS — I10 Essential (primary) hypertension: Secondary | ICD-10-CM

## 2022-07-09 MED ORDER — OZEMPIC (0.25 OR 0.5 MG/DOSE) 2 MG/3ML ~~LOC~~ SOPN: 0.5000 mg | PEN_INJECTOR | SUBCUTANEOUS | 0 refills | Status: DC

## 2022-07-16 NOTE — Progress Notes (Unsigned)
Chief Complaint:   OBESITY Cynthia Short is here to discuss her progress with her obesity treatment plan along with follow-up of her obesity related diagnoses. Cynthia Short is on keeping a food journal and adhering to recommended goals of 1200 calories and 85+ grams of protein daily and states she is following her eating plan approximately 70% of the time. Cynthia Short states she is walking for 20 minutes 2 times per week.  Today's visit was #: 8 Starting weight: 225 lbs Starting date: 11/26/2021 Today's weight: 209 lbs Today's date: 07/09/2022 Total lbs lost to date: 16 Total lbs lost since last in-office visit: 3  Interim History: Cynthia Short continues to do well with weight loss.  She is doing well with increasing protein and meal planning.  Her hunger is mostly controlled.  Subjective:   1. Type 2 diabetes mellitus with other specified complication, with long-term current use of insulin (HCC) Cynthia Short is working on her diet and weight loss, and decreasing simple carbohydrates.  She restarted Ozempic and she is having some fluctuating constipation and loose stools.  2. Essential hypertension Cynthia Short's blood pressure remains well controlled with her diet and medications.  She denies chest pain or headache.  Assessment/Plan:   1. Type 2 diabetes mellitus with other specified complication, with long-term current use of insulin (HCC) We will refill Ozempic 0.5 mg once weekly for 1 month. Cynthia Short is to increase her water and fiber intake, and we will avoid increasing her dose until the symptoms resolve.  - Semaglutide,0.25 or 0.'5MG'$ /DOS, (OZEMPIC, 0.25 OR 0.5 MG/DOSE,) 2 MG/3ML SOPN; Inject 0.5 mg into the skin once a week.  Dispense: 3 mL; Refill: 0  2. Essential hypertension Cynthia Short will continue as is, and she will watch for signs of hypotension.  3. Obesity, Current BMI 37.1 Cynthia Short is currently in the action stage of change. As such, her goal is to continue with weight loss efforts. She has agreed to keeping a  food journal and adhering to recommended goals of 1200 calories and 85+ grams of protein daily.   Exercise goals: As is.   Behavioral modification strategies: increasing lean protein intake, increasing water intake, and increasing high fiber foods.  Cynthia Short has agreed to follow-up with our clinic in 3 to 4 weeks. She was informed of the importance of frequent follow-up visits to maximize her success with intensive lifestyle modifications for her multiple health conditions.   Objective:   Blood pressure 129/73, pulse 65, temperature 98 F (36.7 C), height '5\' 3"'$  (1.6 m), weight 209 lb (94.8 kg), SpO2 96 %. Body mass index is 37.02 kg/m.  General: Cooperative, alert, well developed, in no acute distress. HEENT: Conjunctivae and lids unremarkable. Cardiovascular: Regular rhythm.  Lungs: Normal work of breathing. Neurologic: No focal deficits.   Lab Results  Component Value Date   CREATININE 0.70 05/01/2022   BUN 9 05/01/2022   NA 142 05/01/2022   K 4.9 05/01/2022   CL 101 05/01/2022   CO2 26 05/01/2022   Lab Results  Component Value Date   ALT 27 05/01/2022   AST 24 05/01/2022   ALKPHOS 85 05/01/2022   BILITOT 0.3 05/01/2022   Lab Results  Component Value Date   HGBA1C 6.6 (H) 11/26/2021   HGBA1C 6.7 (H) 05/07/2021   HGBA1C 7.0 12/04/2020   HGBA1C 6.7 06/08/2020   HGBA1C 6.5 (H) 03/15/2020   Lab Results  Component Value Date   INSULIN 19.1 05/01/2022   INSULIN 37.5 (H) 05/07/2021   INSULIN 24.6 03/15/2020  INSULIN 20.8 10/31/2019   INSULIN 18.5 02/15/2019   Lab Results  Component Value Date   TSH 1.880 11/26/2021   Lab Results  Component Value Date   CHOL 162 11/26/2021   HDL 42 11/26/2021   LDLCALC 101 (H) 11/26/2021   TRIG 106 11/26/2021   CHOLHDL 3.9 11/26/2021   Lab Results  Component Value Date   VD25OH 25.5 (L) 05/01/2022   VD25OH 51.2 11/26/2021   VD25OH 42.4 05/07/2021   Lab Results  Component Value Date   WBC 7.7 11/26/2021   HGB 12.2  11/26/2021   HCT 38.1 11/26/2021   MCV 86 11/26/2021   PLT 300 11/26/2021   No results found for: "IRON", "TIBC", "FERRITIN"  Attestation Statements:   Reviewed by clinician on day of visit: allergies, medications, problem list, medical history, surgical history, family history, social history, and previous encounter notes.  Time spent on visit including pre-visit chart review and post-visit care and charting was 50 minutes.  I, Trixie Dredge, am acting as transcriptionist for Dennard Nip, MD.  I have reviewed the above documentation for accuracy and completeness, and I agree with the above. -  Dennard Nip, MD

## 2022-07-21 DIAGNOSIS — H52223 Regular astigmatism, bilateral: Secondary | ICD-10-CM | POA: Diagnosis not present

## 2022-07-21 DIAGNOSIS — E119 Type 2 diabetes mellitus without complications: Secondary | ICD-10-CM | POA: Diagnosis not present

## 2022-07-21 DIAGNOSIS — H524 Presbyopia: Secondary | ICD-10-CM | POA: Diagnosis not present

## 2022-07-21 DIAGNOSIS — H5203 Hypermetropia, bilateral: Secondary | ICD-10-CM | POA: Diagnosis not present

## 2022-07-23 ENCOUNTER — Encounter (INDEPENDENT_AMBULATORY_CARE_PROVIDER_SITE_OTHER): Payer: Self-pay

## 2022-08-04 DIAGNOSIS — E538 Deficiency of other specified B group vitamins: Secondary | ICD-10-CM | POA: Diagnosis not present

## 2022-08-04 DIAGNOSIS — E1129 Type 2 diabetes mellitus with other diabetic kidney complication: Secondary | ICD-10-CM | POA: Diagnosis not present

## 2022-08-04 DIAGNOSIS — E559 Vitamin D deficiency, unspecified: Secondary | ICD-10-CM | POA: Diagnosis not present

## 2022-08-04 DIAGNOSIS — E7800 Pure hypercholesterolemia, unspecified: Secondary | ICD-10-CM | POA: Diagnosis not present

## 2022-08-04 DIAGNOSIS — I10 Essential (primary) hypertension: Secondary | ICD-10-CM | POA: Diagnosis not present

## 2022-08-04 DIAGNOSIS — E039 Hypothyroidism, unspecified: Secondary | ICD-10-CM | POA: Diagnosis not present

## 2022-08-06 ENCOUNTER — Ambulatory Visit (INDEPENDENT_AMBULATORY_CARE_PROVIDER_SITE_OTHER): Payer: Medicare Other | Admitting: Family Medicine

## 2022-08-06 ENCOUNTER — Encounter (INDEPENDENT_AMBULATORY_CARE_PROVIDER_SITE_OTHER): Payer: Self-pay | Admitting: Family Medicine

## 2022-08-06 VITALS — BP 131/73 | HR 66 | Temp 97.7°F | Ht 63.0 in | Wt 209.0 lb

## 2022-08-06 DIAGNOSIS — Z6839 Body mass index (BMI) 39.0-39.9, adult: Secondary | ICD-10-CM

## 2022-08-06 DIAGNOSIS — E1169 Type 2 diabetes mellitus with other specified complication: Secondary | ICD-10-CM

## 2022-08-06 DIAGNOSIS — Z7985 Long-term (current) use of injectable non-insulin antidiabetic drugs: Secondary | ICD-10-CM | POA: Diagnosis not present

## 2022-08-06 DIAGNOSIS — E538 Deficiency of other specified B group vitamins: Secondary | ICD-10-CM | POA: Diagnosis not present

## 2022-08-06 DIAGNOSIS — I10 Essential (primary) hypertension: Secondary | ICD-10-CM | POA: Diagnosis not present

## 2022-08-06 DIAGNOSIS — Z794 Long term (current) use of insulin: Secondary | ICD-10-CM | POA: Diagnosis not present

## 2022-08-06 DIAGNOSIS — E669 Obesity, unspecified: Secondary | ICD-10-CM

## 2022-08-06 DIAGNOSIS — Z6837 Body mass index (BMI) 37.0-37.9, adult: Secondary | ICD-10-CM

## 2022-08-06 MED ORDER — CYANOCOBALAMIN 1000 MCG PO TABS: 2000.0000 ug | ORAL_TABLET | Freq: Every day | ORAL | 0 refills | Status: DC

## 2022-08-06 MED ORDER — OZEMPIC (0.25 OR 0.5 MG/DOSE) 2 MG/3ML ~~LOC~~ SOPN: 0.5000 mg | PEN_INJECTOR | SUBCUTANEOUS | 0 refills | Status: DC

## 2022-08-14 NOTE — Progress Notes (Signed)
Chief Complaint:   OBESITY Cynthia Short is here to discuss her progress with her obesity treatment plan along with follow-up of her obesity related diagnoses. Cynthia Short is on keeping a food journal and adhering to recommended goals of 1200 calories and 85+ grams of protein daily and states she is following her eating plan approximately 70% of the time. Cynthia Short states she is walking for 20 minutes 2 times per week.  Today's visit was #: 9 Starting weight: 225 lbs Starting date: 11/26/2021 Today's weight: 209 lbs Today's date: 08/06/2022 Total lbs lost to date: 16 Total lbs lost since last in-office visit: 0  Interim History: Cynthia Short has done some traveling and has been off her normal routine.  She has not been able to exercise due to flareup of knee pain.  Subjective:   1. Essential hypertension Cynthia Short's blood pressure is well controlled with decrease dose of Cozaar.  She continues to work on her diet and weight loss.  2. Vitamin B 12 deficiency Cynthia Short is on B12 but her labs done at her PCP showed her B12 is still below goal.  3. Type 2 diabetes mellitus with other specified complication, with long-term current use of insulin (Cynthia Short) Cynthia Short is stable on Ozempic, with minimal side effects noted.  She continues to work on her weight loss.  Assessment/Plan:   1. Essential hypertension K[place] will continue with her diet and exercise, and we will continue to follow.  2. Vitamin B 12 deficiency Cynthia Short agreed to increase B12 to 1000 mcg 2 tablets by mouth daily, and we will refill for 1 month.  - cyanocobalamin ([facility] VITAMIN B12) 1000 MCG tablet; Take 2 tablets (2,000 mcg total) by mouth daily.  Dispense: 60 tablet; Refill: 0  3. Type 2 diabetes mellitus with other specified complication, with long-term current use of insulin (Cynthia Short) Cynthia Short will continue Ozempic at 0.5 mg once weekly, and we will refill for 1 month.  - Semaglutide,0.25 or 0.'5MG'$ /DOS, (OZEMPIC, 0.25 OR 0.5 MG/DOSE,) 2 MG/3ML SOPN; Inject  0.5 mg into the skin once a week.  Dispense: 3 mL; Refill: 0  4. Obesity, Current BMI 37.2 Cynthia Short is currently in the action stage of change. As such, her goal is to continue with weight loss efforts. She has agreed to change to the Category 2 Plan.   Exercise goals: As is.   Behavioral modification strategies: increasing lean protein intake and meal planning and cooking strategies.  Cynthia Short has agreed to follow-up with our clinic in 3 to 4 weeks. She was informed of the importance of frequent follow-up visits to maximize her success with intensive lifestyle modifications for her multiple health conditions.   Objective:   Blood pressure 131/73, pulse 66, temperature 97.7 F (36.5 C), height '5\' 3"'$  (1.6 m), weight 209 lb (94.8 kg), SpO2 98 %. Body mass index is 37.02 kg/m.  General: Cooperative, alert, well developed, in no acute distress. HEENT: Conjunctivae and lids unremarkable. Cardiovascular: Regular rhythm.  Lungs: Normal work of breathing. Neurologic: No focal deficits.   Lab Results  Component Value Date   CREATININE 0.70 05/01/2022   BUN 9 05/01/2022   NA 142 05/01/2022   K 4.9 05/01/2022   CL 101 05/01/2022   CO2 26 05/01/2022   Lab Results  Component Value Date   ALT 27 05/01/2022   AST 24 05/01/2022   ALKPHOS 85 05/01/2022   BILITOT 0.3 05/01/2022   Lab Results  Component Value Date   HGBA1C 6.6 (H) 11/26/2021   HGBA1C 6.7 (H)  05/07/2021   HGBA1C 7.0 12/04/2020   HGBA1C 6.7 06/08/2020   HGBA1C 6.5 (H) 03/15/2020   Lab Results  Component Value Date   INSULIN 19.1 05/01/2022   INSULIN 37.5 (H) 05/07/2021   INSULIN 24.6 03/15/2020   INSULIN 20.8 10/31/2019   INSULIN 18.5 02/15/2019   Lab Results  Component Value Date   TSH 1.880 11/26/2021   Lab Results  Component Value Date   CHOL 162 11/26/2021   HDL 42 11/26/2021   LDLCALC 101 (H) 11/26/2021   TRIG 106 11/26/2021   CHOLHDL 3.9 11/26/2021   Lab Results  Component Value Date   VD25OH 25.5 (L)  05/01/2022   VD25OH 51.2 11/26/2021   VD25OH 42.4 05/07/2021   Lab Results  Component Value Date   WBC 7.7 11/26/2021   HGB 12.2 11/26/2021   HCT 38.1 11/26/2021   MCV 86 11/26/2021   PLT 300 11/26/2021   No results found for: "IRON", "TIBC", "FERRITIN"  Attestation Statements:   Reviewed by clinician on day of visit: allergies, medications, problem list, medical history, surgical history, family history, social history, and previous encounter notes.   I, Trixie Dredge, am acting as transcriptionist for Dennard Nip, MD.  I have reviewed the above documentation for accuracy and completeness, and I agree with the above. -  Dennard Nip, MD

## 2022-09-01 ENCOUNTER — Other Ambulatory Visit (INDEPENDENT_AMBULATORY_CARE_PROVIDER_SITE_OTHER): Payer: Self-pay | Admitting: Family Medicine

## 2022-09-01 DIAGNOSIS — E538 Deficiency of other specified B group vitamins: Secondary | ICD-10-CM

## 2022-09-08 ENCOUNTER — Other Ambulatory Visit (INDEPENDENT_AMBULATORY_CARE_PROVIDER_SITE_OTHER): Payer: Self-pay | Admitting: Family Medicine

## 2022-09-08 DIAGNOSIS — E1169 Type 2 diabetes mellitus with other specified complication: Secondary | ICD-10-CM

## 2022-09-09 ENCOUNTER — Ambulatory Visit (INDEPENDENT_AMBULATORY_CARE_PROVIDER_SITE_OTHER): Payer: Medicare Other | Admitting: Family Medicine

## 2022-09-09 ENCOUNTER — Encounter (INDEPENDENT_AMBULATORY_CARE_PROVIDER_SITE_OTHER): Payer: Self-pay | Admitting: Family Medicine

## 2022-09-09 VITALS — BP 127/78 | HR 69 | Temp 98.1°F | Ht 63.0 in | Wt 209.0 lb

## 2022-09-09 DIAGNOSIS — Z7984 Long term (current) use of oral hypoglycemic drugs: Secondary | ICD-10-CM | POA: Diagnosis not present

## 2022-09-09 DIAGNOSIS — Z7985 Long-term (current) use of injectable non-insulin antidiabetic drugs: Secondary | ICD-10-CM | POA: Diagnosis not present

## 2022-09-09 DIAGNOSIS — E1169 Type 2 diabetes mellitus with other specified complication: Secondary | ICD-10-CM | POA: Diagnosis not present

## 2022-09-09 DIAGNOSIS — E559 Vitamin D deficiency, unspecified: Secondary | ICD-10-CM | POA: Diagnosis not present

## 2022-09-09 DIAGNOSIS — E669 Obesity, unspecified: Secondary | ICD-10-CM | POA: Diagnosis not present

## 2022-09-09 DIAGNOSIS — G4709 Other insomnia: Secondary | ICD-10-CM | POA: Diagnosis not present

## 2022-09-09 DIAGNOSIS — Z794 Long term (current) use of insulin: Secondary | ICD-10-CM

## 2022-09-09 DIAGNOSIS — Z6837 Body mass index (BMI) 37.0-37.9, adult: Secondary | ICD-10-CM

## 2022-09-11 NOTE — Progress Notes (Signed)
Chief Complaint:   OBESITY Cynthia Short is here to discuss her progress with her obesity treatment plan along with follow-up of her obesity related diagnoses. Cynthia Short is on the Category 2 Plan and states she is following her eating plan approximately 70% of the time. Cynthia Short states she is walking for 30 minutes 1 time per week.  Today's visit was #: 10 Starting weight: 225 lbs Starting date: 11/26/2021 Today's weight: 209 lbs Today's date: 09/09/2022 Total lbs lost to date: 16 Total lbs lost since last in-office visit: 0  Interim History: Aaralyn is doing well overall with weight loss. She reports some nausea and vomiting when she eats more or increases carbohydrates. Possible GI bug, but possibly due to GLP-1. We discussed strategies to decrease simple carbohydrates.   Subjective:   1. Type 2 diabetes mellitus with other specified complication, with long-term current use of insulin (HCC) Cynthia Short's last A1c was 6.3 in June. She notes some nausea and vomiting when she increases simple carbohydrates.   2. Vitamin D deficiency Cynthia Short's last Vitamin D level was 25.5, and she is tolerating Vitamin D with no side effects noted.   3. Other insomnia Cynthia Short stopped HRT at 6-8 months ago due to her sister with ER breast cancer. We discussed simple carbohydrates may worsen hot flashes, and discussed the use of SSRI to help with hot flashes, decreased sleep, etc.   Assessment/Plan:   1. Type 2 diabetes mellitus with other specified complication, with long-term current use of insulin (HCC) Cynthia Short will continue Ozempic and metformin, and continue with her diet and exercise. We discussed strategies to avoid simple carbohydrates and encouragement was provided.   2. Vitamin D deficiency Cynthia Short will continue prescription Vitamin D 50,000 IU every week. We will recheck labs at her next visit, and she will follow-up for routine testing of Vitamin D, at least 2-3 times per year to avoid over-replacement.  3. Other  insomnia Cynthia Short would like to try to decrease simple carbohydrates first.   4. Obesity, Current BMI 37.0 Cynthia Short is currently in the action stage of change. As such, her goal is to continue with weight loss efforts. She has agreed to the Category 2 Plan.   We discussed avoiding simple carbohydrates and hidden carbohydrates. She will continue her diet and exercise.   Exercise goals: As is.   Behavioral modification strategies: increasing lean protein intake, decreasing simple carbohydrates, increasing water intake, meal planning and cooking strategies, better snacking choices, and planning for success.  Cynthia Short has agreed to follow-up with our clinic in 4 weeks. She was informed of the importance of frequent follow-up visits to maximize her success with intensive lifestyle modifications for her multiple health conditions.   Objective:   Blood pressure 127/78, pulse 69, temperature 98.1 F (36.7 C), height '5\' 3"'$  (1.6 m), weight 209 lb (94.8 kg), SpO2 97 %. Body mass index is 37.02 kg/m.  General: Cooperative, alert, well developed, in no acute distress. HEENT: Conjunctivae and lids unremarkable. Cardiovascular: Regular rhythm.  Lungs: Normal work of breathing. Neurologic: No focal deficits.   Lab Results  Component Value Date   CREATININE 0.70 05/01/2022   BUN 9 05/01/2022   NA 142 05/01/2022   K 4.9 05/01/2022   CL 101 05/01/2022   CO2 26 05/01/2022   Lab Results  Component Value Date   ALT 27 05/01/2022   AST 24 05/01/2022   ALKPHOS 85 05/01/2022   BILITOT 0.3 05/01/2022   Lab Results  Component Value Date   HGBA1C  6.6 (H) 11/26/2021   HGBA1C 6.7 (H) 05/07/2021   HGBA1C 7.0 12/04/2020   HGBA1C 6.7 06/08/2020   HGBA1C 6.5 (H) 03/15/2020   Lab Results  Component Value Date   INSULIN 19.1 05/01/2022   INSULIN 37.5 (H) 05/07/2021   INSULIN 24.6 03/15/2020   INSULIN 20.8 10/31/2019   INSULIN 18.5 02/15/2019   Lab Results  Component Value Date   TSH 1.880 11/26/2021    Lab Results  Component Value Date   CHOL 162 11/26/2021   HDL 42 11/26/2021   LDLCALC 101 (H) 11/26/2021   TRIG 106 11/26/2021   CHOLHDL 3.9 11/26/2021   Lab Results  Component Value Date   VD25OH 25.5 (L) 05/01/2022   VD25OH 51.2 11/26/2021   VD25OH 42.4 05/07/2021   Lab Results  Component Value Date   WBC 7.7 11/26/2021   HGB 12.2 11/26/2021   HCT 38.1 11/26/2021   MCV 86 11/26/2021   PLT 300 11/26/2021   No results found for: "IRON", "TIBC", "FERRITIN"  Attestation Statements:   Reviewed by clinician on day of visit: allergies, medications, problem list, medical history, surgical history, family history, social history, and previous encounter notes.  Time spent on visit including pre-visit chart review and post-visit care and charting was 30 minutes.   I, Trixie Dredge, am acting as transcriptionist for Dennard Nip, MD.  I have reviewed the above documentation for accuracy and completeness, and I agree with the above. -  Dennard Nip, MD

## 2022-09-23 DIAGNOSIS — L821 Other seborrheic keratosis: Secondary | ICD-10-CM | POA: Diagnosis not present

## 2022-09-23 DIAGNOSIS — D225 Melanocytic nevi of trunk: Secondary | ICD-10-CM | POA: Diagnosis not present

## 2022-09-23 DIAGNOSIS — L578 Other skin changes due to chronic exposure to nonionizing radiation: Secondary | ICD-10-CM | POA: Diagnosis not present

## 2022-09-25 DIAGNOSIS — G720 Drug-induced myopathy: Secondary | ICD-10-CM | POA: Diagnosis not present

## 2022-09-25 DIAGNOSIS — R471 Dysarthria and anarthria: Secondary | ICD-10-CM | POA: Diagnosis not present

## 2022-09-25 DIAGNOSIS — R300 Dysuria: Secondary | ICD-10-CM | POA: Diagnosis not present

## 2022-09-25 DIAGNOSIS — R519 Headache, unspecified: Secondary | ICD-10-CM | POA: Diagnosis not present

## 2022-09-25 DIAGNOSIS — N3000 Acute cystitis without hematuria: Secondary | ICD-10-CM | POA: Diagnosis not present

## 2022-10-07 ENCOUNTER — Telehealth (INDEPENDENT_AMBULATORY_CARE_PROVIDER_SITE_OTHER): Payer: BC Managed Care – PPO | Admitting: Family Medicine

## 2022-10-15 DIAGNOSIS — E1129 Type 2 diabetes mellitus with other diabetic kidney complication: Secondary | ICD-10-CM | POA: Diagnosis not present

## 2022-10-15 DIAGNOSIS — J45909 Unspecified asthma, uncomplicated: Secondary | ICD-10-CM | POA: Diagnosis not present

## 2022-10-15 DIAGNOSIS — J019 Acute sinusitis, unspecified: Secondary | ICD-10-CM | POA: Diagnosis not present

## 2022-10-15 DIAGNOSIS — J069 Acute upper respiratory infection, unspecified: Secondary | ICD-10-CM | POA: Diagnosis not present

## 2022-10-17 ENCOUNTER — Other Ambulatory Visit (INDEPENDENT_AMBULATORY_CARE_PROVIDER_SITE_OTHER): Payer: Self-pay | Admitting: Family Medicine

## 2022-10-17 DIAGNOSIS — E1169 Type 2 diabetes mellitus with other specified complication: Secondary | ICD-10-CM

## 2022-11-11 ENCOUNTER — Ambulatory Visit (INDEPENDENT_AMBULATORY_CARE_PROVIDER_SITE_OTHER): Payer: Medicare Other | Admitting: Family Medicine

## 2022-11-11 ENCOUNTER — Encounter (INDEPENDENT_AMBULATORY_CARE_PROVIDER_SITE_OTHER): Payer: Self-pay | Admitting: Family Medicine

## 2022-11-11 VITALS — BP 125/78 | HR 69 | Temp 98.0°F | Ht 63.0 in | Wt 208.4 lb

## 2022-11-11 DIAGNOSIS — Z7985 Long-term (current) use of injectable non-insulin antidiabetic drugs: Secondary | ICD-10-CM

## 2022-11-11 DIAGNOSIS — I10 Essential (primary) hypertension: Secondary | ICD-10-CM

## 2022-11-11 DIAGNOSIS — E669 Obesity, unspecified: Secondary | ICD-10-CM

## 2022-11-11 DIAGNOSIS — Z6836 Body mass index (BMI) 36.0-36.9, adult: Secondary | ICD-10-CM | POA: Diagnosis not present

## 2022-11-11 DIAGNOSIS — E1169 Type 2 diabetes mellitus with other specified complication: Secondary | ICD-10-CM | POA: Diagnosis not present

## 2022-11-19 NOTE — Progress Notes (Signed)
Chief Complaint:   OBESITY Cynthia Short is here to discuss her progress with her obesity treatment plan along with follow-up of her obesity related diagnoses. Cynthia Short is on the Category 2 Plan and states she is following her eating plan approximately 65-70% of the time. Cynthia Short states she is walking for 20 minutes 2 times per week.  Today's visit was #: 11 Starting weight: 225 lbs Starting date: 11/26/2021 Today's weight: 208 lbs Today's date: 11/11/2022 Total lbs lost to date: 17 Total lbs lost since last in-office visit: 1  Interim History: Cynthia Short has done very well with weight loss even over Thanksgiving.  She was able to restart her GLP-1, but she has been mindful of her choices overall.  Subjective:   1. Essential hypertension Cynthia Short's blood pressure is stable on her medications.  2. Type 2 diabetes mellitus with other specified complication, without long-term current use of insulin (Cynthia Short) Cynthia Short was out of Ozempic and she restarted at 0.5 mg.  She notes some increase in GI upset, but she feels this is starting to get back on track.  Assessment/Plan:   1. Essential hypertension Cynthia Short will continue her medications, diet, and exercise to improve blood pressure control. We will watch for signs of hypotension as she continues her lifestyle modifications.  2. Type 2 diabetes mellitus with other specified complication, without long-term current use of insulin (Cynthia Short) Cynthia Short will continue Ozempic at 0.5 mg once weekly, and we will follow-up at her next visit.  3. Obesity, Current BMI 36.9 Cynthia Short is currently in the action stage of change. As such, her goal is to continue with weight loss efforts. She has agreed to the Category 2 Plan.   Exercise goals: As is.   Behavioral modification strategies: increasing lean protein intake and holiday eating strategies .  Cynthia Short has agreed to follow-up with our clinic in 3 to 4 weeks. She was informed of the importance of frequent follow-up visits to  maximize her success with intensive lifestyle modifications for her multiple health conditions.   Objective:   Blood pressure 125/78, pulse 69, temperature 98 F (36.7 C), height '5\' 3"'$  (1.6 m), weight 208 lb 6.4 oz (94.5 kg), SpO2 99 %. Body mass index is 36.92 kg/m.  General: Cooperative, alert, well developed, in no acute distress. HEENT: Conjunctivae and lids unremarkable. Cardiovascular: Regular rhythm.  Lungs: Normal work of breathing. Neurologic: No focal deficits.   Lab Results  Component Value Date   CREATININE 0.70 05/01/2022   BUN 9 05/01/2022   NA 142 05/01/2022   K 4.9 05/01/2022   CL 101 05/01/2022   CO2 26 05/01/2022   Lab Results  Component Value Date   ALT 27 05/01/2022   AST 24 05/01/2022   ALKPHOS 85 05/01/2022   BILITOT 0.3 05/01/2022   Lab Results  Component Value Date   HGBA1C 6.6 (H) 11/26/2021   HGBA1C 6.7 (H) 05/07/2021   HGBA1C 7.0 12/04/2020   HGBA1C 6.7 06/08/2020   HGBA1C 6.5 (H) 03/15/2020   Lab Results  Component Value Date   INSULIN 19.1 05/01/2022   INSULIN 37.5 (H) 05/07/2021   INSULIN 24.6 03/15/2020   INSULIN 20.8 10/31/2019   INSULIN 18.5 02/15/2019   Lab Results  Component Value Date   TSH 1.880 11/26/2021   Lab Results  Component Value Date   CHOL 162 11/26/2021   HDL 42 11/26/2021   LDLCALC 101 (H) 11/26/2021   TRIG 106 11/26/2021   CHOLHDL 3.9 11/26/2021   Lab Results  Component Value  Date   VD25OH 25.5 (L) 05/01/2022   VD25OH 51.2 11/26/2021   VD25OH 42.4 05/07/2021   Lab Results  Component Value Date   WBC 7.7 11/26/2021   HGB 12.2 11/26/2021   HCT 38.1 11/26/2021   MCV 86 11/26/2021   PLT 300 11/26/2021   No results found for: "IRON", "TIBC", "FERRITIN"  Attestation Statements:   Reviewed by clinician on day of visit: allergies, medications, problem list, medical history, surgical history, family history, social history, and previous encounter notes.   I, Trixie Dredge, am acting as  transcriptionist for Dennard Nip, MD.  I have reviewed the above documentation for accuracy and completeness, and I agree with the above. -  Dennard Nip, MD

## 2022-12-03 ENCOUNTER — Ambulatory Visit (INDEPENDENT_AMBULATORY_CARE_PROVIDER_SITE_OTHER): Payer: BC Managed Care – PPO | Admitting: Family Medicine

## 2022-12-23 ENCOUNTER — Ambulatory Visit (INDEPENDENT_AMBULATORY_CARE_PROVIDER_SITE_OTHER): Payer: BC Managed Care – PPO | Admitting: Family Medicine

## 2023-01-06 DIAGNOSIS — L089 Local infection of the skin and subcutaneous tissue, unspecified: Secondary | ICD-10-CM | POA: Diagnosis not present

## 2023-01-06 DIAGNOSIS — S61210A Laceration without foreign body of right index finger without damage to nail, initial encounter: Secondary | ICD-10-CM | POA: Diagnosis not present

## 2023-01-06 DIAGNOSIS — Z23 Encounter for immunization: Secondary | ICD-10-CM | POA: Diagnosis not present

## 2023-01-15 ENCOUNTER — Ambulatory Visit (INDEPENDENT_AMBULATORY_CARE_PROVIDER_SITE_OTHER): Payer: BC Managed Care – PPO | Admitting: Family Medicine

## 2023-02-10 ENCOUNTER — Encounter (INDEPENDENT_AMBULATORY_CARE_PROVIDER_SITE_OTHER): Payer: Self-pay | Admitting: Family Medicine

## 2023-02-10 ENCOUNTER — Ambulatory Visit (INDEPENDENT_AMBULATORY_CARE_PROVIDER_SITE_OTHER): Payer: Medicare Other | Admitting: Family Medicine

## 2023-02-10 VITALS — BP 134/74 | HR 72 | Temp 98.2°F | Ht 63.0 in | Wt 208.0 lb

## 2023-02-10 DIAGNOSIS — E559 Vitamin D deficiency, unspecified: Secondary | ICD-10-CM

## 2023-02-10 DIAGNOSIS — Z7985 Long-term (current) use of injectable non-insulin antidiabetic drugs: Secondary | ICD-10-CM | POA: Diagnosis not present

## 2023-02-10 DIAGNOSIS — E538 Deficiency of other specified B group vitamins: Secondary | ICD-10-CM | POA: Diagnosis not present

## 2023-02-10 DIAGNOSIS — Z6839 Body mass index (BMI) 39.0-39.9, adult: Secondary | ICD-10-CM | POA: Insufficient documentation

## 2023-02-10 DIAGNOSIS — Z6836 Body mass index (BMI) 36.0-36.9, adult: Secondary | ICD-10-CM | POA: Insufficient documentation

## 2023-02-10 DIAGNOSIS — E1169 Type 2 diabetes mellitus with other specified complication: Secondary | ICD-10-CM

## 2023-02-10 DIAGNOSIS — Z794 Long term (current) use of insulin: Secondary | ICD-10-CM | POA: Diagnosis not present

## 2023-02-10 DIAGNOSIS — R059 Cough, unspecified: Secondary | ICD-10-CM | POA: Insufficient documentation

## 2023-02-10 DIAGNOSIS — R058 Other specified cough: Secondary | ICD-10-CM

## 2023-02-10 DIAGNOSIS — Z7984 Long term (current) use of oral hypoglycemic drugs: Secondary | ICD-10-CM | POA: Diagnosis not present

## 2023-02-10 DIAGNOSIS — E669 Obesity, unspecified: Secondary | ICD-10-CM

## 2023-02-10 DIAGNOSIS — Z6838 Body mass index (BMI) 38.0-38.9, adult: Secondary | ICD-10-CM | POA: Insufficient documentation

## 2023-02-10 MED ORDER — VITAMIN D (ERGOCALCIFEROL) 1.25 MG (50000 UNIT) PO CAPS: 50000.0000 [IU] | ORAL_CAPSULE | ORAL | 1 refills | Status: DC

## 2023-02-10 MED ORDER — OZEMPIC (0.25 OR 0.5 MG/DOSE) 2 MG/3ML ~~LOC~~ SOPN: 0.5000 mg | PEN_INJECTOR | SUBCUTANEOUS | 0 refills | Status: DC

## 2023-02-10 MED ORDER — CYANOCOBALAMIN 1000 MCG PO TABS: 2000.0000 ug | ORAL_TABLET | Freq: Every day | ORAL | 0 refills | Status: DC

## 2023-02-10 MED ORDER — METFORMIN HCL ER 500 MG PO TB24: 500.0000 mg | ORAL_TABLET | Freq: Two times a day (BID) | ORAL | 0 refills | Status: DC

## 2023-02-21 ENCOUNTER — Other Ambulatory Visit (INDEPENDENT_AMBULATORY_CARE_PROVIDER_SITE_OTHER): Payer: Self-pay | Admitting: Family Medicine

## 2023-02-21 DIAGNOSIS — E538 Deficiency of other specified B group vitamins: Secondary | ICD-10-CM

## 2023-02-27 DIAGNOSIS — N3090 Cystitis, unspecified without hematuria: Secondary | ICD-10-CM | POA: Diagnosis not present

## 2023-02-27 DIAGNOSIS — E119 Type 2 diabetes mellitus without complications: Secondary | ICD-10-CM | POA: Diagnosis not present

## 2023-03-02 NOTE — Progress Notes (Signed)
Chief Complaint:   OBESITY Cynthia Short is here to discuss her progress with her obesity treatment plan along with follow-up of her obesity related diagnoses. Cynthia Short is on the Category 2 Plan and states she is following her eating plan approximately 0% of the time. Cynthia Short states she is doing 0 minutes 0 times per week.  Today's visit was #: 12 Starting weight: 225 lbs Starting date: 11/26/2021 Today's weight: 208 lbs Today's date: 02/10/2023 Total lbs lost to date: 17 Total lbs lost since last in-office visit: 0  Interim History: Cynthia Short's last visit was 3 months ago and she has done well with maintaining the weight she has lost.  Her husband has had a serious of health scares and she was concentrating on taking care of him.  She is ready to work on her weight loss again now that things have settled down.  Subjective:   1. Vitamin D deficiency Cynthia Short is on Vitamin D prescription, and she needs a refill today.   2. Type 2 diabetes mellitus with other specified complication, with long-term current use of insulin (HCC) Cynthia Short is on Ozempic and metformin. She was off Ozempic for 2 months but she recently restarted at 0.25 mg once weekly.   3. Vitamin B 12 deficiency Cynthia Short has been out of B12, and she requests a refill. She is due for labs soon.   4. Other cough Cynthia Short has some signs of congestion and a tickling cough with multiple sick family members recently. She denies fever and COVID was negative.   Assessment/Plan:   1. Vitamin D deficiency Cynthia Short will continue prescription vitamin D, and we will refill for 2 months.  We will recheck labs in 1 month.  - Vitamin D, Ergocalciferol, (DRISDOL) 1.25 MG (50000 UNIT) CAPS capsule; Take 1 capsule (50,000 Units total) by mouth every 7 (seven) days.  Dispense: 4 capsule; Refill: 1  2. Type 2 diabetes mellitus with other specified complication, with long-term current use of insulin (HCC) Cynthia Short will continue her medications, and we will refill  metformin and Ozempic for 1 month.  We will recheck labs in 1 month.  - metFORMIN (GLUCOPHAGE-XR) 500 MG 24 hr tablet; Take 1 tablet (500 mg total) by mouth 2 (two) times daily with a meal.  Dispense: 60 tablet; Refill: 0 - Semaglutide,0.25 or 0.5MG /DOS, (OZEMPIC, 0.25 OR 0.5 MG/DOSE,) 2 MG/3ML SOPN; Inject 0.5 mg into the skin once a week.  Dispense: 3 mL; Refill: 0  3. Vitamin B 12 deficiency Cynthia Short will continue B12 supplementation, and we will refill for 1 month.  We will recheck labs in 1 month.  - cyanocobalamin ([facility] VITAMIN B12) 1000 MCG tablet; Take 2 tablets (2,000 mcg total) by mouth daily.  Dispense: 60 tablet; Refill: 0  4. Other cough Symptomatic treatment recommended was Delsym and Zyrtec-D OTC.  Cynthia Short should follow-up with her PCP if symptoms worsen or fail to improve.  5. BMI 36.0-36.9,adult  6. Obesity, Beginning BMI 39.86 Cynthia Short is currently in the action stage of change. As such, her goal is to continue with weight loss efforts. She has agreed to the Category 2 Plan.   Behavioral modification strategies: increasing lean protein intake.  Cynthia Short has agreed to follow-up with our clinic in 4 weeks. She was informed of the importance of frequent follow-up visits to maximize her success with intensive lifestyle modifications for her multiple health conditions.   Objective:   Blood pressure 134/74, pulse 72, temperature 98.2 F (36.8 C), height 5\' 3"  (1.6 m), weight  208 lb (94.3 kg), SpO2 98 %. Body mass index is 36.85 kg/m.  Lab Results  Component Value Date   CREATININE 0.70 05/01/2022   BUN 9 05/01/2022   NA 142 05/01/2022   K 4.9 05/01/2022   CL 101 05/01/2022   CO2 26 05/01/2022   Lab Results  Component Value Date   ALT 27 05/01/2022   AST 24 05/01/2022   ALKPHOS 85 05/01/2022   BILITOT 0.3 05/01/2022   Lab Results  Component Value Date   HGBA1C 6.6 (H) 11/26/2021   HGBA1C 6.7 (H) 05/07/2021   HGBA1C 7.0 12/04/2020   HGBA1C 6.7 06/08/2020   HGBA1C 6.5  (H) 03/15/2020   Lab Results  Component Value Date   INSULIN 19.1 05/01/2022   INSULIN 37.5 (H) 05/07/2021   INSULIN 24.6 03/15/2020   INSULIN 20.8 10/31/2019   INSULIN 18.5 02/15/2019   Lab Results  Component Value Date   TSH 1.880 11/26/2021   Lab Results  Component Value Date   CHOL 162 11/26/2021   HDL 42 11/26/2021   LDLCALC 101 (H) 11/26/2021   TRIG 106 11/26/2021   CHOLHDL 3.9 11/26/2021   Lab Results  Component Value Date   VD25OH 25.5 (L) 05/01/2022   VD25OH 51.2 11/26/2021   VD25OH 42.4 05/07/2021   Lab Results  Component Value Date   WBC 7.7 11/26/2021   HGB 12.2 11/26/2021   HCT 38.1 11/26/2021   MCV 86 11/26/2021   PLT 300 11/26/2021   No results found for: "IRON", "TIBC", "FERRITIN"  Attestation Statements:   Reviewed by clinician on day of visit: allergies, medications, problem list, medical history, surgical history, family history, social history, and previous encounter notes.   I, Cynthia Short Dredge, am acting as transcriptionist for Dennard Nip, MD.  I have reviewed the above documentation for accuracy and completeness, and I agree with the above. -  Dennard Nip, MD

## 2023-03-04 DIAGNOSIS — R319 Hematuria, unspecified: Secondary | ICD-10-CM | POA: Diagnosis not present

## 2023-03-05 DIAGNOSIS — E119 Type 2 diabetes mellitus without complications: Secondary | ICD-10-CM | POA: Diagnosis not present

## 2023-03-05 DIAGNOSIS — R809 Proteinuria, unspecified: Secondary | ICD-10-CM | POA: Diagnosis not present

## 2023-03-05 DIAGNOSIS — E039 Hypothyroidism, unspecified: Secondary | ICD-10-CM | POA: Diagnosis not present

## 2023-03-05 DIAGNOSIS — Z1159 Encounter for screening for other viral diseases: Secondary | ICD-10-CM | POA: Diagnosis not present

## 2023-03-05 DIAGNOSIS — K219 Gastro-esophageal reflux disease without esophagitis: Secondary | ICD-10-CM | POA: Diagnosis not present

## 2023-03-05 DIAGNOSIS — E7800 Pure hypercholesterolemia, unspecified: Secondary | ICD-10-CM | POA: Diagnosis not present

## 2023-03-05 DIAGNOSIS — J45909 Unspecified asthma, uncomplicated: Secondary | ICD-10-CM | POA: Diagnosis not present

## 2023-03-05 DIAGNOSIS — E559 Vitamin D deficiency, unspecified: Secondary | ICD-10-CM | POA: Diagnosis not present

## 2023-03-05 DIAGNOSIS — E1129 Type 2 diabetes mellitus with other diabetic kidney complication: Secondary | ICD-10-CM | POA: Diagnosis not present

## 2023-03-05 DIAGNOSIS — I779 Disorder of arteries and arterioles, unspecified: Secondary | ICD-10-CM | POA: Diagnosis not present

## 2023-03-05 DIAGNOSIS — I10 Essential (primary) hypertension: Secondary | ICD-10-CM | POA: Diagnosis not present

## 2023-03-05 DIAGNOSIS — I341 Nonrheumatic mitral (valve) prolapse: Secondary | ICD-10-CM | POA: Diagnosis not present

## 2023-03-05 DIAGNOSIS — E538 Deficiency of other specified B group vitamins: Secondary | ICD-10-CM | POA: Diagnosis not present

## 2023-03-05 DIAGNOSIS — Z0000 Encounter for general adult medical examination without abnormal findings: Secondary | ICD-10-CM | POA: Diagnosis not present

## 2023-03-12 ENCOUNTER — Ambulatory Visit (INDEPENDENT_AMBULATORY_CARE_PROVIDER_SITE_OTHER): Payer: BC Managed Care – PPO | Admitting: Family Medicine

## 2023-03-12 ENCOUNTER — Encounter (INDEPENDENT_AMBULATORY_CARE_PROVIDER_SITE_OTHER): Payer: Self-pay | Admitting: Family Medicine

## 2023-03-12 VITALS — BP 153/76 | HR 64 | Temp 98.2°F | Ht 63.0 in | Wt 211.0 lb

## 2023-03-12 DIAGNOSIS — Z794 Long term (current) use of insulin: Secondary | ICD-10-CM | POA: Diagnosis not present

## 2023-03-12 DIAGNOSIS — E559 Vitamin D deficiency, unspecified: Secondary | ICD-10-CM | POA: Diagnosis not present

## 2023-03-12 DIAGNOSIS — E1169 Type 2 diabetes mellitus with other specified complication: Secondary | ICD-10-CM

## 2023-03-12 DIAGNOSIS — Z7985 Long-term (current) use of injectable non-insulin antidiabetic drugs: Secondary | ICD-10-CM | POA: Diagnosis not present

## 2023-03-12 DIAGNOSIS — Z6837 Body mass index (BMI) 37.0-37.9, adult: Secondary | ICD-10-CM

## 2023-03-12 DIAGNOSIS — E669 Obesity, unspecified: Secondary | ICD-10-CM | POA: Diagnosis not present

## 2023-03-12 DIAGNOSIS — K5909 Other constipation: Secondary | ICD-10-CM | POA: Diagnosis not present

## 2023-03-12 MED ORDER — VITAMIN D (ERGOCALCIFEROL) 1.25 MG (50000 UNIT) PO CAPS: 50000.0000 [IU] | ORAL_CAPSULE | ORAL | 0 refills | Status: DC

## 2023-03-12 MED ORDER — OZEMPIC (0.25 OR 0.5 MG/DOSE) 2 MG/3ML ~~LOC~~ SOPN: 0.5000 mg | PEN_INJECTOR | SUBCUTANEOUS | 0 refills | Status: DC

## 2023-03-16 NOTE — Progress Notes (Signed)
Chief Complaint:   OBESITY Cynthia Short is here to discuss her progress with her obesity treatment plan along with follow-up of her obesity related diagnoses. Cynthia Short is on the Category 2 Plan and states she is following her eating plan approximately 50% of the time. Cynthia Short states she is doing 0 minutes 0 times per week.  Today's visit was #: 16 Starting weight: 225 lbs Starting date: 11/26/2021 Today's weight: 211 lbs Today's date: 03/12/2023 Total lbs lost to date: 14 Total lbs lost since last in-office visit: 0  Interim History: Cynthia Short has been dealing with increased stress from health issues from herself and her husband.  She is retaining some fluid today.  Subjective:   1. Type 2 diabetes mellitus with other specified complication, with long-term current use of insulin (HCC) Cynthia Short's A1c at her PCP's office was 6.6 per the patient.  She has been off her Ozempic for a couple of weeks.  2. Other constipation Cynthia Short notes increased constipation while on Ozempic.  3. Vitamin D deficiency Cynthia Short is on vitamin D prescription, with no side effects noted.  Assessment/Plan:   1. Type 2 diabetes mellitus with other specified complication, with long-term current use of insulin (Cynthia Short) Cynthia Short will continue Ozempic, and we will refill for 1 month.  - Semaglutide,0.25 or 0.5MG /DOS, (OZEMPIC, 0.25 OR 0.5 MG/DOSE,) 2 MG/3ML SOPN; Inject 0.5 mg into the skin once a week.  Dispense: 3 mL; Refill: 0  2. Other constipation Cynthia Short agreed to start OTC MiraLAX 17 g daily, and she will work on increasing her water intake.  3. Vitamin D deficiency We will refill prescription vitamin D for 1 month, and will obtain labs from her PCP.  - Vitamin D, Ergocalciferol, (DRISDOL) 1.25 MG (50000 UNIT) CAPS capsule; Take 1 capsule (50,000 Units total) by mouth every 7 (seven) days.  Dispense: 4 capsule; Refill: 0  4. BMI 37.0-37.9, adult  5. Obesity, Beginning BMI 39.86 Cynthia Short is currently in the action stage of  change. As such, her goal is to continue with weight loss efforts. She has agreed to keeping a food journal and adhering to recommended goals of 1200-1400 calories and 75+ grams of protein daily.   Behavioral modification strategies: increasing lean protein intake.  Cynthia Short has agreed to follow-up with our clinic in 4 weeks. She was informed of the importance of frequent follow-up visits to maximize her success with intensive lifestyle modifications for her multiple health conditions.   Objective:   Blood pressure (!) 153/76, pulse 64, temperature 98.2 F (36.8 C), height 5\' 3"  (1.6 m), weight 211 lb (95.7 kg), SpO2 95 %. Body mass index is 37.38 kg/m.  Lab Results  Component Value Date   CREATININE 0.70 05/01/2022   BUN 9 05/01/2022   NA 142 05/01/2022   K 4.9 05/01/2022   CL 101 05/01/2022   CO2 26 05/01/2022   Lab Results  Component Value Date   ALT 27 05/01/2022   AST 24 05/01/2022   ALKPHOS 85 05/01/2022   BILITOT 0.3 05/01/2022   Lab Results  Component Value Date   HGBA1C 6.6 (H) 11/26/2021   HGBA1C 6.7 (H) 05/07/2021   HGBA1C 7.0 12/04/2020   HGBA1C 6.7 06/08/2020   HGBA1C 6.5 (H) 03/15/2020   Lab Results  Component Value Date   INSULIN 19.1 05/01/2022   INSULIN 37.5 (H) 05/07/2021   INSULIN 24.6 03/15/2020   INSULIN 20.8 10/31/2019   INSULIN 18.5 02/15/2019   Lab Results  Component Value Date   TSH 1.880  11/26/2021   Lab Results  Component Value Date   CHOL 162 11/26/2021   HDL 42 11/26/2021   LDLCALC 101 (H) 11/26/2021   TRIG 106 11/26/2021   CHOLHDL 3.9 11/26/2021   Lab Results  Component Value Date   VD25OH 25.5 (L) 05/01/2022   VD25OH 51.2 11/26/2021   VD25OH 42.4 05/07/2021   Lab Results  Component Value Date   WBC 7.7 11/26/2021   HGB 12.2 11/26/2021   HCT 38.1 11/26/2021   MCV 86 11/26/2021   PLT 300 11/26/2021   No results found for: "IRON", "TIBC", "FERRITIN"  Attestation Statements:   Reviewed by clinician on day of visit:  allergies, medications, problem list, medical history, surgical history, family history, social history, and previous encounter notes.   I, Trixie Dredge, am acting as transcriptionist for Dennard Nip, MD.  I have reviewed the above documentation for accuracy and completeness, and I agree with the above. -  Dennard Nip, MD

## 2023-04-09 ENCOUNTER — Ambulatory Visit (INDEPENDENT_AMBULATORY_CARE_PROVIDER_SITE_OTHER): Payer: BC Managed Care – PPO | Admitting: Family Medicine

## 2023-04-29 ENCOUNTER — Ambulatory Visit (INDEPENDENT_AMBULATORY_CARE_PROVIDER_SITE_OTHER): Payer: BC Managed Care – PPO | Admitting: Family Medicine

## 2023-04-30 ENCOUNTER — Ambulatory Visit (INDEPENDENT_AMBULATORY_CARE_PROVIDER_SITE_OTHER): Payer: Medicare Other | Admitting: Family Medicine

## 2023-04-30 VITALS — BP 130/82 | HR 63 | Temp 98.1°F | Ht 63.0 in | Wt 214.0 lb

## 2023-04-30 DIAGNOSIS — Z794 Long term (current) use of insulin: Secondary | ICD-10-CM | POA: Diagnosis not present

## 2023-04-30 DIAGNOSIS — Z6837 Body mass index (BMI) 37.0-37.9, adult: Secondary | ICD-10-CM | POA: Diagnosis not present

## 2023-04-30 DIAGNOSIS — Z7984 Long term (current) use of oral hypoglycemic drugs: Secondary | ICD-10-CM

## 2023-04-30 DIAGNOSIS — E1169 Type 2 diabetes mellitus with other specified complication: Secondary | ICD-10-CM

## 2023-04-30 DIAGNOSIS — E669 Obesity, unspecified: Secondary | ICD-10-CM | POA: Diagnosis not present

## 2023-04-30 DIAGNOSIS — Z6838 Body mass index (BMI) 38.0-38.9, adult: Secondary | ICD-10-CM

## 2023-05-14 DIAGNOSIS — L814 Other melanin hyperpigmentation: Secondary | ICD-10-CM | POA: Diagnosis not present

## 2023-05-14 DIAGNOSIS — L821 Other seborrheic keratosis: Secondary | ICD-10-CM | POA: Diagnosis not present

## 2023-05-19 NOTE — Progress Notes (Unsigned)
Chief Complaint:   OBESITY Cynthia Short is here to discuss her progress with her obesity treatment plan along with follow-up of her obesity related diagnoses. Cynthia Short is on keeping a food journal and adhering to recommended goals of 1200-1400 calories and 75+ grams of protein and states she is following her eating plan approximately 0% of the time. Cynthia Short states she is doing 0 minutes 0 times per week.  Today's visit was #: 14 Starting weight: 225 lbs Starting date: 11/26/2021 Today's weight: 214 lbs Today's date: 04/30/2023 Total lbs lost to date: 11 Total lbs lost since last in-office visit: 0  Interim History: Patient is going through a lot of changes.  She will be planning on selling her home and moving soon.  She has not been able to concentrate on weight loss and she may be increasing stress eating.  Subjective:   1. Type 2 diabetes mellitus with other specified complication, with long-term current use of insulin (HCC) Patient decided to stop Ozempic due to having concerns about potential side effects.  Her A1c at her PCP office was elevated at 6.7.  She continues on metformin.  Assessment/Plan:   1. Type 2 diabetes mellitus with other specified complication, with long-term current use of insulin (HCC) Patient is to work on decreasing simple carbohydrates and increased her exercise to help control her glucose and A1c.  She will continue metformin XR 500 mg twice daily, and we will continue to follow.  2. BMI 38.0-38.9,adult  3. Obesity, Beginning BMI 39.86 Cynthia Short is currently in the action stage of change. As such, her goal is to maintain weight for now. She has agreed to practicing portion control and making smarter food choices, such as increasing vegetables and decreasing simple carbohydrates.   Patient's goal is to avoid weight gain while she is unable to follow the plan.  Behavioral modification strategies: increasing lean protein intake and better snacking choices.  Trivia  has agreed to follow-up with our clinic in 4 weeks. She was informed of the importance of frequent follow-up visits to maximize her success with intensive lifestyle modifications for her multiple health conditions.   Objective:   Blood pressure 130/82, pulse 63, temperature 98.1 F (36.7 C), height 5\' 3"  (1.6 m), weight 214 lb (97.1 kg), SpO2 97 %. Body mass index is 37.91 kg/m.  Lab Results  Component Value Date   CREATININE 0.70 05/01/2022   BUN 9 05/01/2022   NA 142 05/01/2022   K 4.9 05/01/2022   CL 101 05/01/2022   CO2 26 05/01/2022   Lab Results  Component Value Date   ALT 27 05/01/2022   AST 24 05/01/2022   ALKPHOS 85 05/01/2022   BILITOT 0.3 05/01/2022   Lab Results  Component Value Date   HGBA1C 6.6 (H) 11/26/2021   HGBA1C 6.7 (H) 05/07/2021   HGBA1C 7.0 12/04/2020   HGBA1C 6.7 06/08/2020   HGBA1C 6.5 (H) 03/15/2020   Lab Results  Component Value Date   INSULIN 19.1 05/01/2022   INSULIN 37.5 (H) 05/07/2021   INSULIN 24.6 03/15/2020   INSULIN 20.8 10/31/2019   INSULIN 18.5 02/15/2019   Lab Results  Component Value Date   TSH 1.880 11/26/2021   Lab Results  Component Value Date   CHOL 162 11/26/2021   HDL 42 11/26/2021   LDLCALC 101 (H) 11/26/2021   TRIG 106 11/26/2021   CHOLHDL 3.9 11/26/2021   Lab Results  Component Value Date   VD25OH 25.5 (L) 05/01/2022   VD25OH 51.2 11/26/2021  VD25OH 42.4 05/07/2021   Lab Results  Component Value Date   WBC 7.7 11/26/2021   HGB 12.2 11/26/2021   HCT 38.1 11/26/2021   MCV 86 11/26/2021   PLT 300 11/26/2021   No results found for: "IRON", "TIBC", "FERRITIN"  Attestation Statements:   Reviewed by clinician on day of visit: allergies, medications, problem list, medical history, surgical history, family history, social history, and previous encounter notes.  Time spent on visit including pre-visit chart review and post-visit care and charting was 30 minutes.   I, Burt Knack, am acting as  transcriptionist for Quillian Quince, MD.  I have reviewed the above documentation for accuracy and completeness, and I agree with the above. -  Quillian Quince, MD

## 2023-06-02 ENCOUNTER — Ambulatory Visit (INDEPENDENT_AMBULATORY_CARE_PROVIDER_SITE_OTHER): Payer: BC Managed Care – PPO | Admitting: Family Medicine

## 2023-06-12 ENCOUNTER — Other Ambulatory Visit: Payer: Self-pay | Admitting: Orthopedic Surgery

## 2023-06-12 DIAGNOSIS — M79672 Pain in left foot: Secondary | ICD-10-CM

## 2023-06-24 DIAGNOSIS — N390 Urinary tract infection, site not specified: Secondary | ICD-10-CM | POA: Diagnosis not present

## 2023-06-24 DIAGNOSIS — R350 Frequency of micturition: Secondary | ICD-10-CM | POA: Diagnosis not present

## 2023-06-30 ENCOUNTER — Ambulatory Visit (INDEPENDENT_AMBULATORY_CARE_PROVIDER_SITE_OTHER): Payer: BC Managed Care – PPO | Admitting: Family Medicine

## 2023-07-09 DIAGNOSIS — R319 Hematuria, unspecified: Secondary | ICD-10-CM | POA: Diagnosis not present

## 2023-07-09 DIAGNOSIS — N390 Urinary tract infection, site not specified: Secondary | ICD-10-CM | POA: Diagnosis not present

## 2023-07-14 ENCOUNTER — Ambulatory Visit (INDEPENDENT_AMBULATORY_CARE_PROVIDER_SITE_OTHER): Payer: Medicare Other | Admitting: Family Medicine

## 2023-07-14 ENCOUNTER — Encounter (INDEPENDENT_AMBULATORY_CARE_PROVIDER_SITE_OTHER): Payer: Self-pay | Admitting: Family Medicine

## 2023-07-14 VITALS — BP 135/81 | HR 59 | Temp 98.6°F | Ht 63.0 in | Wt 215.0 lb

## 2023-07-14 DIAGNOSIS — Z6838 Body mass index (BMI) 38.0-38.9, adult: Secondary | ICD-10-CM | POA: Diagnosis not present

## 2023-07-14 DIAGNOSIS — F439 Reaction to severe stress, unspecified: Secondary | ICD-10-CM | POA: Diagnosis not present

## 2023-07-14 DIAGNOSIS — E669 Obesity, unspecified: Secondary | ICD-10-CM

## 2023-07-14 NOTE — Progress Notes (Signed)
Chief Complaint:   OBESITY Cynthia Short is here to discuss her progress with her obesity treatment plan along with follow-up of her obesity related diagnoses. Annelise is on practicing portion control and making smarter food choices, such as increasing vegetables and decreasing simple carbohydrates and states she is following her eating plan approximately 50% of the time. Srah states she is doing 0 minutes 0 times per week.  Today's visit was #: 15 Starting weight: 225 lbs Starting date: 11/26/2021 Today's weight: 215 lbs Today's date: 07/14/2023 Total lbs lost to date: 10 Total lbs lost since last in-office visit: 0  Interim History: Patient has been maintaining her weight over the last 2 to 3 months.  She has missed some appointments due to being busy in the process of moving.  Her sister is in the ICU as well, so her stress level is high.  She is trying to portion control and make smarter food choices.  Subjective:   1. Stress Patient's stress has increased with multiple stressors right now, and she has not been able to concentrate on weight loss.  She has been mindful of her emotional eating behavior and she has done well with minimizing emotional eating behavior.  Assessment/Plan:   1. Stress Stress reduction techniques were discussed with the patient, and she was encouraged to remain mindful of emotional eating behavior and we will follow-up at her next visit in 3 to 4 weeks.  2. BMI 38.0-38.9,adult  3. Obesity, Beginning BMI 39.86 [name] is currently in the action stage of change. As such, her goal is to continue with weight loss efforts. She has agreed to practicing portion control and making smarter food choices, such as increasing vegetables and decreasing simple carbohydrates.   Behavioral modification strategies: emotional eating strategies.  Ellamarie has agreed to follow-up with our clinic in 3 to 4 weeks. She was informed of the importance of frequent follow-up visits to  maximize her success with intensive lifestyle modifications for her multiple health conditions.   Objective:   Blood pressure 135/81, pulse (!) 59, temperature 98.6 F (37 C), height 5\' 3"  (1.6 m), weight 215 lb (97.5 kg), SpO2 97%. Body mass index is 38.09 kg/m.  Lab Results  Component Value Date   CREATININE 0.70 05/01/2022   BUN 9 05/01/2022   NA 142 05/01/2022   K 4.9 05/01/2022   CL 101 05/01/2022   CO2 26 05/01/2022   Lab Results  Component Value Date   ALT 27 05/01/2022   AST 24 05/01/2022   ALKPHOS 85 05/01/2022   BILITOT 0.3 05/01/2022   Lab Results  Component Value Date   HGBA1C 6.6 (H) 11/26/2021   HGBA1C 6.7 (H) 05/07/2021   HGBA1C 7.0 12/04/2020   HGBA1C 6.7 06/08/2020   HGBA1C 6.5 (H) 03/15/2020   Lab Results  Component Value Date   INSULIN 19.1 05/01/2022   INSULIN 37.5 (H) 05/07/2021   INSULIN 24.6 03/15/2020   INSULIN 20.8 10/31/2019   INSULIN 18.5 02/15/2019   Lab Results  Component Value Date   TSH 1.880 11/26/2021   Lab Results  Component Value Date   CHOL 162 11/26/2021   HDL 42 11/26/2021   LDLCALC 101 (H) 11/26/2021   TRIG 106 11/26/2021   CHOLHDL 3.9 11/26/2021   Lab Results  Component Value Date   VD25OH 25.5 (L) 05/01/2022   VD25OH 51.2 11/26/2021   VD25OH 42.4 05/07/2021   Lab Results  Component Value Date   WBC 7.7 11/26/2021   HGB 12.2  11/26/2021   HCT 38.1 11/26/2021   MCV 86 11/26/2021   PLT 300 11/26/2021   No results found for: "IRON", "TIBC", "FERRITIN"  Attestation Statements:   Reviewed by clinician on day of visit: allergies, medications, problem list, medical history, surgical history, family history, social history, and previous encounter notes.  Time spent on visit including pre-visit chart review and post-visit care and charting was 35 minutes.   I, Burt Knack, am acting as transcriptionist for Quillian Quince, MD.  I have reviewed the above documentation for accuracy and completeness, and I agree  with the above. -  Quillian Quince, MD

## 2023-07-27 DIAGNOSIS — E119 Type 2 diabetes mellitus without complications: Secondary | ICD-10-CM | POA: Diagnosis not present

## 2023-07-27 DIAGNOSIS — I10 Essential (primary) hypertension: Secondary | ICD-10-CM | POA: Diagnosis not present

## 2023-07-27 DIAGNOSIS — R519 Headache, unspecified: Secondary | ICD-10-CM | POA: Diagnosis not present

## 2023-07-31 DIAGNOSIS — E119 Type 2 diabetes mellitus without complications: Secondary | ICD-10-CM | POA: Diagnosis not present

## 2023-07-31 DIAGNOSIS — J0110 Acute frontal sinusitis, unspecified: Secondary | ICD-10-CM | POA: Diagnosis not present

## 2023-08-12 ENCOUNTER — Encounter (INDEPENDENT_AMBULATORY_CARE_PROVIDER_SITE_OTHER): Payer: Self-pay | Admitting: Family Medicine

## 2023-08-12 ENCOUNTER — Ambulatory Visit (INDEPENDENT_AMBULATORY_CARE_PROVIDER_SITE_OTHER): Payer: Medicare Other | Admitting: Family Medicine

## 2023-08-12 VITALS — BP 160/83 | HR 62 | Temp 98.2°F | Ht 63.0 in | Wt 221.0 lb

## 2023-08-12 DIAGNOSIS — E782 Mixed hyperlipidemia: Secondary | ICD-10-CM

## 2023-08-12 DIAGNOSIS — E1169 Type 2 diabetes mellitus with other specified complication: Secondary | ICD-10-CM

## 2023-08-12 DIAGNOSIS — E538 Deficiency of other specified B group vitamins: Secondary | ICD-10-CM | POA: Diagnosis not present

## 2023-08-12 DIAGNOSIS — Z6839 Body mass index (BMI) 39.0-39.9, adult: Secondary | ICD-10-CM

## 2023-08-12 DIAGNOSIS — Z794 Long term (current) use of insulin: Secondary | ICD-10-CM

## 2023-08-12 DIAGNOSIS — I10 Essential (primary) hypertension: Secondary | ICD-10-CM

## 2023-08-12 DIAGNOSIS — Z7985 Long-term (current) use of injectable non-insulin antidiabetic drugs: Secondary | ICD-10-CM

## 2023-08-12 DIAGNOSIS — E669 Obesity, unspecified: Secondary | ICD-10-CM

## 2023-08-12 DIAGNOSIS — E559 Vitamin D deficiency, unspecified: Secondary | ICD-10-CM

## 2023-08-12 MED ORDER — CHLORTHALIDONE 25 MG PO TABS: 25.0000 mg | ORAL_TABLET | Freq: Every day | ORAL | 0 refills | Status: DC

## 2023-08-13 ENCOUNTER — Encounter (INDEPENDENT_AMBULATORY_CARE_PROVIDER_SITE_OTHER): Payer: Self-pay | Admitting: Family Medicine

## 2023-08-13 DIAGNOSIS — I10 Essential (primary) hypertension: Secondary | ICD-10-CM | POA: Diagnosis not present

## 2023-08-13 DIAGNOSIS — R350 Frequency of micturition: Secondary | ICD-10-CM | POA: Diagnosis not present

## 2023-08-13 LAB — LIPID PANEL WITH LDL/HDL RATIO
Cholesterol, Total: 150 mg/dL (ref 100–199)
HDL: 47 mg/dL (ref >39)
LDL Chol Calc (NIH): 86 mg/dL (ref 0–99)
LDL/HDL Ratio: 1.8 ratio (ref 0.0–3.2)
Triglycerides: 88 mg/dL (ref 0–149)
VLDL Cholesterol Cal: 17 mg/dL (ref 5–40)

## 2023-08-13 LAB — TSH: TSH: 2.16 u[IU]/mL (ref 0.450–4.500)

## 2023-08-13 LAB — HEMOGLOBIN A1C
Est. average glucose Bld gHb Est-mCnc: 140 mg/dL
Hgb A1c MFr Bld: 6.5 % — ABNORMAL HIGH (ref 4.8–5.6)

## 2023-08-13 LAB — CMP14+EGFR
ALT: 17 IU/L (ref 0–32)
AST: 19 IU/L (ref 0–40)
Albumin: 3.9 g/dL (ref 3.9–4.9)
Alkaline Phosphatase: 89 IU/L (ref 44–121)
BUN/Creatinine Ratio: 14 (ref 12–28)
BUN: 9 mg/dL (ref 8–27)
Bilirubin Total: 0.4 mg/dL (ref 0.0–1.2)
CO2: 26 mmol/L (ref 20–29)
Calcium: 9.1 mg/dL (ref 8.7–10.3)
Chloride: 102 mmol/L (ref 96–106)
Creatinine, Ser: 0.65 mg/dL (ref 0.57–1.00)
Globulin, Total: 2.8 g/dL (ref 1.5–4.5)
Glucose: 97 mg/dL (ref 70–99)
Potassium: 3.9 mmol/L (ref 3.5–5.2)
Sodium: 142 mmol/L (ref 134–144)
Total Protein: 6.7 g/dL (ref 6.0–8.5)
eGFR: 97 mL/min/{1.73_m2} (ref >59)

## 2023-08-13 LAB — VITAMIN B12: Vitamin B-12: 329 pg/mL (ref 232–1245)

## 2023-08-13 LAB — VITAMIN D 25 HYDROXY (VIT D DEFICIENCY, FRACTURES): Vit D, 25-Hydroxy: 22.6 ng/mL — ABNORMAL LOW (ref 30.0–100.0)

## 2023-08-13 LAB — MICROALBUMIN / CREATININE URINE RATIO
Creatinine, Urine: 112.8 mg/dL
Microalb/Creat Ratio: 20 mg/g{creat} (ref 0–29)
Microalbumin, Urine: 22.1 ug/mL

## 2023-08-13 LAB — INSULIN, RANDOM: INSULIN: 12.2 u[IU]/mL (ref 2.6–24.9)

## 2023-08-13 NOTE — Progress Notes (Signed)
Chief Complaint:   OBESITY Cynthia Short is here to discuss her progress with her obesity treatment plan along with follow-up of her obesity related diagnoses. Cynthia Short is on practicing portion control and making smarter food choices, such as increasing vegetables and decreasing simple carbohydrates and states she is following her eating plan approximately 0% of the time. Cynthia Short states she has been moving more.   Today's visit was #: 16 Starting weight: 225 lbs Starting date: 11/26/2021 Today's weight: 221 lbs Today's date: 08/12/2023 Total lbs lost to date: 4 Total lbs lost since last in-office visit: 0  Interim History: Patient has been under a lot of stress and she has not been able to concentrate on her weight loss.  Subjective:   1. Essential hypertension Patient's blood pressure has been elevated both at home and in the office for multiple visits.  She is at high risk of renal disease with increased urine: MAB and diabetes mellitus.  She is on an ARB and beta-blocker.  2. Type 2 diabetes mellitus with other specified complication, with long-term current use of insulin (HCC) Patient continues to work on her diet.  She stopped her Ozempic due to GI upset but she would like to restart.  3. Vitamin D deficiency Patient is forgetting to take her vitamin D regularly, and she is due for labs.  4. B12 deficiency Patient has a history of diabetes mellitus, and she is at high risk of B12 deficiency.  5. Mixed hyperlipidemia Patient is working on decreasing cholesterol in her diet.  She denies chest pain, and she is due for labs.  Assessment/Plan:   1. Essential hypertension Patient agreed to start chlorthalidone 25 mg once daily with no refills.  She will continue losartan and Toprol, and we will follow-up at her next visit in 1 month.  - chlorthalidone (HYGROTON) 25 MG tablet; Take 1 tablet (25 mg total) by mouth daily.  Dispense: 30 tablet; Refill: 0  2. Type 2 diabetes mellitus with  other specified complication, with long-term current use of insulin (HCC) We will check labs today.  Patient agreed to restart Ozempic at 0.25 mg once weekly (no refill needed).  We will follow-up at her next visit in 1 month.  - Microalbumin / creatinine urine ratio - Insulin, random - Hemoglobin A1c - CMP14+EGFR  3. Vitamin D deficiency We will check labs today, and we will follow-up at patient's next visit.  - VITAMIN D 25 Hydroxy (Vit-D Deficiency, Fractures)  4. B12 deficiency We will check labs today, and we will follow-up at patient's next visit.  - Vitamin B12  5. Mixed hyperlipidemia We will check labs today.  Patient will continue with her diet, and we will follow-up at her next visit.  - TSH - Lipid Panel With LDL/HDL Ratio  6. BMI 39.0-39.9,adult  7. Obesity, Beginning BMI 39.86 Cynthia Short is currently in the action stage of change. As such, her goal is to continue with weight loss efforts. She has agreed to keeping a food journal and adhering to recommended goals of 1200 calories and 75+ grams of protein daily.   Behavioral modification strategies: no skipping meals.  Cynthia Short has agreed to follow-up with our clinic in 4 weeks. She was informed of the importance of frequent follow-up visits to maximize her success with intensive lifestyle modifications for her multiple health conditions.   Cynthia Short was informed we would discuss her lab results at her next visit unless there is a critical issue that needs to be addressed sooner.  Cynthia Short agreed to keep her next visit at the agreed upon time to discuss these results.  Objective:   Blood pressure (!) 160/83, pulse 62, temperature 98.2 F (36.8 C), height 5\' 3"  (1.6 m), weight 221 lb (100.2 kg), SpO2 96%. Body mass index is 39.15 kg/m.  Lab Results  Component Value Date   CREATININE 0.65 08/12/2023   BUN 9 08/12/2023   NA 142 08/12/2023   K 3.9 08/12/2023   CL 102 08/12/2023   CO2 26 08/12/2023   Lab Results  Component  Value Date   ALT 17 08/12/2023   AST 19 08/12/2023   ALKPHOS 89 08/12/2023   BILITOT 0.4 08/12/2023   Lab Results  Component Value Date   HGBA1C 6.5 (H) 08/12/2023   HGBA1C 6.6 (H) 11/26/2021   HGBA1C 6.7 (H) 05/07/2021   HGBA1C 7.0 12/04/2020   HGBA1C 6.7 06/08/2020   Lab Results  Component Value Date   INSULIN WILL FOLLOW 08/12/2023   INSULIN 19.1 05/01/2022   INSULIN 37.5 (H) 05/07/2021   INSULIN 24.6 03/15/2020   INSULIN 20.8 10/31/2019   Lab Results  Component Value Date   TSH 2.160 08/12/2023   Lab Results  Component Value Date   CHOL 150 08/12/2023   HDL 47 08/12/2023   LDLCALC 86 08/12/2023   TRIG 88 08/12/2023   CHOLHDL 3.9 11/26/2021   Lab Results  Component Value Date   VD25OH 22.6 (L) 08/12/2023   VD25OH 25.5 (L) 05/01/2022   VD25OH 51.2 11/26/2021   Lab Results  Component Value Date   WBC 7.7 11/26/2021   HGB 12.2 11/26/2021   HCT 38.1 11/26/2021   MCV 86 11/26/2021   PLT 300 11/26/2021   No results found for: "IRON", "TIBC", "FERRITIN"  Attestation Statements:   Reviewed by clinician on day of visit: allergies, medications, problem list, medical history, surgical history, family history, social history, and previous encounter notes.   I, Burt Knack, am acting as transcriptionist for Quillian Quince, MD.  I have reviewed the above documentation for accuracy and completeness, and I agree with the above. -  Quillian Quince, MD

## 2023-08-20 ENCOUNTER — Encounter (INDEPENDENT_AMBULATORY_CARE_PROVIDER_SITE_OTHER): Payer: Self-pay | Admitting: Family Medicine

## 2023-08-21 ENCOUNTER — Other Ambulatory Visit (INDEPENDENT_AMBULATORY_CARE_PROVIDER_SITE_OTHER): Payer: Self-pay | Admitting: Family Medicine

## 2023-08-21 DIAGNOSIS — E1169 Type 2 diabetes mellitus with other specified complication: Secondary | ICD-10-CM

## 2023-08-25 DIAGNOSIS — M1712 Unilateral primary osteoarthritis, left knee: Secondary | ICD-10-CM | POA: Diagnosis not present

## 2023-08-25 DIAGNOSIS — M25562 Pain in left knee: Secondary | ICD-10-CM | POA: Diagnosis not present

## 2023-08-25 DIAGNOSIS — G8929 Other chronic pain: Secondary | ICD-10-CM | POA: Diagnosis not present

## 2023-08-25 DIAGNOSIS — M25462 Effusion, left knee: Secondary | ICD-10-CM | POA: Diagnosis not present

## 2023-08-26 ENCOUNTER — Other Ambulatory Visit (INDEPENDENT_AMBULATORY_CARE_PROVIDER_SITE_OTHER): Payer: Self-pay | Admitting: Family Medicine

## 2023-08-26 DIAGNOSIS — Z794 Long term (current) use of insulin: Secondary | ICD-10-CM

## 2023-09-03 ENCOUNTER — Other Ambulatory Visit (INDEPENDENT_AMBULATORY_CARE_PROVIDER_SITE_OTHER): Payer: Self-pay | Admitting: Family Medicine

## 2023-09-03 DIAGNOSIS — I10 Essential (primary) hypertension: Secondary | ICD-10-CM

## 2023-09-04 DIAGNOSIS — E1129 Type 2 diabetes mellitus with other diabetic kidney complication: Secondary | ICD-10-CM | POA: Diagnosis not present

## 2023-09-04 DIAGNOSIS — I10 Essential (primary) hypertension: Secondary | ICD-10-CM | POA: Diagnosis not present

## 2023-09-04 DIAGNOSIS — E119 Type 2 diabetes mellitus without complications: Secondary | ICD-10-CM | POA: Diagnosis not present

## 2023-09-08 DIAGNOSIS — L300 Nummular dermatitis: Secondary | ICD-10-CM | POA: Diagnosis not present

## 2023-09-09 ENCOUNTER — Ambulatory Visit (INDEPENDENT_AMBULATORY_CARE_PROVIDER_SITE_OTHER): Payer: BC Managed Care – PPO | Admitting: Family Medicine

## 2023-09-10 DIAGNOSIS — R319 Hematuria, unspecified: Secondary | ICD-10-CM | POA: Diagnosis not present

## 2023-09-10 DIAGNOSIS — R3129 Other microscopic hematuria: Secondary | ICD-10-CM | POA: Insufficient documentation

## 2023-09-10 DIAGNOSIS — N390 Urinary tract infection, site not specified: Secondary | ICD-10-CM | POA: Insufficient documentation

## 2023-09-19 DIAGNOSIS — L089 Local infection of the skin and subcutaneous tissue, unspecified: Secondary | ICD-10-CM | POA: Diagnosis not present

## 2023-09-19 DIAGNOSIS — R21 Rash and other nonspecific skin eruption: Secondary | ICD-10-CM | POA: Diagnosis not present

## 2023-09-23 DIAGNOSIS — M25562 Pain in left knee: Secondary | ICD-10-CM | POA: Insufficient documentation

## 2023-09-24 DIAGNOSIS — M25562 Pain in left knee: Secondary | ICD-10-CM | POA: Diagnosis not present

## 2023-09-25 DIAGNOSIS — N2889 Other specified disorders of kidney and ureter: Secondary | ICD-10-CM | POA: Diagnosis not present

## 2023-09-25 DIAGNOSIS — K760 Fatty (change of) liver, not elsewhere classified: Secondary | ICD-10-CM | POA: Diagnosis not present

## 2023-09-29 DIAGNOSIS — L821 Other seborrheic keratosis: Secondary | ICD-10-CM | POA: Diagnosis not present

## 2023-09-29 DIAGNOSIS — L578 Other skin changes due to chronic exposure to nonionizing radiation: Secondary | ICD-10-CM | POA: Diagnosis not present

## 2023-10-01 DIAGNOSIS — E119 Type 2 diabetes mellitus without complications: Secondary | ICD-10-CM | POA: Diagnosis not present

## 2023-10-01 DIAGNOSIS — H353131 Nonexudative age-related macular degeneration, bilateral, early dry stage: Secondary | ICD-10-CM | POA: Diagnosis not present

## 2023-10-01 DIAGNOSIS — Z7984 Long term (current) use of oral hypoglycemic drugs: Secondary | ICD-10-CM | POA: Diagnosis not present

## 2023-10-01 DIAGNOSIS — H3530 Unspecified macular degeneration: Secondary | ICD-10-CM | POA: Diagnosis not present

## 2023-10-01 DIAGNOSIS — H25813 Combined forms of age-related cataract, bilateral: Secondary | ICD-10-CM | POA: Diagnosis not present

## 2023-10-07 ENCOUNTER — Ambulatory Visit (INDEPENDENT_AMBULATORY_CARE_PROVIDER_SITE_OTHER): Payer: Medicare Other | Admitting: Family Medicine

## 2023-10-07 ENCOUNTER — Encounter (INDEPENDENT_AMBULATORY_CARE_PROVIDER_SITE_OTHER): Payer: Self-pay | Admitting: Family Medicine

## 2023-10-07 VITALS — BP 171/82 | HR 56 | Temp 98.2°F | Ht 63.0 in | Wt 219.0 lb

## 2023-10-07 DIAGNOSIS — K760 Fatty (change of) liver, not elsewhere classified: Secondary | ICD-10-CM | POA: Diagnosis not present

## 2023-10-07 DIAGNOSIS — I10 Essential (primary) hypertension: Secondary | ICD-10-CM | POA: Diagnosis not present

## 2023-10-07 DIAGNOSIS — Z7984 Long term (current) use of oral hypoglycemic drugs: Secondary | ICD-10-CM

## 2023-10-07 DIAGNOSIS — E1169 Type 2 diabetes mellitus with other specified complication: Secondary | ICD-10-CM | POA: Diagnosis not present

## 2023-10-07 DIAGNOSIS — E669 Obesity, unspecified: Secondary | ICD-10-CM

## 2023-10-07 DIAGNOSIS — E66812 Obesity, class 2: Secondary | ICD-10-CM

## 2023-10-07 DIAGNOSIS — Z6839 Body mass index (BMI) 39.0-39.9, adult: Secondary | ICD-10-CM

## 2023-10-07 DIAGNOSIS — Z6838 Body mass index (BMI) 38.0-38.9, adult: Secondary | ICD-10-CM

## 2023-10-07 DIAGNOSIS — E538 Deficiency of other specified B group vitamins: Secondary | ICD-10-CM | POA: Diagnosis not present

## 2023-10-07 DIAGNOSIS — E559 Vitamin D deficiency, unspecified: Secondary | ICD-10-CM | POA: Diagnosis not present

## 2023-10-07 MED ORDER — VITAMIN D (ERGOCALCIFEROL) 1.25 MG (50000 UNIT) PO CAPS
50000.0000 [IU] | ORAL_CAPSULE | ORAL | 0 refills | Status: DC
Start: 2023-10-07 — End: 2024-02-25

## 2023-10-07 MED ORDER — AMLODIPINE BESYLATE 5 MG PO TABS: 5.0000 mg | ORAL_TABLET | Freq: Every day | ORAL | 0 refills | Status: DC

## 2023-10-07 MED ORDER — CYANOCOBALAMIN 1000 MCG PO TABS
2000.0000 ug | ORAL_TABLET | Freq: Every day | ORAL | 0 refills | Status: DC
Start: 2023-10-07 — End: 2024-02-25

## 2023-10-07 NOTE — Progress Notes (Signed)
.smr  Office: [phone]  /  Fax: [phone]  WEIGHT SUMMARY AND BIOMETRICS  Anthropometric Measurements Height: 5\' 3"  (1.6 m) Weight: 219 lb (99.3 kg) BMI (Calculated): 38.8 Weight at Last Visit: 221 LB Weight Lost Since Last Visit: 2 lb Weight Gained Since Last Visit: 0 Starting Weight: 225 LB Total Weight Loss (lbs): 6 lb (2.722 kg) Peak Weight: 263 lb   Body Composition  Body Fat %: 47.4 % Fat Mass (lbs): 104 lbs Muscle Mass (lbs): 109.6 lbs Total Body Water (lbs): 79 lbs Visceral Fat Rating : 15   Other Clinical Data Fasting: yes Labs: NO Today's Visit #: 17 Starting Date: 11/26/21    Chief Complaint: OBESITY   History of Present Illness   The patient, with a history of hypertension, vitamin D deficiency, B12 deficiency, and obesity, presents for a follow-up visit. She reports a weight loss of two pounds over the last two months and has been maintaining a food journal, consuming approximately 1200 calories and 75 grams of protein per day. She has not been engaging in formal exercise but has been more active, especially after a recent move.  The patient's blood pressure readings have been elevated, with a reading of 148/84, which increased upon recheck to 171/83. She is currently on atenolol, chlorthalidone, and losartan for hypertension management and is also making dietary and exercise modifications to improve her blood pressure.  The patient has been experiencing increased snacking at night, which she attributes to stress. She also reports a recent increase in blood sugar readings, with a recent HbA1c of 7.1, a significant increase from previous readings. She took chlorthalidone for about a week but discontinued due to severe leg cramps and feeling unwell.  The patient also reports recent personal stressors, including a second COVID-19 infection and a family member's heart attack. During this period, her blood pressure and blood sugar levels were relatively  stable, but she noticed slight puffiness in her ankles and feet and a faint headache. Her blood pressure readings have been increasingly high, prompting her to take chlorthalidone again.  The patient has also been seen by a urologist due to the presence of blood in her urine. An ultrasound revealed a fatty liver and nonobstructive kidney stones in the left kidney. She is currently awaiting a cystoscopy appointment.  The patient requests refills of her B12 and vitamin D prescriptions. She has been working on diet and exercise to manage her hypertension and has been more active since her recent move. Despite these efforts, her blood pressure remains elevated, and she is seeking further guidance on managing her hypertension, vitamin D deficiency, B12 deficiency, and obesity.          PHYSICAL EXAM:  Blood pressure (!) 171/82, pulse (!) 56, temperature 98.2 F (36.8 C), height 5\' 3"  (1.6 m), weight 219 lb (99.3 kg), SpO2 97%. Body mass index is 38.79 kg/m.  DIAGNOSTIC DATA REVIEWED:  BMET    Component Value Date/Time   NA 142 08/12/2023 1121   K 3.9 08/12/2023 1121   CL 102 08/12/2023 1121   CO2 26 08/12/2023 1121   GLUCOSE 97 08/12/2023 1121   BUN 9 08/12/2023 1121   CREATININE 0.65 08/12/2023 1121   CALCIUM 9.1 08/12/2023 1121   GFRNONAA 87 12/04/2020 0000   GFRAA 105 12/04/2020 0000   Lab Results  Component Value Date   HGBA1C 6.5 (H) 08/12/2023   HGBA1C 6.3 (H) 08/26/2018   Lab Results  Component Value Date   INSULIN 12.2 08/12/2023  INSULIN 14.9 08/26/2018   Lab Results  Component Value Date   TSH 2.160 08/12/2023   CBC    Component Value Date/Time   WBC 7.7 11/26/2021 1316   RBC 4.42 11/26/2021 1316   HGB 12.2 11/26/2021 1316   HCT 38.1 11/26/2021 1316   PLT 300 11/26/2021 1316   MCV 86 11/26/2021 1316   MCH 27.6 11/26/2021 1316   MCHC 32.0 11/26/2021 1316   RDW 13.8 11/26/2021 1316   Iron Studies No results found for: "IRON", "TIBC", "FERRITIN",  "IRONPCTSAT" Lipid Panel     Component Value Date/Time   CHOL 150 08/12/2023 1121   TRIG 88 08/12/2023 1121   HDL 47 08/12/2023 1121   CHOLHDL 3.9 11/26/2021 1316   LDLCALC 86 08/12/2023 1121   Hepatic Function Panel     Component Value Date/Time   PROT 6.7 08/12/2023 1121   ALBUMIN 3.9 08/12/2023 1121   AST 19 08/12/2023 1121   ALT 17 08/12/2023 1121   ALKPHOS 89 08/12/2023 1121   BILITOT 0.4 08/12/2023 1121      Component Value Date/Time   TSH 2.160 08/12/2023 1121   Nutritional Lab Results  Component Value Date   VD25OH 22.6 (L) 08/12/2023   VD25OH 25.5 (L) 05/01/2022   VD25OH 51.2 11/26/2021     Assessment and Plan    Hypertension Elevated blood pressure readings (148/84 and 171/83) despite current regimen of Atenolol, Chlorthalidone, and Losartan. Patient reported discontinuation of Chlorthalidone due to side effects. -Start Amlodipine 5mg  daily for blood pressure control.  Vitamin D Deficiency Persistent deficiency despite supplementation. Patient admits to non-adherence. -Refill Vitamin D prescription and encourage patient to take as directed.  Vitamin B12 Deficiency Stable with supplementation. -Refill Vitamin B12 prescription.  Obesity Patient has lost 2 pounds over the last two months. 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 -Continue current diet and exercise regimen. -Encourage patient to increase protein intake to 75 grams per day and maintain total calorie intake around 1200-1300 calories per day. -be careful to avoid Halloween candy temptations.  Fatty Liver Identified on recent ultrasound. Discussed the importance of weight loss in managing this condition. -Continue weight loss efforts.  DM II Recent HbA1c of 7.1, an increase from previous readings. Patient reported correlation with increasing blood pressure readings. -Continue monitoring blood glucose levels. -Continue diet and exercise to control glucose  levels  Follow-up -Schedule next appointment before Thanksgiving and subsequent appointment before Christmas. -Continue monitoring blood pressure and blood glucose levels at home.         I have personally spent 40 minutes total time today in preparation, patient care, and documentation for this visit, including the following: review of clinical lab tests; review of medical tests/procedures/services.    She was informed of the importance of frequent follow up visits to maximize her success with intensive lifestyle modifications for her multiple health conditions.    Quillian Quince, MD

## 2023-10-12 DIAGNOSIS — M25562 Pain in left knee: Secondary | ICD-10-CM | POA: Diagnosis not present

## 2023-10-14 ENCOUNTER — Other Ambulatory Visit: Payer: Self-pay | Admitting: Obstetrics and Gynecology

## 2023-10-14 DIAGNOSIS — Z1231 Encounter for screening mammogram for malignant neoplasm of breast: Secondary | ICD-10-CM

## 2023-10-21 DIAGNOSIS — N390 Urinary tract infection, site not specified: Secondary | ICD-10-CM | POA: Diagnosis not present

## 2023-10-21 DIAGNOSIS — R8290 Unspecified abnormal findings in urine: Secondary | ICD-10-CM | POA: Diagnosis not present

## 2023-10-21 DIAGNOSIS — R3129 Other microscopic hematuria: Secondary | ICD-10-CM | POA: Diagnosis not present

## 2023-10-21 DIAGNOSIS — N200 Calculus of kidney: Secondary | ICD-10-CM | POA: Diagnosis not present

## 2023-10-23 DIAGNOSIS — S83249A Other tear of medial meniscus, current injury, unspecified knee, initial encounter: Secondary | ICD-10-CM | POA: Insufficient documentation

## 2023-10-23 DIAGNOSIS — S83242D Other tear of medial meniscus, current injury, left knee, subsequent encounter: Secondary | ICD-10-CM | POA: Diagnosis not present

## 2023-10-27 ENCOUNTER — Other Ambulatory Visit (INDEPENDENT_AMBULATORY_CARE_PROVIDER_SITE_OTHER): Payer: Self-pay | Admitting: Family Medicine

## 2023-10-27 DIAGNOSIS — E559 Vitamin D deficiency, unspecified: Secondary | ICD-10-CM

## 2023-11-05 ENCOUNTER — Ambulatory Visit (INDEPENDENT_AMBULATORY_CARE_PROVIDER_SITE_OTHER): Payer: Medicare Other | Admitting: Family Medicine

## 2023-11-05 ENCOUNTER — Encounter (INDEPENDENT_AMBULATORY_CARE_PROVIDER_SITE_OTHER): Payer: Self-pay | Admitting: Family Medicine

## 2023-11-05 VITALS — BP 123/61 | HR 67 | Temp 97.8°F | Ht 63.0 in | Wt 224.0 lb

## 2023-11-05 DIAGNOSIS — I10 Essential (primary) hypertension: Secondary | ICD-10-CM

## 2023-11-05 DIAGNOSIS — E1169 Type 2 diabetes mellitus with other specified complication: Secondary | ICD-10-CM

## 2023-11-05 DIAGNOSIS — N200 Calculus of kidney: Secondary | ICD-10-CM | POA: Diagnosis not present

## 2023-11-05 DIAGNOSIS — R600 Localized edema: Secondary | ICD-10-CM

## 2023-11-05 DIAGNOSIS — Z6839 Body mass index (BMI) 39.0-39.9, adult: Secondary | ICD-10-CM

## 2023-11-05 DIAGNOSIS — E119 Type 2 diabetes mellitus without complications: Secondary | ICD-10-CM | POA: Diagnosis not present

## 2023-11-05 DIAGNOSIS — Z794 Long term (current) use of insulin: Secondary | ICD-10-CM | POA: Diagnosis not present

## 2023-11-05 DIAGNOSIS — E669 Obesity, unspecified: Secondary | ICD-10-CM

## 2023-11-05 DIAGNOSIS — Z7984 Long term (current) use of oral hypoglycemic drugs: Secondary | ICD-10-CM

## 2023-11-05 NOTE — Progress Notes (Signed)
.smr  Office: [phone]  /  Fax: [phone]  WEIGHT SUMMARY AND BIOMETRICS  Anthropometric Measurements Height: 5\' 3"  (1.6 m) Weight: 224 lb (101.6 kg) BMI (Calculated): 39.69 Weight at Last Visit: 219 lb Weight Lost Since Last Visit: 0 Weight Gained Since Last Visit: 5 lb Starting Weight: 225lb Total Weight Loss (lbs): 1 lb (0.454 kg) Peak Weight: 263 lb   Body Composition  Body Fat %: 49.9 % Fat Mass (lbs): 112 lbs Muscle Mass (lbs): 106.8 lbs Total Body Water (lbs): 84.6 lbs Visceral Fat Rating : 17   Other Clinical Data Fasting: no Labs: no Today's Visit #: 18 Starting Date: 11/26/21    Chief Complaint: OBESITY   History of Present Illness   The patient, a known case of type 2 diabetes and obesity, presents for a follow-up consultation. She reports a recent weight gain of five pounds over the past month, which she attributes to her recent birthday celebrations. Despite this, she has been attempting to maintain a diet of around 1200 calories with a protein intake of at least 75 grams, achieving this about 50% of the time. She is not engaged in any formal exercise but remains active due to recent moving and unpacking activities. She is currently on metformin for diabetes management and is making dietary modifications, including reducing simple carbohydrates, to help control her blood sugars. Her last hemoglobin A1c was 6.5.  The patient also reports issues with fluid retention, particularly in the feet and ankles, which have been consistently swollen for approximately 30 days. She has noticed a difference in the fit of her jeans, suggesting fluid retention in the legs as well. She is currently not taking chlorthalidone due to previous cramping side effects and is unsure if the amlodipine is contributing to the fluid retention.  In addition to these issues, the patient recently underwent an ultrasound for kidney stones, which were not obstructive. She saw a urologist  who performed a bladder sample and diagnosed a urinary tract infection (UTI), for which she completed a five-day course of Cytovene. She is also using a prescribed cream and taking cranberry pills daily. The patient reports that the kidney stones did not show up on a subsequent x-ray, leading to speculation that she may have passed them.  The patient's blood pressure has been fluctuating, with readings ranging from the 120s to the 160s. However, she reports that the higher readings have decreased in frequency over the past couple of weeks. She has been monitoring her blood glucose levels, primarily fasting readings, and reports an average of 135 over the past 90 days. She acknowledges that her beverage choices, particularly sweet tea and soft drinks, could be contributing to her elevated blood glucose levels.  The patient is also on Synthroid and has been struggling with the timing of her various medications, which she finds frustrating. She has recently restarted her vitamin D supplement.          PHYSICAL EXAM:  Blood pressure 123/61, pulse 67, temperature 97.8 F (36.6 C), height 5\' 3"  (1.6 m), weight 224 lb (101.6 kg), SpO2 97%. Body mass index is 39.68 kg/m.  DIAGNOSTIC DATA REVIEWED:  BMET    Component Value Date/Time   NA 142 08/12/2023 1121   K 3.9 08/12/2023 1121   CL 102 08/12/2023 1121   CO2 26 08/12/2023 1121   GLUCOSE 97 08/12/2023 1121   BUN 9 08/12/2023 1121   CREATININE 0.65 08/12/2023 1121   CALCIUM 9.1 08/12/2023 1121   GFRNONAA 87 12/04/2020 0000  GFRAA 105 12/04/2020 0000   Lab Results  Component Value Date   HGBA1C 6.5 (H) 08/12/2023   HGBA1C 6.3 (H) 08/26/2018   Lab Results  Component Value Date   INSULIN 12.2 08/12/2023   INSULIN 14.9 08/26/2018   Lab Results  Component Value Date   TSH 2.160 08/12/2023   CBC    Component Value Date/Time   WBC 7.7 11/26/2021 1316   RBC 4.42 11/26/2021 1316   HGB 12.2 11/26/2021 1316   HCT 38.1 11/26/2021  1316   PLT 300 11/26/2021 1316   MCV 86 11/26/2021 1316   MCH 27.6 11/26/2021 1316   MCHC 32.0 11/26/2021 1316   RDW 13.8 11/26/2021 1316   Iron Studies No results found for: "IRON", "TIBC", "FERRITIN", "IRONPCTSAT" Lipid Panel     Component Value Date/Time   CHOL 150 08/12/2023 1121   TRIG 88 08/12/2023 1121   HDL 47 08/12/2023 1121   CHOLHDL 3.9 11/26/2021 1316   LDLCALC 86 08/12/2023 1121   Hepatic Function Panel     Component Value Date/Time   PROT 6.7 08/12/2023 1121   ALBUMIN 3.9 08/12/2023 1121   AST 19 08/12/2023 1121   ALT 17 08/12/2023 1121   ALKPHOS 89 08/12/2023 1121   BILITOT 0.4 08/12/2023 1121      Component Value Date/Time   TSH 2.160 08/12/2023 1121   Nutritional Lab Results  Component Value Date   VD25OH 22.6 (L) 08/12/2023   VD25OH 25.5 (L) 05/01/2022   VD25OH 51.2 11/26/2021     Assessment and Plan    Type 2 Diabetes Mellitus Well-controlled with recent hemoglobin A1c of 6.5, though recent A1c was 7.1 likely due to stress and dietary factors. On metformin and working on dietary modifications. Discussed the impact of stress and sugary beverages on blood glucose levels. - Continue metformin - Encourage dietary modifications, particularly reducing sugary beverages - Monitor blood glucose levels regularly - Follow up with primary care physician in March for repeat A1c  Hypertension Fluctuating blood pressure readings, currently on amlodipine which may contribute to fluid retention. Not taking chlorthalidone due to cramps. Recent readings more stable but variable. Discussed impact of pain and stress on blood pressure and importance of proper hydration. - Continue amlodipine - Monitor blood pressure regularly and record readings - Discuss fluid retention and potential medication adjustments with primary care physician  Fluid Retention Likely secondary to amlodipine and discontinuation of chlorthalidone. Reports swelling in feet and ankles, recent  weight gain of 5 pounds. Discussed increased hydration and potential need to reintroduce a diuretic. - Encourage increased hydration - Monitor for signs of edema - Discuss potential reintroduction of a diuretic with primary care physician  Obesity Recent weight gain of 5 pounds. Attempting to maintain caloric intake of 1200 calories and protein intake of at least 75 grams, achieving this about 50% of the time. No formal exercise regimen but remains active with daily activities. Discussed benefits of regular physical activity and consistent dietary efforts. - Continue dietary efforts to reduce caloric intake and increase protein - Encourage regular physical activity - Monitor weight regularly  Kidney Stones Non-obstructive kidney stones. Recent ultrasound showed stones, but follow-up x-ray did not. Had a UTI treated with Cytovene and using prescribed cream and cranberry pills. Discussed importance of hydration to prevent stone formation and reduce bladder irritation. - Encourage increased hydration to prevent stone formation - Monitor for symptoms of kidney stones - Follow up with urologist as needed  General Health Maintenance Discussed importance of hydration and  dietary choices, emphasizing benefits of water over sugary or caffeinated beverages. Discussed vitamin D supplementation. - Encourage hydration with calorie-free, caffeine-free liquids - Discuss benefits of water over sugary or caffeinated beverages - Continue vitamin D supplementation  Follow-up - Schedule next appointment in December - Schedule January appointment to ensure timely follow-up.         I have personally spent 40 minutes total time today in preparation, patient care, and documentation for this visit, including the following: review of clinical lab tests; review of medical tests/procedures/services.    She was informed of the importance of frequent follow up visits to maximize her success with intensive  lifestyle modifications for her multiple health conditions.    Quillian Quince, MD

## 2023-11-19 ENCOUNTER — Ambulatory Visit
Admission: RE | Admit: 2023-11-19 | Discharge: 2023-11-19 | Disposition: A | Discharge status: Home / Self Care | Payer: Medicare Other | Source: Ambulatory Visit | Attending: Obstetrics and Gynecology | Admitting: Obstetrics and Gynecology

## 2023-11-19 DIAGNOSIS — Z1231 Encounter for screening mammogram for malignant neoplasm of breast: Secondary | ICD-10-CM

## 2023-11-24 DIAGNOSIS — E039 Hypothyroidism, unspecified: Secondary | ICD-10-CM | POA: Diagnosis not present

## 2023-11-24 DIAGNOSIS — E1165 Type 2 diabetes mellitus with hyperglycemia: Secondary | ICD-10-CM | POA: Diagnosis not present

## 2023-11-24 DIAGNOSIS — R809 Proteinuria, unspecified: Secondary | ICD-10-CM | POA: Diagnosis not present

## 2023-12-03 ENCOUNTER — Ambulatory Visit (INDEPENDENT_AMBULATORY_CARE_PROVIDER_SITE_OTHER): Payer: Medicare Other | Admitting: Family Medicine

## 2023-12-13 ENCOUNTER — Other Ambulatory Visit (INDEPENDENT_AMBULATORY_CARE_PROVIDER_SITE_OTHER): Payer: Self-pay | Admitting: Family Medicine

## 2023-12-13 DIAGNOSIS — E559 Vitamin D deficiency, unspecified: Secondary | ICD-10-CM

## 2023-12-22 DIAGNOSIS — J988 Other specified respiratory disorders: Secondary | ICD-10-CM | POA: Diagnosis not present

## 2023-12-31 ENCOUNTER — Encounter (INDEPENDENT_AMBULATORY_CARE_PROVIDER_SITE_OTHER): Payer: Self-pay | Admitting: Family Medicine

## 2023-12-31 ENCOUNTER — Ambulatory Visit (INDEPENDENT_AMBULATORY_CARE_PROVIDER_SITE_OTHER): Payer: Medicare Other | Admitting: Family Medicine

## 2023-12-31 VITALS — BP 123/73 | HR 60 | Temp 98.5°F | Ht 63.0 in | Wt 219.0 lb

## 2023-12-31 DIAGNOSIS — M25569 Pain in unspecified knee: Secondary | ICD-10-CM

## 2023-12-31 DIAGNOSIS — E119 Type 2 diabetes mellitus without complications: Secondary | ICD-10-CM

## 2023-12-31 DIAGNOSIS — E669 Obesity, unspecified: Secondary | ICD-10-CM

## 2023-12-31 DIAGNOSIS — R600 Localized edema: Secondary | ICD-10-CM

## 2023-12-31 DIAGNOSIS — Z794 Long term (current) use of insulin: Secondary | ICD-10-CM

## 2023-12-31 DIAGNOSIS — F3289 Other specified depressive episodes: Secondary | ICD-10-CM

## 2023-12-31 DIAGNOSIS — Z6838 Body mass index (BMI) 38.0-38.9, adult: Secondary | ICD-10-CM

## 2023-12-31 NOTE — Progress Notes (Signed)
.[facility]  Office: [phone]  /  Fax: [phone]  WEIGHT SUMMARY AND BIOMETRICS  Anthropometric Measurements Height: 5\' 3"  (1.6 m) Weight: 219 lb (99.3 kg) BMI (Calculated): 38.8 Weight at Last Visit: 224 lb Weight Lost Since Last Visit: 5 lb Weight Gained Since Last Visit: 0 Starting Weight: 225 lb Total Weight Loss (lbs): 6 lb (2.722 kg) Peak Weight: 263 lb   Body Composition  Body Fat %: 48.3 % Fat Mass (lbs): 106 lbs Muscle Mass (lbs): 107.8 lbs Total Body Water (lbs): 82.2 lbs Visceral Fat Rating : 16   Other Clinical Data Fasting: no Labs: no Today's Visit #: 19 Starting Date: 11/26/21    Chief Complaint: OBESITY    History of Present Illness   The patient, diagnosed with type two diabetes and obesity, has been actively working on diet, exercise, and weight loss to manage these conditions. Over the past two months, the patient has successfully lost five pounds. However, the patient's efforts have been inconsistent due to intermittent knee pain and a lapse in exercise and diet journaling over the holiday period.  The patient has been making dietary changes, including reducing the intake of tea and soft drinks, which she believes has been a significant motivator for her weight loss. The patient acknowledges being a boredom and stress eater, particularly at night, and has been working on being mindful of her eating habits. She has been experimenting with portion control and healthier snack options to avoid overeating during TV binge sessions.  Despite these efforts, the patient's blood sugars have been high, with an A1C of 7.0. The patient has been seeing an endocrinologist to manage her diabetes. She has also been dealing with high blood pressure, which has recently leveled out.  The patient has been experiencing knee pain, which has led to consultations with two different orthopedic doctors. One doctor suggested a knee replacement due to 7% arthritis, while the other  suggested a less invasive arthroscopic surgery to repair a torn meniscus. The patient is considering postponing the surgery due to recent improvements in her knee pain and concerns about her high A1C levels.  The patient has a history of urinary tract infections (UTIs) and has seen a specialist who found no issues with her kidneys or bladder. She has also been dealing with fluid retention, which has improved in the last month. The patient has been increasing her water intake, up to about four bottles a day, which she believes has contributed to her overall improvement.  The patient has been working on increasing her protein intake, acknowledging that she probably doesn't get enough. She has been considering adding a protein drink to her diet to help meet her protein needs. The patient has also been working on managing her emotional eating behaviors and has been experimenting with activities that she enjoys and make her feel productive to help raise her neurotransmitter levels and reduce her cravings.          PHYSICAL EXAM:  Blood pressure 123/73, pulse 60, temperature 98.5 F (36.9 C), height 5\' 3"  (1.6 m), weight 219 lb (99.3 kg), SpO2 97%. Body mass index is 38.79 kg/m.  DIAGNOSTIC DATA REVIEWED:  BMET    Component Value Date/Time   NA 142 08/12/2023 1121   K 3.9 08/12/2023 1121   CL 102 08/12/2023 1121   CO2 26 08/12/2023 1121   GLUCOSE 97 08/12/2023 1121   BUN 9 08/12/2023 1121   CREATININE 0.65 08/12/2023 1121   CALCIUM 9.1 08/12/2023 1121   GFRNONAA  87 12/04/2020 0000   GFRAA 105 12/04/2020 0000   Lab Results  Component Value Date   HGBA1C 6.5 (H) 08/12/2023   HGBA1C 6.3 (H) 08/26/2018   Lab Results  Component Value Date   INSULIN 12.2 08/12/2023   INSULIN 14.9 08/26/2018   Lab Results  Component Value Date   TSH 2.160 08/12/2023   CBC    Component Value Date/Time   WBC 7.7 11/26/2021 1316   RBC 4.42 11/26/2021 1316   HGB 12.2 11/26/2021 1316   HCT 38.1  11/26/2021 1316   PLT 300 11/26/2021 1316   MCV 86 11/26/2021 1316   MCH 27.6 11/26/2021 1316   MCHC 32.0 11/26/2021 1316   RDW 13.8 11/26/2021 1316   Iron Studies No results found for: "IRON", "TIBC", "FERRITIN", "IRONPCTSAT" Lipid Panel     Component Value Date/Time   CHOL 150 08/12/2023 1121   TRIG 88 08/12/2023 1121   HDL 47 08/12/2023 1121   CHOLHDL 3.9 11/26/2021 1316   LDLCALC 86 08/12/2023 1121   Hepatic Function Panel     Component Value Date/Time   PROT 6.7 08/12/2023 1121   ALBUMIN 3.9 08/12/2023 1121   AST 19 08/12/2023 1121   ALT 17 08/12/2023 1121   ALKPHOS 89 08/12/2023 1121   BILITOT 0.4 08/12/2023 1121      Component Value Date/Time   TSH 2.160 08/12/2023 1121   Nutritional Lab Results  Component Value Date   VD25OH 22.6 (L) 08/12/2023   VD25OH 25.5 (L) 05/01/2022   VD25OH 51.2 11/26/2021     Assessment and Plan    Type 2 Diabetes Mellitus Type 2 diabetes with recent improvement in blood glucose control. A1c is 7.0%. Morning blood glucose readings are 96-110 mg/dL. Improved control attributed to diet, exercise, and weight loss. Stress and recent infections may have impacted previous higher readings. Discussed maintaining hydration and increasing protein intake to support metabolism and muscle mass. - Continue current diet and exercise regimen - Increase protein intake - Consider adding protein drinks such as Premier Protein or [employer]life - Follow up in three weeks to monitor progress  Obesity Obesity with recent weight loss of five pounds over the last two months. Patient has reduced intake of tea and soft drinks, and increased water consumption. Emotional eating behaviors noted, particularly during stress and boredom. Discussed strategies to address emotional eating by engaging in enjoyable and productive activities. - Continue current dietary modifications - Increase water intake - Address emotional eating by engaging in enjoyable and productive  activities - Follow up in three weeks to monitor progress  Knee Pain Chronic knee pain with arthritis and possible torn meniscus. Two differing opinions from orthopedic surgeons: knee replacement vs. conservative management with possible arthroscopic surgery. Discussed risks and benefits of both options. Knee replacement has higher risk of complications and longer recovery time; arthroscopic surgery has a 75% chance of improvement and 25% chance of worsening. Patient is currently experiencing less pain and considering postponing surgery. Discussed potential benefits of waiting to improve A1c and overall health before surgery. - Postpone knee surgery for three months - Reassess knee pain and function in three months - Continue current pain management strategies  B LE edema Patient is improving overall health through lifestyle modifications, including increased water intake and reduced sugary drinks. Focus on emotional well-being and stress management. -decrease sodium in diet - Continue hydration and dietary changes   Emotional eating behaviors worse in the evening - Engage in activities that are enjoyable and productive to manage  stress and emotional eating - Follow up in three weeks to monitor overall health and progress  Follow-up - Schedule follow-up appointment in three weeks - Schedule an additional follow-up appointment in six weeks to ensure continuity of care.           She was informed of the importance of frequent follow up visits to maximize her success with intensive lifestyle modifications for her multiple health conditions.    Quillian Quince, MD

## 2024-01-02 ENCOUNTER — Other Ambulatory Visit (INDEPENDENT_AMBULATORY_CARE_PROVIDER_SITE_OTHER): Payer: Self-pay | Admitting: Family Medicine

## 2024-01-02 DIAGNOSIS — I10 Essential (primary) hypertension: Secondary | ICD-10-CM

## 2024-01-05 ENCOUNTER — Other Ambulatory Visit (INDEPENDENT_AMBULATORY_CARE_PROVIDER_SITE_OTHER): Payer: Self-pay | Admitting: Family Medicine

## 2024-01-05 ENCOUNTER — Encounter (INDEPENDENT_AMBULATORY_CARE_PROVIDER_SITE_OTHER): Payer: Self-pay | Admitting: Family Medicine

## 2024-01-05 DIAGNOSIS — I10 Essential (primary) hypertension: Secondary | ICD-10-CM

## 2024-01-06 MED ORDER — AMLODIPINE BESYLATE 5 MG PO TABS: 5.0000 mg | ORAL_TABLET | Freq: Every day | ORAL | 0 refills | Status: DC

## 2024-01-06 NOTE — Telephone Encounter (Signed)
LAST APPOINTMENT DATE: 12/31/2023 NEXT APPOINTMENT DATE: 01/19/2024   [id number] - [place], [place] - [address] AT [address] OF [facility] [address] [place] [address] Phone: [phone] Fax: [phone]  [id number] - [place], [place] - [address] AT [place] [address] [place] [address] Phone: [phone] Fax: [phone]  Patient is requesting a refill of the following medications: Requested Prescriptions   Pending Prescriptions Disp Refills   amLODipine (NORVASC) 5 MG tablet 30 tablet 0    Sig: Take 1 tablet (5 mg total) by mouth daily.    Date last filled: 09/2023 Previously prescribed by [facility]  Lab Results  Component Value Date   HGBA1C 6.5 (H) 08/12/2023   HGBA1C 6.6 (H) 11/26/2021   HGBA1C 6.7 (H) 05/07/2021   Lab Results  Component Value Date   MICROALBUR <0.7 06/08/2020   LDLCALC 86 08/12/2023   CREATININE 0.65 08/12/2023   Lab Results  Component Value Date   VD25OH 22.6 (L) 08/12/2023   VD25OH 25.5 (L) 05/01/2022   VD25OH 51.2 11/26/2021    BP Readings from Last 3 Encounters:  12/31/23 123/73  11/05/23 123/61  10/07/23 (!) 171/82

## 2024-01-11 DIAGNOSIS — R509 Fever, unspecified: Secondary | ICD-10-CM | POA: Diagnosis not present

## 2024-01-11 DIAGNOSIS — J45909 Unspecified asthma, uncomplicated: Secondary | ICD-10-CM | POA: Diagnosis not present

## 2024-01-11 DIAGNOSIS — J988 Other specified respiratory disorders: Secondary | ICD-10-CM | POA: Diagnosis not present

## 2024-01-11 DIAGNOSIS — E1129 Type 2 diabetes mellitus with other diabetic kidney complication: Secondary | ICD-10-CM | POA: Diagnosis not present

## 2024-01-11 DIAGNOSIS — I10 Essential (primary) hypertension: Secondary | ICD-10-CM | POA: Diagnosis not present

## 2024-02-04 ENCOUNTER — Ambulatory Visit (INDEPENDENT_AMBULATORY_CARE_PROVIDER_SITE_OTHER): Payer: Medicare Other | Admitting: Family Medicine

## 2024-02-05 ENCOUNTER — Other Ambulatory Visit (INDEPENDENT_AMBULATORY_CARE_PROVIDER_SITE_OTHER): Payer: Self-pay | Admitting: Family Medicine

## 2024-02-05 DIAGNOSIS — I10 Essential (primary) hypertension: Secondary | ICD-10-CM

## 2024-02-25 ENCOUNTER — Ambulatory Visit (INDEPENDENT_AMBULATORY_CARE_PROVIDER_SITE_OTHER): Payer: Medicare Other | Admitting: Family Medicine

## 2024-02-25 ENCOUNTER — Encounter (INDEPENDENT_AMBULATORY_CARE_PROVIDER_SITE_OTHER): Payer: Self-pay | Admitting: Family Medicine

## 2024-02-25 ENCOUNTER — Other Ambulatory Visit (INDEPENDENT_AMBULATORY_CARE_PROVIDER_SITE_OTHER): Payer: Self-pay | Admitting: Family Medicine

## 2024-02-25 VITALS — BP 118/71 | HR 72 | Temp 98.3°F | Ht 63.0 in | Wt 223.0 lb

## 2024-02-25 DIAGNOSIS — E559 Vitamin D deficiency, unspecified: Secondary | ICD-10-CM

## 2024-02-25 DIAGNOSIS — E669 Obesity, unspecified: Secondary | ICD-10-CM

## 2024-02-25 DIAGNOSIS — Z6839 Body mass index (BMI) 39.0-39.9, adult: Secondary | ICD-10-CM

## 2024-02-25 DIAGNOSIS — E538 Deficiency of other specified B group vitamins: Secondary | ICD-10-CM | POA: Diagnosis not present

## 2024-02-25 DIAGNOSIS — I10 Essential (primary) hypertension: Secondary | ICD-10-CM

## 2024-02-25 DIAGNOSIS — M25569 Pain in unspecified knee: Secondary | ICD-10-CM

## 2024-02-25 DIAGNOSIS — E119 Type 2 diabetes mellitus without complications: Secondary | ICD-10-CM | POA: Diagnosis not present

## 2024-02-25 DIAGNOSIS — Z7984 Long term (current) use of oral hypoglycemic drugs: Secondary | ICD-10-CM | POA: Diagnosis not present

## 2024-02-25 MED ORDER — RYBELSUS 3 MG PO TABS: 3.0000 mg | ORAL_TABLET | ORAL | 0 refills | Status: DC

## 2024-02-25 MED ORDER — CYANOCOBALAMIN 1000 MCG PO TABS: 2000.0000 ug | ORAL_TABLET | Freq: Every day | ORAL | 0 refills | Status: AC

## 2024-02-25 MED ORDER — VITAMIN D (ERGOCALCIFEROL) 1.25 MG (50000 UNIT) PO CAPS
50000.0000 [IU] | ORAL_CAPSULE | ORAL | 0 refills | Status: AC
Start: 2024-02-25 — End: ?

## 2024-02-25 MED ORDER — AMLODIPINE BESYLATE 5 MG PO TABS: 5.0000 mg | ORAL_TABLET | Freq: Every day | ORAL | 0 refills | Status: DC

## 2024-02-25 NOTE — Progress Notes (Signed)
 Office: [phone]  /  Fax: [phone]  WEIGHT SUMMARY AND BIOMETRICS  Anthropometric Measurements Height: 5\' 3"  (1.6 m) Weight: 223 lb (101.2 kg) BMI (Calculated): 39.51 Weight at Last Visit: 219 lb Weight Lost Since Last Visit: 0 lb Weight Gained Since Last Visit: 4 lb Starting Weight: 225 lb Total Weight Loss (lbs): 2 lb (0.907 kg) Peak Weight: 263 lb   Body Composition  Body Fat %: 48.5 % Fat Mass (lbs): 108.4 lbs Muscle Mass (lbs): 109.4 lbs Total Body Water (lbs): 84.2 lbs Visceral Fat Rating : 16   Other Clinical Data Fasting: no Labs: no Today's Visit #: 20 Starting Date: 11/26/21    Chief Complaint: OBESITY   Discussed the use of AI scribe software for clinical note transcription with the patient, who gave verbal consent to proceed.  History of Present Illness   The patient, with obesity, presents to discuss her treatment plan and evaluate her progress.  She has been experiencing difficulty adhering to a structured eating plan and journaling over the past two months, resulting in a weight gain of four pounds since her last visit. She is attempting to increase her physical activity by walking and gardening for approximately thirty minutes three times a week. She mentions a lack of satiety signals, particularly with nighttime snacking. She cannot tolerate Ozempic due to nausea and abdominal pain.  She has type 2 diabetes and is working on improving her diet, increasing exercise, and losing weight. She is currently on metformin XR 500 mg, taking two pills a day. She is not a regular breakfast eater, usually having a snack or a small meal.  She has a history of vitamin D deficiency and is on prescription vitamin D. Her last vitamin D level was low at 22 in August, and she is overdue for lab rechecks. She requests a refill of her vitamin D prescription.  She has a history of B12 deficiency with a goal for her B12 level to be in the five hundreds, but it is  currently at 329. She is on B12 prescriptions, taking two pills of 1000 mcg each per day.  Her hypertension is well controlled with a blood pressure of 118/71. She is on amlodipine 5 mg, atenolol 25 mg, chlorthalidone 25 mg, and losartan 50 mg daily. She experiences occasional fluid retention in her ankles and can predict when her blood pressure is high due to headaches. She had a period of elevated blood pressure a couple of weeks ago, which resolved after a few days.  She has knee osteoarthritis, which limits her ability to exercise due to pain. She has consulted two surgeons with differing opinions on treatment, one recommending total knee replacement and the other suggesting meniscus repair. She has postponed surgery due to concerns about her A1c and blood pressure.          PHYSICAL EXAM:  Blood pressure 118/71, pulse 72, temperature 98.3 F (36.8 C), height 5\' 3"  (1.6 m), weight 223 lb (101.2 kg), SpO2 97%. Body mass index is 39.5 kg/m.  DIAGNOSTIC DATA REVIEWED:  BMET    Component Value Date/Time   NA 142 08/12/2023 1121   K 3.9 08/12/2023 1121   CL 102 08/12/2023 1121   CO2 26 08/12/2023 1121   GLUCOSE 97 08/12/2023 1121   BUN 9 08/12/2023 1121   CREATININE 0.65 08/12/2023 1121   CALCIUM 9.1 08/12/2023 1121   GFRNONAA 87 12/04/2020 0000   GFRAA 105 12/04/2020 0000   Lab Results  Component Value Date  HGBA1C 6.5 (H) 08/12/2023   HGBA1C 6.3 (H) 08/26/2018   Lab Results  Component Value Date   INSULIN 12.2 08/12/2023   INSULIN 14.9 08/26/2018   Lab Results  Component Value Date   TSH 2.160 08/12/2023   CBC    Component Value Date/Time   WBC 7.7 11/26/2021 1316   RBC 4.42 11/26/2021 1316   HGB 12.2 11/26/2021 1316   HCT 38.1 11/26/2021 1316   PLT 300 11/26/2021 1316   MCV 86 11/26/2021 1316   MCH 27.6 11/26/2021 1316   MCHC 32.0 11/26/2021 1316   RDW 13.8 11/26/2021 1316   Iron Studies No results found for: "IRON", "TIBC", "FERRITIN",  "IRONPCTSAT" Lipid Panel     Component Value Date/Time   CHOL 150 08/12/2023 1121   TRIG 88 08/12/2023 1121   HDL 47 08/12/2023 1121   CHOLHDL 3.9 11/26/2021 1316   LDLCALC 86 08/12/2023 1121   Hepatic Function Panel     Component Value Date/Time   PROT 6.7 08/12/2023 1121   ALBUMIN 3.9 08/12/2023 1121   AST 19 08/12/2023 1121   ALT 17 08/12/2023 1121   ALKPHOS 89 08/12/2023 1121   BILITOT 0.4 08/12/2023 1121      Component Value Date/Time   TSH 2.160 08/12/2023 1121   Nutritional Lab Results  Component Value Date   VD25OH 22.6 (L) 08/12/2023   VD25OH 25.5 (L) 05/01/2022   VD25OH 51.2 11/26/2021     Assessment and Plan    Obesity She has gained four pounds over the last two months due to difficulty following a structured eating plan and journaling. She is increasing physical activity through walking and gardening but struggles with nighttime snacking. She cannot tolerate Ozempic due to nausea and abdominal pain. Rybelsus, a GLP-1 receptor agonist in pill form, was discussed as an alternative, with a rare risk of C cell thyroid cancer explained. The importance of taking Rybelsus on an empty stomach to avoid nausea was emphasized. Anticipated weight loss is expected, though less than some injectables. She is advised to focus on nutrition, aiming for 1350 calories and 80 or more grams of protein daily. - Start Rybelsus 3 mg daily, reassess in one month - Focus on nutrition with a target of 1350 calories and 80 or more grams of protein daily - Increase physical activity as tolerated  Type 2 Diabetes Mellitus She is on metformin XR 500 mg, two pills daily. The benefits of GLP-1 receptor agonists like Rybelsus in improving glucose control and potentially lowering A1c were discussed. Initial Rybelsus dose may not significantly impact blood sugar, but higher doses are expected to improve glucose control. She is advised to monitor blood sugar levels and adjust her diet  accordingly. - Continue metformin XR 500 mg, two pills daily - Start Rybelsus 3 mg daily, reassess in one month  Knee Pain Knee pain limits her exercise. Consultations with two knee surgeons yielded differing opinions: one suggests total knee replacement, the other addressing a torn meniscus. Surgery was postponed due to A1c and blood pressure concerns. Weight loss and nutrition are emphasized to alleviate joint pressure. - Focus on weight loss and nutrition to alleviate joint pressure - Reassess surgical options after improving A1c and blood pressure  Hypertension Her blood pressure is well-controlled at 118/71 mmHg. She is on amlodipine 5 mg, atenolol 25 mg, chlorthalidone 25 mg, and losartan 50 mg daily. She experiences occasional fluid retention in her ankles and can predict high blood pressure due to headaches. Amlodipine prescription was refilled. -  Continue current antihypertensive regimen - Refill amlodipine 5 mg prescription  Vitamin D Deficiency She is overdue for lab rechecks. Her last level was low at 22 ng/mL in August. She requests a refill of her vitamin D prescription. - Refill vitamin D prescription - Order lab recheck for vitamin D levels  Vitamin B12 Deficiency Her current B12 level is 329 pg/mL, with a goal of 500 pg/mL. She is on B12 supplementation, 1000 mcg, two pills daily. - Continue B12 supplementation, 1000 mcg, two pills daily - Monitor B12 levels  Follow-up She is advised to follow up in four weeks to reassess her progress with the new treatment plan and make any necessary adjustments. - Schedule follow-up appointment in four weeks         I have personally spent 42 minutes total time today in preparation, patient care, and documentation for this visit, including the following: review of clinical lab tests; review of medical tests/procedures/services.    She was informed of the importance of frequent follow up visits to maximize her success with intensive  lifestyle modifications for her multiple health conditions.    Quillian Quince, MD

## 2024-03-01 ENCOUNTER — Encounter (INDEPENDENT_AMBULATORY_CARE_PROVIDER_SITE_OTHER): Payer: Self-pay

## 2024-03-01 ENCOUNTER — Telehealth (INDEPENDENT_AMBULATORY_CARE_PROVIDER_SITE_OTHER): Payer: Self-pay

## 2024-03-01 NOTE — Telephone Encounter (Signed)
Prior auth submitted via covermymeds. Awaiting determination.

## 2024-03-03 NOTE — Telephone Encounter (Signed)
 Dear Cynthia Short: [facility]  received a request from your provider for coverage of Rybelsus (semaglutide).  As long as you remain covered by the [facility] and there are no changes to your plan benefits, this request is approved for the following time period: 03/01/2024 - 03/02/2027.  Patient notified via my chart.

## 2024-03-07 DIAGNOSIS — E559 Vitamin D deficiency, unspecified: Secondary | ICD-10-CM | POA: Diagnosis not present

## 2024-03-07 DIAGNOSIS — R300 Dysuria: Secondary | ICD-10-CM | POA: Diagnosis not present

## 2024-03-07 DIAGNOSIS — I10 Essential (primary) hypertension: Secondary | ICD-10-CM | POA: Diagnosis not present

## 2024-03-07 DIAGNOSIS — E7800 Pure hypercholesterolemia, unspecified: Secondary | ICD-10-CM | POA: Diagnosis not present

## 2024-03-07 DIAGNOSIS — E039 Hypothyroidism, unspecified: Secondary | ICD-10-CM | POA: Diagnosis not present

## 2024-03-07 DIAGNOSIS — E1165 Type 2 diabetes mellitus with hyperglycemia: Secondary | ICD-10-CM | POA: Diagnosis not present

## 2024-03-07 DIAGNOSIS — E538 Deficiency of other specified B group vitamins: Secondary | ICD-10-CM | POA: Diagnosis not present

## 2024-03-16 DIAGNOSIS — E1165 Type 2 diabetes mellitus with hyperglycemia: Secondary | ICD-10-CM | POA: Diagnosis not present

## 2024-03-16 DIAGNOSIS — E1129 Type 2 diabetes mellitus with other diabetic kidney complication: Secondary | ICD-10-CM | POA: Diagnosis not present

## 2024-03-16 DIAGNOSIS — Z0000 Encounter for general adult medical examination without abnormal findings: Secondary | ICD-10-CM | POA: Diagnosis not present

## 2024-03-16 DIAGNOSIS — I341 Nonrheumatic mitral (valve) prolapse: Secondary | ICD-10-CM | POA: Diagnosis not present

## 2024-03-16 DIAGNOSIS — E538 Deficiency of other specified B group vitamins: Secondary | ICD-10-CM | POA: Diagnosis not present

## 2024-03-16 DIAGNOSIS — I779 Disorder of arteries and arterioles, unspecified: Secondary | ICD-10-CM | POA: Diagnosis not present

## 2024-03-16 DIAGNOSIS — E7800 Pure hypercholesterolemia, unspecified: Secondary | ICD-10-CM | POA: Diagnosis not present

## 2024-03-16 DIAGNOSIS — K219 Gastro-esophageal reflux disease without esophagitis: Secondary | ICD-10-CM | POA: Diagnosis not present

## 2024-03-16 DIAGNOSIS — E559 Vitamin D deficiency, unspecified: Secondary | ICD-10-CM | POA: Diagnosis not present

## 2024-03-16 DIAGNOSIS — M79643 Pain in unspecified hand: Secondary | ICD-10-CM | POA: Diagnosis not present

## 2024-03-16 DIAGNOSIS — E039 Hypothyroidism, unspecified: Secondary | ICD-10-CM | POA: Diagnosis not present

## 2024-03-16 DIAGNOSIS — I10 Essential (primary) hypertension: Secondary | ICD-10-CM | POA: Diagnosis not present

## 2024-03-19 ENCOUNTER — Other Ambulatory Visit (INDEPENDENT_AMBULATORY_CARE_PROVIDER_SITE_OTHER): Payer: Self-pay | Admitting: Family Medicine

## 2024-03-19 DIAGNOSIS — E559 Vitamin D deficiency, unspecified: Secondary | ICD-10-CM

## 2024-03-23 ENCOUNTER — Other Ambulatory Visit (INDEPENDENT_AMBULATORY_CARE_PROVIDER_SITE_OTHER): Payer: Self-pay | Admitting: Family Medicine

## 2024-03-23 DIAGNOSIS — E559 Vitamin D deficiency, unspecified: Secondary | ICD-10-CM

## 2024-03-24 ENCOUNTER — Ambulatory Visit (INDEPENDENT_AMBULATORY_CARE_PROVIDER_SITE_OTHER): Admitting: Family Medicine

## 2024-03-24 ENCOUNTER — Encounter (INDEPENDENT_AMBULATORY_CARE_PROVIDER_SITE_OTHER): Payer: Self-pay | Admitting: Family Medicine

## 2024-03-24 VITALS — BP 123/72 | HR 74 | Temp 98.5°F | Ht 63.0 in | Wt 223.0 lb

## 2024-03-24 DIAGNOSIS — K5792 Diverticulitis of intestine, part unspecified, without perforation or abscess without bleeding: Secondary | ICD-10-CM | POA: Insufficient documentation

## 2024-03-24 DIAGNOSIS — E785 Hyperlipidemia, unspecified: Secondary | ICD-10-CM

## 2024-03-24 DIAGNOSIS — K5900 Constipation, unspecified: Secondary | ICD-10-CM

## 2024-03-24 DIAGNOSIS — Z7984 Long term (current) use of oral hypoglycemic drugs: Secondary | ICD-10-CM

## 2024-03-24 DIAGNOSIS — E119 Type 2 diabetes mellitus without complications: Secondary | ICD-10-CM | POA: Diagnosis not present

## 2024-03-24 DIAGNOSIS — K5790 Diverticulosis of intestine, part unspecified, without perforation or abscess without bleeding: Secondary | ICD-10-CM | POA: Diagnosis not present

## 2024-03-24 DIAGNOSIS — K5901 Slow transit constipation: Secondary | ICD-10-CM

## 2024-03-24 DIAGNOSIS — Z789 Other specified health status: Secondary | ICD-10-CM | POA: Diagnosis not present

## 2024-03-24 DIAGNOSIS — Z794 Long term (current) use of insulin: Secondary | ICD-10-CM | POA: Diagnosis not present

## 2024-03-24 DIAGNOSIS — E669 Obesity, unspecified: Secondary | ICD-10-CM

## 2024-03-24 DIAGNOSIS — E782 Mixed hyperlipidemia: Secondary | ICD-10-CM

## 2024-03-24 DIAGNOSIS — Z6839 Body mass index (BMI) 39.0-39.9, adult: Secondary | ICD-10-CM

## 2024-03-24 NOTE — Progress Notes (Signed)
 Office: [phone]  /  Fax: [phone]  WEIGHT SUMMARY AND BIOMETRICS  Anthropometric Measurements Height: 5\' 3"  (1.6 m) Weight: 223 lb (101.2 kg) BMI (Calculated): 39.51 Weight at Last Visit: 223 lb Weight Lost Since Last Visit: 0 Weight Gained Since Last Visit: 0 Starting Weight: 225 lb Total Weight Loss (lbs): 2 lb (0.907 kg) Peak Weight: 263 lb   Body Composition  Body Fat %: 47.9 % Fat Mass (lbs): 107 lbs Muscle Mass (lbs): 110.4 lbs Total Body Water (lbs): 80.4 lbs Visceral Fat Rating : 16   Other Clinical Data Fasting: no Labs: no Today's Visit #: 21 Starting Date: 11/26/21    Chief Complaint: OBESITY    History of Present Illness Cynthia Short is a 68 year old female who presents for obesity treatment plan assessment and progress evaluation. She is accompanied by her husband, who stayed in the car during the visit.  She is currently on a journaling plan for her obesity treatment, which she follows successfully about 60% of the time. Her weight has remained unchanged from the previous month. 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 She is responsible for preparing food for a birthday party, including making a cake.  She experiences headaches after taking Rybelsus, which occur 15-20 minutes post-ingestion and last for 30-40 minutes. She also reports not consistently feeling full after meals and sometimes feeling overly full later. She has a history of diverticulitis and irritable bowel syndrome, though she is unsure if an official diagnosis was made. She had a colonoscopy 20 years ago, which was normal at that time.  She reports recent constipation, starting Saturday night or Sunday morning, with severe pain on the left side, difficulty sitting, and painful bowel movements. She took Miralax yesterday, which provided marginal improvement, and has been using Slimfast to  manage her symptoms. She has not taken Rybelsus for a couple of days, suspecting it may exacerbate her symptoms. No fever, with a recent temperature reading of 98 degrees.  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 Her back and knees are doing well with this activity, as she takes breaks and manages her tasks to avoid strain.  She experiences postnasal drip and allergies, likely exacerbated by spending time outdoors.      PHYSICAL EXAM:  Blood pressure 123/72, pulse 74, temperature 98.5 F (36.9 C), height 5\' 3"  (1.6 m), weight 223 lb (101.2 kg), SpO2 96%. Body mass index is 39.5 kg/m.  DIAGNOSTIC DATA REVIEWED:  BMET    Component Value Date/Time   NA 142 08/12/2023 1121   K 3.9 08/12/2023 1121   CL 102 08/12/2023 1121   CO2 26 08/12/2023 1121   GLUCOSE 97 08/12/2023 1121   BUN 9 08/12/2023 1121   CREATININE 0.65 08/12/2023 1121   CALCIUM 9.1 08/12/2023 1121   GFRNONAA 87 12/04/2020 0000   GFRAA 105 12/04/2020 0000   Lab Results  Component Value Date   HGBA1C 6.5 (H) 08/12/2023   HGBA1C 6.3 (H) 08/26/2018   Lab Results  Component Value Date   INSULIN 12.2 08/12/2023   INSULIN 14.9 08/26/2018   Lab Results  Component Value Date   TSH 2.160 08/12/2023   CBC    Component Value Date/Time   WBC 7.7 11/26/2021 1316   RBC 4.42 11/26/2021 1316   HGB 12.2 11/26/2021 1316   HCT 38.1  11/26/2021 1316   PLT 300 11/26/2021 1316   MCV 86 11/26/2021 1316   MCH 27.6 11/26/2021 1316   MCHC 32.0 11/26/2021 1316   RDW 13.8 11/26/2021 1316   Iron Studies No results found for: "IRON", "TIBC", "FERRITIN", "IRONPCTSAT" Lipid Panel     Component Value Date/Time   CHOL 150 08/12/2023 1121   TRIG 88 08/12/2023 1121   HDL 47 08/12/2023 1121   CHOLHDL 3.9 11/26/2021 1316   LDLCALC 86 08/12/2023 1121   Hepatic Function Panel     Component Value Date/Time   PROT 6.7 08/12/2023 1121   ALBUMIN 3.9  08/12/2023 1121   AST 19 08/12/2023 1121   ALT 17 08/12/2023 1121   ALKPHOS 89 08/12/2023 1121   BILITOT 0.4 08/12/2023 1121      Component Value Date/Time   TSH 2.160 08/12/2023 1121   Nutritional Lab Results  Component Value Date   VD25OH 22.6 (L) 08/12/2023   VD25OH 25.5 (L) 05/01/2022   VD25OH 51.2 11/26/2021     Assessment and Plan Assessment & Plan Type 2 Diabetes Mellitus Her A1c is 7.0, indicating well-managed diabetes. Rybelsus may offer benefits but continuation is under consideration due to side effects. Advised to follow a low-carb, low-sugar diet and increase physical activity. - Continue low-carb, low-sugar diet - Encourage regular physical activity - Discuss potential continuation of Rybelsus at next appointment  Obesity She adheres to a journaling plan for weight management about 60% of the time. Engages in physical activity through yard work, but weight remains unchanged. Experiences headaches after taking Rybelsus, likely related to dehydration. Rybelsus may exacerbate constipation due to its mechanism of slowing down the GI tract. - Continue journaling plan - Encourage physical activity - Consider daily Miralax to prevent constipation - Monitor hydration status - Discuss potential continuation of Rybelsus at next appointment  Hyperlipidemia Her LDL cholesterol is elevated, increasing cardiovascular risk, especially with diabetes. Statins can lower cholesterol but previously caused muscle pain, likely due to CoQ10 depletion. CoQ10 supplementation may mitigate these side effects. - Consider CoQ10 supplementation (300-400 mg daily) - Discuss potential reintroduction of statins with CoQ10 supplementation at next appointment - Consider referral to cardiologist if statin intolerance persists  Statin Intolerance Previously experienced muscle pain with statins, likely due to CoQ10 depletion. CoQ10 supplementation may allow for reintroduction of statins without  side effects. - Consider CoQ10 supplementation (300-400 mg daily) - Discuss potential reintroduction of statins with CoQ10 supplementation at next appointment  Constipation Experiences constipation, potentially exacerbated by Rybelsus. Rybelsus slows the GI tract, leading to less frequent and more difficult bowel movements. Miralax can help maintain bowel regularity by retaining water in the colon. - Take Miralax daily to prevent constipation - Monitor bowel movements and adjust Miralax as needed  Diverticulosis Diverticulosis predisposes her to diverticulitis. Current symptoms align more with constipation. Maintaining bowel regularity is crucial to prevent complications. - Maintain bowel regularity with Miralax - Ensure adequate fiber intake  General Health Maintenance Recent labs show well-managed liver function and electrolytes. Improved microalbumin ratio indicates no current kidney damage from diabetes. Emphasized the importance of hydration and regular monitoring of blood glucose and cholesterol levels. - Encourage adequate hydration - Continue regular monitoring of blood glucose and cholesterol levels  Follow-up She will follow up in four weeks to reassess the current treatment plan and discuss the continuation of Rybelsus and potential reintroduction of statins. - Schedule follow-up appointment in four weeks      She was informed of the importance of frequent follow  up visits to maximize her success with intensive lifestyle modifications for her multiple health conditions.    Quillian Quince, MD

## 2024-03-28 DIAGNOSIS — H353131 Nonexudative age-related macular degeneration, bilateral, early dry stage: Secondary | ICD-10-CM | POA: Diagnosis not present

## 2024-04-11 ENCOUNTER — Other Ambulatory Visit (INDEPENDENT_AMBULATORY_CARE_PROVIDER_SITE_OTHER): Payer: Self-pay | Admitting: Family Medicine

## 2024-04-11 DIAGNOSIS — E559 Vitamin D deficiency, unspecified: Secondary | ICD-10-CM

## 2024-04-29 ENCOUNTER — Other Ambulatory Visit (INDEPENDENT_AMBULATORY_CARE_PROVIDER_SITE_OTHER): Payer: Self-pay | Admitting: Family Medicine

## 2024-04-29 DIAGNOSIS — E559 Vitamin D deficiency, unspecified: Secondary | ICD-10-CM

## 2024-05-04 ENCOUNTER — Ambulatory Visit (INDEPENDENT_AMBULATORY_CARE_PROVIDER_SITE_OTHER): Admitting: Family Medicine

## 2024-06-01 ENCOUNTER — Ambulatory Visit (INDEPENDENT_AMBULATORY_CARE_PROVIDER_SITE_OTHER): Admitting: Family Medicine

## 2024-06-01 ENCOUNTER — Encounter (INDEPENDENT_AMBULATORY_CARE_PROVIDER_SITE_OTHER): Payer: Self-pay | Admitting: Family Medicine

## 2024-06-01 VITALS — BP 126/77 | HR 63 | Temp 98.0°F | Ht 63.0 in | Wt 227.0 lb

## 2024-06-01 DIAGNOSIS — Z794 Long term (current) use of insulin: Secondary | ICD-10-CM | POA: Diagnosis not present

## 2024-06-01 DIAGNOSIS — Z7984 Long term (current) use of oral hypoglycemic drugs: Secondary | ICD-10-CM | POA: Diagnosis not present

## 2024-06-01 DIAGNOSIS — E119 Type 2 diabetes mellitus without complications: Secondary | ICD-10-CM

## 2024-06-01 DIAGNOSIS — R29898 Other symptoms and signs involving the musculoskeletal system: Secondary | ICD-10-CM

## 2024-06-01 DIAGNOSIS — E538 Deficiency of other specified B group vitamins: Secondary | ICD-10-CM | POA: Diagnosis not present

## 2024-06-01 DIAGNOSIS — E1169 Type 2 diabetes mellitus with other specified complication: Secondary | ICD-10-CM

## 2024-06-01 DIAGNOSIS — E669 Obesity, unspecified: Secondary | ICD-10-CM

## 2024-06-01 DIAGNOSIS — Z6839 Body mass index (BMI) 39.0-39.9, adult: Secondary | ICD-10-CM

## 2024-06-01 DIAGNOSIS — E559 Vitamin D deficiency, unspecified: Secondary | ICD-10-CM | POA: Diagnosis not present

## 2024-06-01 DIAGNOSIS — Z6841 Body Mass Index (BMI) 40.0 and over, adult: Secondary | ICD-10-CM

## 2024-06-01 DIAGNOSIS — I779 Disorder of arteries and arterioles, unspecified: Secondary | ICD-10-CM | POA: Diagnosis not present

## 2024-06-01 DIAGNOSIS — K760 Fatty (change of) liver, not elsewhere classified: Secondary | ICD-10-CM | POA: Diagnosis not present

## 2024-06-01 MED ORDER — VITAMIN D (ERGOCALCIFEROL) 1.25 MG (50000 UNIT) PO CAPS: 50000.0000 [IU] | ORAL_CAPSULE | ORAL | 0 refills | Status: DC

## 2024-06-01 MED ORDER — METFORMIN HCL ER 500 MG PO TB24: 500.0000 mg | ORAL_TABLET | Freq: Two times a day (BID) | ORAL | 0 refills | Status: DC

## 2024-06-01 NOTE — Progress Notes (Signed)
 Office: [phone]  /  Fax: [phone]  WEIGHT SUMMARY AND BIOMETRICS  Anthropometric Measurements Height: 5' 3 (1.6 m) Weight: 227 lb (103 kg) BMI (Calculated): 40.22 Weight at Last Visit: 223 lb Weight Lost Since Last Visit: 0 Weight Gained Since Last Visit: 4 lb Starting Weight: 225 lb Total Weight Loss (lbs): 0 lb (0 kg) Peak Weight: 262 lb   Body Composition  Body Fat %: 48.9 % Fat Mass (lbs): 111.2 lbs Muscle Mass (lbs): 110.6 lbs Total Body Water (lbs): 83 lbs Visceral Fat Rating : 16   Other Clinical Data Fasting: no Labs: no Today's Visit #: 22 Starting Date: 11/26/21    Chief Complaint: OBESITY    History of Present Illness Cynthia Short is a 68 year old female with obesity and type 2 diabetes who presents for obesity treatment plan assessment and progress evaluation.  She has been following a journaling plan with a goal of 1200 calories and 75 grams of protein daily, but journals only about 50% of the time and does not meet her protein goals. She has gained four pounds in the last two months. For exercise, she engages in yard work for about an hour, three times a week.  She has a history of type 2 diabetes and is currently on metformin  500 mg BID. She has previously tried GLP-1 medications such as Ozempic  and Rybelsus  but experienced gastrointestinal intolerance and headaches, respectively. Her last A1c was 7%.  She has a history of vitamin D  deficiency and is on prescription vitamin D , requesting a refill. Additionally, she has a history of B12 deficiency and is currently taking 2000 mcg of B12 orally.  She reports bilateral hand weakness and range of motion limitation at the base of her fingers, with some tingling. This started suddenly around March and has been intermittent. She describes the pain as 'stabbing and burning' and notes that it interferes with her daily activities, such as opening jars and doing crafts. Blood work for autoimmune  conditions and rheumatoid arthritis was normal. She has been more consistent with her B12 intake over the last six weeks.      PHYSICAL EXAM:  Blood pressure 126/77, pulse 63, temperature 98 F (36.7 C), height 5' 3 (1.6 m), weight 227 lb (103 kg), SpO2 98%. Body mass index is 40.21 kg/m.  DIAGNOSTIC DATA REVIEWED:  BMET    Component Value Date/Time   NA 142 08/12/2023 1121   K 3.9 08/12/2023 1121   CL 102 08/12/2023 1121   CO2 26 08/12/2023 1121   GLUCOSE 97 08/12/2023 1121   BUN 9 08/12/2023 1121   CREATININE 0.65 08/12/2023 1121   CALCIUM 9.1 08/12/2023 1121   GFRNONAA 87 12/04/2020 0000   GFRAA 105 12/04/2020 0000   Lab Results  Component Value Date   HGBA1C 6.5 (H) 08/12/2023   HGBA1C 6.3 (H) 08/26/2018   Lab Results  Component Value Date   INSULIN  12.2 08/12/2023   INSULIN  14.9 08/26/2018   Lab Results  Component Value Date   TSH 2.160 08/12/2023   CBC    Component Value Date/Time   WBC 7.7 11/26/2021 1316   RBC 4.42 11/26/2021 1316   HGB 12.2 11/26/2021 1316   HCT 38.1 11/26/2021 1316   PLT 300 11/26/2021 1316   MCV 86 11/26/2021 1316   MCH 27.6 11/26/2021 1316   MCHC 32.0 11/26/2021 1316   RDW 13.8 11/26/2021 1316   Iron Studies No results found for: IRON, TIBC, FERRITIN, IRONPCTSAT Lipid Panel  Component Value Date/Time   CHOL 150 08/12/2023 1121   TRIG 88 08/12/2023 1121   HDL 47 08/12/2023 1121   CHOLHDL 3.9 11/26/2021 1316   LDLCALC 86 08/12/2023 1121   Hepatic Function Panel     Component Value Date/Time   PROT 6.7 08/12/2023 1121   ALBUMIN 3.9 08/12/2023 1121   AST 19 08/12/2023 1121   ALT 17 08/12/2023 1121   ALKPHOS 89 08/12/2023 1121   BILITOT 0.4 08/12/2023 1121      Component Value Date/Time   TSH 2.160 08/12/2023 1121   Nutritional Lab Results  Component Value Date   VD25OH 22.6 (L) 08/12/2023   VD25OH 25.5 (L) 05/01/2022   VD25OH 51.2 11/26/2021     Assessment and Plan Assessment &  Plan Obesity She is struggling with weight management, having gained four pounds in the last two months. Currently on a 1200 calorie diet with 75 grams of protein but not consistently meeting protein goals, potentially affecting her resting metabolic rate. Engages in yard work as exercise three times a week. Previous GLP-1 medications (Ozempic  and Rybelsus ) caused gastrointestinal intolerance and headaches. She is seeking additional support for weight loss. - Implement a low carbohydrate diet plan focusing on foods that do not cause an insulin  response to aid in weight loss and blood sugar control. - Educate on the importance of hydration while on the low carbohydrate diet to prevent dehydration and headaches. - Discuss potential future use of Mounjaro if dietary changes do not yield results, starting with a low dose due to previous side effects with GLP-1 medications.  Type 2 Diabetes Mellitus Diabetes is managed with metformin  500 mg BID. A1c is 7.0, but she expresses concern about maintaining control. Previous attempts with GLP-1 medications were not well tolerated. Mounjaro has fewer GI side effects than Ozempic  but requires cautious use due to her history of intolerance. - Refill metformin  500 mg BID. - Monitor blood glucose levels and A1c. - Consider Mounjaro in the future if dietary changes are insufficient and if she is willing to try it despite previous side effects with similar medications.  Bilateral Hand Weakness and Range of Motion Limitation Reports bilateral hand weakness and limited range of motion at the base of her fingers, with intermittent tingling. Symptoms began suddenly in March and have persisted. Previous evaluations ruled out autoimmune conditions and rheumatoid arthritis. Possibility of carpal tunnel syndrome or nerve compression, but further evaluation is needed. Symptoms interfere with daily activities and crafts. - Refer to Dr. Alease Hunter, a sports medicine specialist, for  further evaluation and potential treatment options.  Vitamin D  Deficiency Vitamin D  deficiency is managed with 5000 IU of over-the-counter vitamin D  daily. Previous lab results indicated low levels, and a recheck is warranted to assess current status. - Check vitamin D  levels. - Continue vitamin D  supplementation at 5000 IU daily.  Vitamin B12 Deficiency Vitamin B12 deficiency is managed with 2000 mcg of oral B12. Previous lab results showed levels drifting low, and a recheck is needed to determine current status and absorption efficacy. If levels remain low, consider sublingual or injectable B12 due to potential absorption issues. - Check vitamin B12 levels. - Continue oral B12 supplementation at 2000 mcg daily.    She was informed of the importance of frequent follow up visits to maximize her success with intensive lifestyle modifications for her multiple health conditions.    Jasmine Mesi, MD

## 2024-06-02 LAB — CMP14+EGFR
ALT: 17 IU/L (ref 0–32)
AST: 16 IU/L (ref 0–40)
Albumin: 4.2 g/dL (ref 3.9–4.9)
Alkaline Phosphatase: 90 IU/L (ref 44–121)
BUN/Creatinine Ratio: 21 (ref 12–28)
BUN: 14 mg/dL (ref 8–27)
Bilirubin Total: 0.3 mg/dL (ref 0.0–1.2)
CO2: 23 mmol/L (ref 20–29)
Calcium: 9.4 mg/dL (ref 8.7–10.3)
Chloride: 100 mmol/L (ref 96–106)
Creatinine, Ser: 0.67 mg/dL (ref 0.57–1.00)
Globulin, Total: 2.8 g/dL (ref 1.5–4.5)
Glucose: 107 mg/dL — ABNORMAL HIGH (ref 70–99)
Potassium: 4.3 mmol/L (ref 3.5–5.2)
Sodium: 140 mmol/L (ref 134–144)
Total Protein: 7 g/dL (ref 6.0–8.5)
eGFR: 96 mL/min/{1.73_m2} (ref >59)

## 2024-06-02 LAB — VITAMIN B12: Vitamin B-12: 1733 pg/mL — ABNORMAL HIGH (ref 232–1245)

## 2024-06-02 LAB — HEMOGLOBIN A1C
Est. average glucose Bld gHb Est-mCnc: 160 mg/dL
Hgb A1c MFr Bld: 7.2 % — ABNORMAL HIGH (ref 4.8–5.6)

## 2024-06-02 LAB — VITAMIN D 25 HYDROXY (VIT D DEFICIENCY, FRACTURES): Vit D, 25-Hydroxy: 35.9 ng/mL (ref 30.0–100.0)

## 2024-06-04 ENCOUNTER — Encounter (INDEPENDENT_AMBULATORY_CARE_PROVIDER_SITE_OTHER): Payer: Self-pay | Admitting: Family Medicine

## 2024-06-07 ENCOUNTER — Other Ambulatory Visit: Payer: Self-pay

## 2024-06-07 ENCOUNTER — Ambulatory Visit (INDEPENDENT_AMBULATORY_CARE_PROVIDER_SITE_OTHER)

## 2024-06-07 ENCOUNTER — Ambulatory Visit (INDEPENDENT_AMBULATORY_CARE_PROVIDER_SITE_OTHER): Admitting: Family Medicine

## 2024-06-07 VITALS — BP 136/80 | HR 83 | Ht 63.0 in | Wt 229.0 lb

## 2024-06-07 DIAGNOSIS — M778 Other enthesopathies, not elsewhere classified: Secondary | ICD-10-CM | POA: Diagnosis not present

## 2024-06-07 DIAGNOSIS — M79641 Pain in right hand: Secondary | ICD-10-CM | POA: Diagnosis not present

## 2024-06-07 DIAGNOSIS — M79642 Pain in left hand: Secondary | ICD-10-CM

## 2024-06-07 DIAGNOSIS — M1812 Unilateral primary osteoarthritis of first carpometacarpal joint, left hand: Secondary | ICD-10-CM | POA: Diagnosis not present

## 2024-06-07 DIAGNOSIS — M1811 Unilateral primary osteoarthritis of first carpometacarpal joint, right hand: Secondary | ICD-10-CM | POA: Diagnosis not present

## 2024-06-07 NOTE — Progress Notes (Unsigned)
   Cynthia Ileana Collet, PhD, LAT, ATC acting as a scribe for Artist Lloyd, MD.  Cynthia Short is a 68 y.o. female who presents to [facility] at [facility] today for bilat hand pain ongoing intermittently since March. Pain is varying, started in the L, resolved, move to the R, resolved, now in both hands. Pt locates pain to across the heads of the [facility] and IP joints of all fingers. She also notes trigger of the 3rd-4th fingers bilaterally and limited finger flexion.   Paresthesia: no, only when she sleeps a certain way Grip strength: Aggravates: opening jars, doing crafts, dryer her hand, holding a knife Treatments tried: B12, tylenol, ice  Dx testing: 03/20/21 R hand XR  Pertinent review of systems: No fevers or chills  Relevant historical information: Hypertension and diabetes.   Exam:  BP 136/80   Pulse 83   Ht 5' 3 (1.6 m)   Wt 229 lb (103.9 kg)   SpO2 94%   BMI 40.57 kg/m  General: Well Developed, well nourished, and in no acute distress.   MSK: Hands bilaterally some swelling across MCPs bilaterally.  Tender to palpation mildly.  Reduced flexion range of motion at MCPs especially 4th and 5th bilaterally.  No definitive triggering is felt today.    Lab and Radiology Results  X-ray images bilateral hands obtained today personally and independently interpreted.  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  No bony erosions are visible across MCPs.  Patient does have DJD also at first [facility] and at the radiocarpal joint.  No acute fractures are visible.  Patient does have erosions at the left second PIP  Await formal radiology review   Assessment and Plan: 68 y.o. female with bilateral hand pain.  She does have DJD on x-ray.  She does have more MCP synovitis that I would expect.  She reportedly had some rheumatologic evaluation at her PCP office recently that was normal.  Unfortunately do not have records.  Will get records of her recent rheumatologic  workup and expanded if needed.  For now we will refer to hand therapy and recommend Voltaren gel.   PDMP not reviewed this encounter. Orders Placed This Encounter  Procedures   DG Hand Complete Left    Standing Status:   Future    Number of Occurrences:   1    Expiration Date:   07/07/2024    Reason for Exam (SYMPTOM  OR DIAGNOSIS REQUIRED):   bilateral hand pain    Preferred imaging location?:   [place]   DG Hand Complete Right    Standing Status:   Future    Number of Occurrences:   1    Expiration Date:   07/07/2024    Reason for Exam (SYMPTOM  OR DIAGNOSIS REQUIRED):   bilateral hand pain    Preferred imaging location?:   [facility]   Ambulatory referral to Occupational Therapy    Referral Priority:   Routine    Referral Type:   Occupational Therapy    Referral Reason:   Specialty Services Required    Requested Specialty:   Occupational Therapy    Number of Visits Requested:   1   No orders of the defined types were placed in this encounter.    Discussed warning signs or symptoms. Please see discharge instructions. Patient expresses understanding.   The above documentation has been reviewed and is accurate and complete Artist Lloyd, M.D.

## 2024-06-07 NOTE — Patient Instructions (Addendum)
 Thank you for coming in today.   Please get an Xray today before you leave   Please use Voltaren gel (Generic Diclofenac Gel) up to 4x daily for pain as needed.  This is available over-the-counter as both the name brand Voltaren gel and the generic diclofenac gel.   I've referred you to Hand Therapy.  Let us  know if you don't hear from them in one week.   We will get records from [place]  Check back in 1 month

## 2024-06-14 ENCOUNTER — Ambulatory Visit: Payer: Self-pay | Admitting: Family Medicine

## 2024-06-14 NOTE — Progress Notes (Signed)
 Left hand x-ray shows medium arthritis at the base of the thumb and a little bit of arthritis at the middle finger middle joint.

## 2024-06-14 NOTE — Progress Notes (Signed)
 Right hand x-ray shows medium to severe arthritis at the base of the thumb.

## 2024-06-21 NOTE — Therapy (Signed)
 OUTPATIENT OCCUPATIONAL THERAPY ORTHO EVALUATION  Patient Name: Cynthia Short MRN: [id number] DOB:1956-12-10, 68 y.o., female Today's Date: 06/22/2024  PCP: Hugh HAMS MD REFERRING PROVIDER: Joane Artist RAMAN, MD   END OF SESSION:  OT End of Session - 06/22/24 1259     Visit Number 1    Number of Visits 10    Date for OT Re-Evaluation 08/05/24    Authorization Type [employer] Medicare    OT Start Time 1300    OT Stop Time 1355    OT Time Calculation (min) 55 min    Activity Tolerance Patient tolerated treatment well;No increased pain;Patient limited by fatigue;Patient limited by pain    Behavior During Therapy [facility] for tasks assessed/performed          Past Medical History:  Diagnosis Date   Anemia    Anxiety    Arthritis    Asthma    B12 deficiency    Chest pain    Constipation    Diabetes mellitus    Dyspnea    Gallbladder problem    GERD (gastroesophageal reflux disease)    Hair loss    Heart murmur    Heart valve problem    Hiatal hernia    Hip problem    Hyperlipemia    Hypertension    Hypothyroidism    IBS (irritable bowel syndrome)    Joint pain    Knee problem    Leg edema    Osteoarthritis    Thyroid  disease    hypothyroidism   Tricuspid valve disorder    Vitamin D  deficiency    Past Surgical History:  Procedure Laterality Date   ABDOMINAL HYSTERECTOMY  3/98   CHOLECYSTECTOMY  02/2006   KNEE SURGERY     MYOMECTOMY  12/93   uterine fibroids   TMJ ARTHROPLASTY  8/82   Patient Active Problem List   Diagnosis Date Noted   Diverticulitis 03/24/2024   Statin intolerance 03/24/2024   Localized edema 11/05/2023   Microscopic hematuria 09/10/2023   Recurrent UTI 09/10/2023   Mixed hyperlipidemia 08/12/2023   Stress 07/14/2023   Other constipation 03/12/2023   Cough 02/10/2023   BMI 39.0-39.9,adult 02/10/2023   Obesity, Beginning BMI 39.86 02/10/2023   Other insomnia 09/09/2022   Essential hypertension 08/06/2022   Vitamin B 12 deficiency  08/06/2022   Diabetes mellitus (HCC) 07/09/2022   Vitamin D  deficiency 05/01/2022   B12 deficiency 05/01/2022   Asthma 10/17/2021   Heart murmur 10/17/2021   Hypothyroidism 10/17/2021   Hiatal hernia 10/17/2021   Dyspnea on exertion 07/15/2012   Class 2 severe obesity with serious comorbidity and body mass index (BMI) of 39.0 to 39.9 in adult (HCC) 07/15/2012   GERD (gastroesophageal reflux disease) 07/15/2012   Type 2 diabetes mellitus with obesity (HCC) 07/15/2012   Hypertension 07/15/2012    ONSET DATE: Approx 3-4 month onset of bil had pain   REFERRING DIAG: M79.641,M79.642 (ICD-10-CM) - Bilateral hand pain   THERAPY DIAG:  Pain in right hand - Plan: Ot plan of care cert/re-cert  Localized edema - Plan: Ot plan of care cert/re-cert  Muscle weakness (generalized) - Plan: Ot plan of care cert/re-cert  Other lack of coordination - Plan: Ot plan of care cert/re-cert  Pain in left hand - Plan: Ot plan of care cert/re-cert  Paresthesia of skin - Plan: Ot plan of care cert/re-cert  Rationale for Evaluation and Treatment: Rehabilitation  SUBJECTIVE:   SUBJECTIVE STATEMENT: She states 3-4 month onset of multiple issues including pain starting  in left hand, moving over to the right hand with unknown activities.   Gets some numbness in the night, has locking in digits 4 and 5 bilaterally.  She also states a history of some other issues like diabetes and vitamin deficiencies and a family history of similar issues.   PERTINENT HISTORY:  Per MD note bilat hand pain ongoing intermittently since March. Pain is varying, started in the L, resolved, move to the R, resolved, now in both hands. Pt locates pain to across the heads of the [facility] and IP joints of all fingers. She also notes trigger of the 3rd-4th fingers bilaterally and limited finger flexion.   PRECAUTIONS: None  RED FLAGS: None   WEIGHT BEARING RESTRICTIONS: No  PAIN:  Are you having pain? Yes: NPRS scale: 0/10 at rest,  at worst in past week up to 8-9/10 opening jars, etc.  Pain location: Bilateral MCP joints especially digits 4 and 5 Pain description: Aching and sometimes sharp Aggravating factors: Tight gripping Relieving factors: Rest  FALLS: Has patient fallen in last 6 months? No  LIVING ENVIRONMENT: Lives with: lives with their family Lives in: House/apartment Has following equipment at home: None  PLOF: Independent  PATIENT GOALS: To improve bilateral hand use and pain syndromes  NEXT MD VISIT: As needed    OBJECTIVE: (All objective assessments below are from initial evaluation on: 06/22/24 unless otherwise specified.)   HAND DOMINANCE: Right   ADLs: Overall ADLs: States decreased ability to grab, hold household objects, pain and difficulty to open containers, perform FMS tasks (manipulate fasteners on clothing), mild to moderate bathing problems as well.    FUNCTIONAL OUTCOME MEASURES: Eval: Patient Specific Functional Scale: 3.75 (opening jars, drying hair, grasping objects, writing)  (Higher Score  =  Better Ability for the Selected Tasks)       UPPER EXTREMITY ROM     Shoulder to Wrist AROM Right eval Left eval  Shoulder flexion    Shoulder abduction    Shoulder extension    Shoulder internal rotation    Shoulder external rotation    Elbow flexion    Elbow extension    Forearm supination    Forearm pronation     Wrist flexion 66 60  Wrist extension 60 60  Wrist ulnar deviation    Wrist radial deviation    Functional dart thrower's motion (F-DTM) in ulnar flexion    F-DTM in radial extension     (Blank rows = not tested)  Evaluation: Right hand ring finger was considered the most stiff and most painful, so that was taken as a measure to be tracked throughout therapy sessions to look for improvement.  There was no time to measure all of her stiff fingers today during eval.  (This is also unnecessary.)  Hand AROM Right eval  Full Fist Ability (or Gap to Distal Palmar  Crease) Unable due to pain and stiffness and triggering  Thumb Opposition  (Kapandji Scale)    Thumb MCP (0-60)   Thumb IP (0-80)   Thumb Radial Abduction Span   Thumb Palmar Abduction Span   Index MCP (0-90)   Index PIP (0-100)   Index DIP (0-70)    Long MCP (0-90)    Long PIP (0-100)    Long DIP (0-70)    Ring MCP (0-90) 0- 57   Ring PIP (0-100) 0-  84  Ring DIP (0-70) 0-  27  Little MCP (0-90)    Little PIP (0-100)    Little DIP (0-70)    (  Blank rows = not tested)   HAND FUNCTION: Eval: Observed weakness in affected bil hand due to painful triggering of fingers.  Details will be tested in upcoming sessions as tolerated.  Grip strength Right: TBD lbs, Left: TBD lbs   COORDINATION: Eval: Observed coordination impairments with affected bilateral hands as seen by painful triggering of fingers and stiffness.  Details will be tested as tolerated in upcoming sessions  SENSATION: Eval:  Light touch intact today, though some complaints of ulnar nerve paresthesia bilaterally and she had a positive elbow flexion test for cubital tunnel compression.  Approximately 2 seconds until affected  EDEMA:   Eval: Mildly swollen in bilateral MCP joints of both hands, slightly red, tender volarly especially at digits 4 and 5 bilaterally  COGNITION: Eval: Overall cognitive status: WFL for evaluation today   OBSERVATIONS:   Eval: Seems like trigger finger in digits 4 and 5 bilaterally, worse in the right hand.  Some mild underlying MCP joint arthritis.  Stiffness largely due to learned behaviors and disuse after triggering.  Also some ulnar nerve compression at the cubital tunnel bilaterally due to sleep postures   TODAY'S TREATMENT:  Post-evaluation treatment:   Today she was given initial self-care/safety recommendations to manage nerve paresthesias, triggering digits, and pain in the MCP joints thought to be arthritis.  She was given recommendations like getting compression gloves, wearing  Band-Aids to manage triggering, changing activities and routines, etc.  She was also given the following home exercise program including exercises, activities and neuromuscular reeducation techniques to help relieve symptoms.  She should do these things without added pain, they were done with her today to ensure understanding and nonpainful performance.  Exercises - Wrist Flexion Stretch  - 4 x daily - 2-3 reps - 15 sec hold - Wrist Prayer Stretch  - 4 x daily - 3-5 reps - 15 sec hold - BACK KNUCKLE STRETCHES   - 4 x daily - 3-5 reps - 15 sec hold - HOOK Stretch  - 4 x daily - 3-5 reps - 15-20 sec hold - Ulnar Nerve Flossing  - 3-4 x daily - 1-2 sets - 5-10 reps    PATIENT EDUCATION: Education details: See tx section above for details  Person educated: Patient Education method: Verbal Instruction, Teach back, Handouts  Education comprehension: States and demonstrates understanding, Additional Education required    HOME EXERCISE PROGRAM: Access Code: [email or web address] URL: [email or web address] Date: 06/22/2024 Prepared by: Melvenia Ada   GOALS: Goals reviewed with patient? Yes   SHORT TERM GOALS: (STG required if POC>30 days) Target Date: 07/01/24  Pt will obtain protective, custom orthotic. Goal status: TBD/PRN  2.  Pt will demo/state understanding of initial HEP to improve pain levels and prerequisite motion. Goal status: INITIAL   LONG TERM GOALS: Target Date: 08/05/24  Pt will improve functional ability by decreased impairment per PSFS assessment from 3.75 to 6 or better, for better quality of life. Goal status: INITIAL  2.  Pt will improve grip strength in bilateral hands to at least nonpainful 35 lbs for functional use at home and in IADLs. Goal status: INITIAL  3.  Pt will improve A/ROM in Rt RF TAM from 168* to at least 200*, to have functional motion for tasks like reach and grasp.  Goal status: INITIAL  4.  Pt will decrease pain at worst from 8-9/10  to 3/10 or better to have better sleep and occupational participation in daily roles. Goal status: INITIAL   ASSESSMENT:  CLINICAL IMPRESSION: Patient  is a 68 y.o. female who was seen today for occupational therapy evaluation for bilateral hand pain that is mostly triggering digits 4 and 5, but also having some ulnar nerve paresthesia at the cubital tunnel as well as some underlying arthritis mainly in the MCP joints.  These issues because stiffness, weakness, decreased functional ability.  The patient will benefit from outpatient occupational therapy to decrease symptoms, improve functional upper extremity use, and increase quality of life.  PERFORMANCE DEFICITS: in functional skills including ADLs, IADLs, coordination, dexterity, ROM, strength, pain, fascial restrictions, Fine motor control, body mechanics, endurance, decreased knowledge of precautions, and UE functional use, cognitive skills including problem solving and safety awareness, and psychosocial skills including coping strategies, environmental adaptation, habits, and routines and behaviors.   IMPAIRMENTS: are limiting patient from ADLs, IADLs, rest and sleep, and leisure.   COMORBIDITIES: may have co-morbidities  that affects occupational performance. Patient will benefit from skilled OT to address above impairments and improve overall function.  MODIFICATION OR ASSISTANCE TO COMPLETE EVALUATION: Min-Moderate modification of tasks or assist with assess necessary to complete an evaluation.  OT OCCUPATIONAL PROFILE AND HISTORY: Detailed assessment: Review of records and additional review of physical, cognitive, psychosocial history related to current functional performance.  CLINICAL DECISION MAKING: Moderate - several treatment options, min-mod task modification necessary  REHAB POTENTIAL: Excellent  EVALUATION COMPLEXITY: Moderate      PLAN:  OT FREQUENCY: 1-2x/week  OT DURATION: 6 weeks through 08/05/2024 and up to 10  total visits as needed   PLANNED INTERVENTIONS: 97535 self care/ADL training, 02889 therapeutic exercise, 97530 therapeutic activity, 97112 neuromuscular re-education, 97140 manual therapy, 97035 ultrasound, 97032 electrical stimulation (manual), 97760 Orthotic Initial, [id number] Orthotic/Prosthetic subsequent, compression bandaging, Dry needling, energy conservation, coping strategies training, and patient/family education  RECOMMENDED OTHER SERVICES: none now    CONSULTED AND AGREED WITH PLAN OF CARE: Patient  PLAN FOR NEXT SESSION:   Review initial HEP and recommendations    Melvenia Ada, OTR/L, CHT  06/22/2024, 2:11 PM    Date of referral: 06/07/24 Referring provider: Dr. Artist Lloyd Referring diagnosis? [id number] (ICD-10-CM) - Bilateral hand pain  Treatment diagnosis? (if different than referring diagnosis) Pain in right hand [M79.641] , Pain in left hand [M79.642]   What was this (referring dx) caused by? Ongoing Issue and Arthritis  Lysle of Condition: Initial Onset (within last 3 months)   Laterality: Both  Current Functional Measure Score: Patient Specific Functional Scale 3.75  Objective measurements identify impairments when they are compared to normal values, the uninvolved extremity, and prior level of function.  [x]  Yes  []  No  Objective assessment of functional ability: Moderate functional limitations   Briefly describe symptoms: Triggering in bilateral hands and pain, some paresthesia, stiffness and inability to make a fist or do daily tasks well  How did symptoms start: Triggering and pain in the left hand that moved over to the right hand  Average pain intensity:  Last 24 hours: 5-6/10  Past week: 5-6/10  How often does the pt experience symptoms? Frequently  How much have the symptoms interfered with usual daily activities? Moderately  How has condition changed since care began at this facility? NA - initial visit  In general, how is the patients  overall health? Good   BACK PAIN (STarT Back Screening Tool) No

## 2024-06-22 ENCOUNTER — Ambulatory Visit (INDEPENDENT_AMBULATORY_CARE_PROVIDER_SITE_OTHER): Admitting: Rehabilitative and Restorative Service Providers"

## 2024-06-22 ENCOUNTER — Encounter: Payer: Self-pay | Admitting: Rehabilitative and Restorative Service Providers"

## 2024-06-22 DIAGNOSIS — M79641 Pain in right hand: Secondary | ICD-10-CM | POA: Diagnosis not present

## 2024-06-22 DIAGNOSIS — R278 Other lack of coordination: Secondary | ICD-10-CM | POA: Diagnosis not present

## 2024-06-22 DIAGNOSIS — M79642 Pain in left hand: Secondary | ICD-10-CM

## 2024-06-22 DIAGNOSIS — R202 Paresthesia of skin: Secondary | ICD-10-CM

## 2024-06-22 DIAGNOSIS — R600 Localized edema: Secondary | ICD-10-CM

## 2024-06-22 DIAGNOSIS — M6281 Muscle weakness (generalized): Secondary | ICD-10-CM

## 2024-06-23 NOTE — Therapy (Incomplete)
 OUTPATIENT OCCUPATIONAL THERAPY TREATMENT NOTE   Patient Name: Cynthia Short MRN: [id number] DOB:Jul 10, 1956, 68 y.o., female Today's Date: 06/23/2024  PCP: Hugh HAMS MD REFERRING PROVIDER: Joane Artist RAMAN, MD   END OF SESSION:    Past Medical History:  Diagnosis Date   Anemia    Anxiety    Arthritis    Asthma    B12 deficiency    Chest pain    Constipation    Diabetes mellitus    Dyspnea    Gallbladder problem    GERD (gastroesophageal reflux disease)    Hair loss    Heart murmur    Heart valve problem    Hiatal hernia    Hip problem    Hyperlipemia    Hypertension    Hypothyroidism    IBS (irritable bowel syndrome)    Joint pain    Knee problem    Leg edema    Osteoarthritis    Thyroid  disease    hypothyroidism   Tricuspid valve disorder    Vitamin D  deficiency    Past Surgical History:  Procedure Laterality Date   ABDOMINAL HYSTERECTOMY  3/98   CHOLECYSTECTOMY  02/2006   KNEE SURGERY     MYOMECTOMY  12/93   uterine fibroids   TMJ ARTHROPLASTY  8/82   Patient Active Problem List   Diagnosis Date Noted   Diverticulitis 03/24/2024   Statin intolerance 03/24/2024   Localized edema 11/05/2023   Microscopic hematuria 09/10/2023   Recurrent UTI 09/10/2023   Mixed hyperlipidemia 08/12/2023   Stress 07/14/2023   Other constipation 03/12/2023   Cough 02/10/2023   BMI 39.0-39.9,adult 02/10/2023   Obesity, Beginning BMI 39.86 02/10/2023   Other insomnia 09/09/2022   Essential hypertension 08/06/2022   Vitamin B 12 deficiency 08/06/2022   Diabetes mellitus (HCC) 07/09/2022   Vitamin D  deficiency 05/01/2022   B12 deficiency 05/01/2022   Asthma 10/17/2021   Heart murmur 10/17/2021   Hypothyroidism 10/17/2021   Hiatal hernia 10/17/2021   Dyspnea on exertion 07/15/2012   Class 2 severe obesity with serious comorbidity and body mass index (BMI) of 39.0 to 39.9 in adult [facility]) 07/15/2012   GERD (gastroesophageal reflux disease) 07/15/2012   Type 2  diabetes mellitus with obesity (HCC) 07/15/2012   Hypertension 07/15/2012    ONSET DATE: Approx 3-4 month onset of bil had pain   REFERRING DIAG: M79.641,M79.642 (ICD-10-CM) - Bilateral hand pain   THERAPY DIAG:  No diagnosis found.  Rationale for Evaluation and Treatment: Rehabilitation  PERTINENT HISTORY:  Per MD note bilat hand pain ongoing intermittently since March. Pain is varying, started in the L, resolved, move to the R, resolved, now in both hands. Pt locates pain to across the heads of the [facility] and IP joints of all fingers. She also notes trigger of the 3rd-4th fingers bilaterally and limited finger flexion.  She states 3-4 month onset of multiple issues including pain starting in left hand, moving over to the right hand with unknown activities.   Gets some numbness in the night, has locking in digits 4 and 5 bilaterally.  She also states a history of some other issues like diabetes and vitamin deficiencies and a family history of similar issues.  PRECAUTIONS: None  RED FLAGS: None   WEIGHT BEARING RESTRICTIONS: No  SUBJECTIVE:   SUBJECTIVE STATEMENT: She states ***.     PAIN:  Are you having pain? Yes: NPRS scale: *** 0/10 at rest, at worst in past week up to 8-9/10 opening jars, etc.  Pain location: Bilateral MCP joints especially digits 4 and 5 Pain description: Aching and sometimes sharp Aggravating factors: Tight gripping Relieving factors: Rest   PATIENT GOALS: To improve bilateral hand use and pain syndromes  NEXT MD VISIT: As needed    OBJECTIVE: (All objective assessments below are from initial evaluation on: 06/22/24 unless otherwise specified.)   HAND DOMINANCE: Right   ADLs: Overall ADLs: States decreased ability to grab, hold household objects, pain and difficulty to open containers, perform FMS tasks (manipulate fasteners on clothing), mild to moderate bathing problems as well.    FUNCTIONAL OUTCOME MEASURES: Eval: Patient Specific  Functional Scale: 3.75 (opening jars, drying hair, grasping objects, writing)  (Higher Score  =  Better Ability for the Selected Tasks)       UPPER EXTREMITY ROM     Shoulder to Wrist AROM Right eval Left eval  Shoulder flexion    Shoulder abduction    Shoulder extension    Shoulder internal rotation    Shoulder external rotation    Elbow flexion    Elbow extension    Forearm supination    Forearm pronation     Wrist flexion 66 60  Wrist extension 60 60  Wrist ulnar deviation    Wrist radial deviation    Functional dart thrower's motion (F-DTM) in ulnar flexion    F-DTM in radial extension     (Blank rows = not tested)  Evaluation: Right hand ring finger was considered the most stiff and most painful, so that was taken as a measure to be tracked throughout therapy sessions to look for improvement.  There was no time to measure all of her stiff fingers today during eval.  (This is also unnecessary.)  Hand AROM Right eval Rt 06/27/24  Full Fist Ability (or Gap to Distal Palmar Crease) Unable due to pain and stiffness and triggering   Thumb Opposition  (Kapandji Scale)     Thumb MCP (0-60)    Thumb IP (0-80)    Thumb Radial Abduction Span    Thumb Palmar Abduction Span    Index MCP (0-90)    Index PIP (0-100)    Index DIP (0-70)     Long MCP (0-90)     Long PIP (0-100)     Long DIP (0-70)     Ring MCP (0-90) 0- 57  0 - ***  Ring PIP (0-100) 0-  84 0 - ***  Ring DIP (0-70) 0-  27 0- ***  Little MCP (0-90)     Little PIP (0-100)     Little DIP (0-70)     (Blank rows = not tested)   HAND FUNCTION: Eval: Observed weakness in affected bil hand due to painful triggering of fingers.  Details will be tested in upcoming sessions as tolerated.  Grip strength Right: TBD lbs, Left: TBD lbs   COORDINATION: Eval: Observed coordination impairments with affected bilateral hands as seen by painful triggering of fingers and stiffness.  Details will be tested as tolerated in  upcoming sessions  SENSATION: Eval:  Light touch intact today, though some complaints of ulnar nerve paresthesia bilaterally and she had a positive elbow flexion test for cubital tunnel compression.  Approximately 2 seconds until affected  EDEMA:   Eval: Mildly swollen in bilateral MCP joints of both hands, slightly red, tender volarly especially at digits 4 and 5 bilaterally  OBSERVATIONS:   Eval: Seems like trigger finger in digits 4 and 5 bilaterally, worse in the right hand.  Some  mild underlying MCP joint arthritis.  Stiffness largely due to learned behaviors and disuse after triggering.  Also some ulnar nerve compression at the cubital tunnel bilaterally due to sleep postures   TODAY'S TREATMENT:  06/27/24: ***     Exercises - Wrist Flexion Stretch  - 4 x daily - 2-3 reps - 15 sec hold - Wrist Prayer Stretch  - 4 x daily - 3-5 reps - 15 sec hold - BACK KNUCKLE STRETCHES   - 4 x daily - 3-5 reps - 15 sec hold - HOOK Stretch  - 4 x daily - 3-5 reps - 15-20 sec hold - Ulnar Nerve Flossing  - 3-4 x daily - 1-2 sets - 5-10 reps    PATIENT EDUCATION: Education details: See tx section above for details  Person educated: Patient Education method: Verbal Instruction, Teach back, Handouts  Education comprehension: States and demonstrates understanding, Additional Education required    HOME EXERCISE PROGRAM: Access Code: [email or web address] URL: [email or web address] Date: 06/22/2024 Prepared by: Melvenia Ada   GOALS: Goals reviewed with patient? Yes   SHORT TERM GOALS: (STG required if POC>30 days) Target Date: 07/01/24  Pt will obtain protective, custom orthotic. Goal status: TBD/PRN  2.  Pt will demo/state understanding of initial HEP to improve pain levels and prerequisite motion. Goal status: INITIAL   LONG TERM GOALS: Target Date: 08/05/24  Pt will improve functional ability by decreased impairment per PSFS assessment from 3.75 to 6 or better, for better  quality of life. Goal status: INITIAL  2.  Pt will improve grip strength in bilateral hands to at least nonpainful 35 lbs for functional use at home and in IADLs. Goal status: INITIAL  3.  Pt will improve A/ROM in Rt RF TAM from 168* to at least 200*, to have functional motion for tasks like reach and grasp.  Goal status: INITIAL  4.  Pt will decrease pain at worst from 8-9/10 to 3/10 or better to have better sleep and occupational participation in daily roles. Goal status: INITIAL   ASSESSMENT:  CLINICAL IMPRESSION:  06/27/24: ***  PLAN:  OT FREQUENCY: 1-2x/week  OT DURATION: 6 weeks through 08/05/2024 and up to 10 total visits as needed   PLANNED INTERVENTIONS: 97535 self care/ADL training, 02889 therapeutic exercise, 97530 therapeutic activity, 97112 neuromuscular re-education, 97140 manual therapy, 97035 ultrasound, 97032 electrical stimulation (manual), 97760 Orthotic Initial, [id number] Orthotic/Prosthetic subsequent, compression bandaging, Dry needling, energy conservation, coping strategies training, and patient/family education  RECOMMENDED OTHER SERVICES: none now    CONSULTED AND AGREED WITH PLAN OF CARE: Patient  PLAN FOR NEXT SESSION:   ***   Melvenia Ada, OTR/L, CHT  06/23/2024, 5:08 PM

## 2024-06-27 ENCOUNTER — Encounter: Admitting: Rehabilitative and Restorative Service Providers"

## 2024-06-30 NOTE — Therapy (Incomplete)
 OUTPATIENT OCCUPATIONAL THERAPY TREATMENT NOTE   Patient Name: Cynthia Short MRN: [id number] DOB:1956/01/14, 68 y.o., female Today's Date: 06/30/2024  PCP: Hugh HAMS MD REFERRING PROVIDER: Joane Artist RAMAN, MD   END OF SESSION:    Past Medical History:  Diagnosis Date   Anemia    Anxiety    Arthritis    Asthma    B12 deficiency    Chest pain    Constipation    Diabetes mellitus    Dyspnea    Gallbladder problem    GERD (gastroesophageal reflux disease)    Hair loss    Heart murmur    Heart valve problem    Hiatal hernia    Hip problem    Hyperlipemia    Hypertension    Hypothyroidism    IBS (irritable bowel syndrome)    Joint pain    Knee problem    Leg edema    Osteoarthritis    Thyroid  disease    hypothyroidism   Tricuspid valve disorder    Vitamin D  deficiency    Past Surgical History:  Procedure Laterality Date   ABDOMINAL HYSTERECTOMY  3/98   CHOLECYSTECTOMY  02/2006   KNEE SURGERY     MYOMECTOMY  12/93   uterine fibroids   TMJ ARTHROPLASTY  8/82   Patient Active Problem List   Diagnosis Date Noted   Diverticulitis 03/24/2024   Statin intolerance 03/24/2024   Localized edema 11/05/2023   Microscopic hematuria 09/10/2023   Recurrent UTI 09/10/2023   Mixed hyperlipidemia 08/12/2023   Stress 07/14/2023   Other constipation 03/12/2023   Cough 02/10/2023   BMI 39.0-39.9,adult 02/10/2023   Obesity, Beginning BMI 39.86 02/10/2023   Other insomnia 09/09/2022   Essential hypertension 08/06/2022   Vitamin B 12 deficiency 08/06/2022   Diabetes mellitus (HCC) 07/09/2022   Vitamin D  deficiency 05/01/2022   B12 deficiency 05/01/2022   Asthma 10/17/2021   Heart murmur 10/17/2021   Hypothyroidism 10/17/2021   Hiatal hernia 10/17/2021   Dyspnea on exertion 07/15/2012   Class 2 severe obesity with serious comorbidity and body mass index (BMI) of 39.0 to 39.9 in adult [facility]) 07/15/2012   GERD (gastroesophageal reflux disease) 07/15/2012   Type 2  diabetes mellitus with obesity (HCC) 07/15/2012   Hypertension 07/15/2012    ONSET DATE: Approx 3-4 month onset of bil had pain   REFERRING DIAG: M79.641,M79.642 (ICD-10-CM) - Bilateral hand pain   THERAPY DIAG:  No diagnosis found.  Rationale for Evaluation and Treatment: Rehabilitation  PERTINENT HISTORY:  Per MD note bilat hand pain ongoing intermittently since March. Pain is varying, started in the L, resolved, move to the R, resolved, now in both hands. Pt locates pain to across the heads of the [facility] and IP joints of all fingers. She also notes trigger of the 3rd-4th fingers bilaterally and limited finger flexion.  She states 3-4 month onset of multiple issues including pain starting in left hand, moving over to the right hand with unknown activities.   Gets some numbness in the night, has locking in digits 4 and 5 bilaterally.  She also states a history of some other issues like diabetes and vitamin deficiencies and a family history of similar issues.  PRECAUTIONS: None  RED FLAGS: None   WEIGHT BEARING RESTRICTIONS: No  SUBJECTIVE:   SUBJECTIVE STATEMENT: She states ***.     PAIN:  Are you having pain? Yes: NPRS scale: *** 0/10 at rest, at worst in past week up to 8-9/10 opening jars, etc.  Pain location: Bilateral MCP joints especially digits 4 and 5 Pain description: Aching and sometimes sharp Aggravating factors: Tight gripping Relieving factors: Rest   PATIENT GOALS: To improve bilateral hand use and pain syndromes  NEXT MD VISIT: As needed    OBJECTIVE: (All objective assessments below are from initial evaluation on: 06/22/24 unless otherwise specified.)   HAND DOMINANCE: Right   ADLs: Overall ADLs: States decreased ability to grab, hold household objects, pain and difficulty to open containers, perform FMS tasks (manipulate fasteners on clothing), mild to moderate bathing problems as well.    FUNCTIONAL OUTCOME MEASURES: Eval: Patient Specific  Functional Scale: 3.75 (opening jars, drying hair, grasping objects, writing)  (Higher Score  =  Better Ability for the Selected Tasks)       UPPER EXTREMITY ROM     Shoulder to Wrist AROM Right eval Left eval  Shoulder flexion    Shoulder abduction    Shoulder extension    Shoulder internal rotation    Shoulder external rotation    Elbow flexion    Elbow extension    Forearm supination    Forearm pronation     Wrist flexion 66 60  Wrist extension 60 60  Wrist ulnar deviation    Wrist radial deviation    Functional dart thrower's motion (F-DTM) in ulnar flexion    F-DTM in radial extension     (Blank rows = not tested)  Evaluation: Right hand ring finger was considered the most stiff and most painful, so that was taken as a measure to be tracked throughout therapy sessions to look for improvement.  There was no time to measure all of her stiff fingers today during eval.  (This is also unnecessary.)  Hand AROM Right eval Rt 07/04/24  Full Fist Ability (or Gap to Distal Palmar Crease) Unable due to pain and stiffness and triggering   Thumb Opposition  (Kapandji Scale)     Thumb MCP (0-60)    Thumb IP (0-80)    Thumb Radial Abduction Span    Thumb Palmar Abduction Span    Index MCP (0-90)    Index PIP (0-100)    Index DIP (0-70)     Long MCP (0-90)     Long PIP (0-100)     Long DIP (0-70)     Ring MCP (0-90) 0- 57  0 - ***  Ring PIP (0-100) 0-  84 0 - ***  Ring DIP (0-70) 0-  27 0- ***  Little MCP (0-90)     Little PIP (0-100)     Little DIP (0-70)     (Blank rows = not tested)   HAND FUNCTION: Eval: Observed weakness in affected bil hand due to painful triggering of fingers.  Details will be tested in upcoming sessions as tolerated.  Grip strength Right: TBD lbs, Left: TBD lbs   COORDINATION: Eval: Observed coordination impairments with affected bilateral hands as seen by painful triggering of fingers and stiffness.  Details will be tested as tolerated in  upcoming sessions  SENSATION: Eval:  Light touch intact today, though some complaints of ulnar nerve paresthesia bilaterally and she had a positive elbow flexion test for cubital tunnel compression.  Approximately 2 seconds until affected  EDEMA:   Eval: Mildly swollen in bilateral MCP joints of both hands, slightly red, tender volarly especially at digits 4 and 5 bilaterally  OBSERVATIONS:   Eval: Seems like trigger finger in digits 4 and 5 bilaterally, worse in the right hand.  Some  mild underlying MCP joint arthritis.  Stiffness largely due to learned behaviors and disuse after triggering.  Also some ulnar nerve compression at the cubital tunnel bilaterally due to sleep postures   TODAY'S TREATMENT:  07/04/24: ***     Exercises - Wrist Flexion Stretch  - 4 x daily - 2-3 reps - 15 sec hold - Wrist Prayer Stretch  - 4 x daily - 3-5 reps - 15 sec hold - BACK KNUCKLE STRETCHES   - 4 x daily - 3-5 reps - 15 sec hold - HOOK Stretch  - 4 x daily - 3-5 reps - 15-20 sec hold - Ulnar Nerve Flossing  - 3-4 x daily - 1-2 sets - 5-10 reps    PATIENT EDUCATION: Education details: See tx section above for details  Person educated: Patient Education method: Verbal Instruction, Teach back, Handouts  Education comprehension: States and demonstrates understanding, Additional Education required    HOME EXERCISE PROGRAM: Access Code: [email or web address] URL: [email or web address] Date: 06/22/2024 Prepared by: Melvenia Ada   GOALS: Goals reviewed with patient? Yes   SHORT TERM GOALS: (STG required if POC>30 days) Target Date: 07/01/24  Pt will obtain protective, custom orthotic. Goal status: TBD/PRN  2.  Pt will demo/state understanding of initial HEP to improve pain levels and prerequisite motion. Goal status: INITIAL   LONG TERM GOALS: Target Date: 08/05/24  Pt will improve functional ability by decreased impairment per PSFS assessment from 3.75 to 6 or better, for better  quality of life. Goal status: INITIAL  2.  Pt will improve grip strength in bilateral hands to at least nonpainful 35 lbs for functional use at home and in IADLs. Goal status: INITIAL  3.  Pt will improve A/ROM in Rt RF TAM from 168* to at least 200*, to have functional motion for tasks like reach and grasp.  Goal status: INITIAL  4.  Pt will decrease pain at worst from 8-9/10 to 3/10 or better to have better sleep and occupational participation in daily roles. Goal status: INITIAL   ASSESSMENT:  CLINICAL IMPRESSION:  07/04/24: ***  PLAN:  OT FREQUENCY: 1-2x/week  OT DURATION: 6 weeks through 08/05/2024 and up to 10 total visits as needed   PLANNED INTERVENTIONS: 97535 self care/ADL training, 02889 therapeutic exercise, 97530 therapeutic activity, 97112 neuromuscular re-education, 97140 manual therapy, 97035 ultrasound, 97032 electrical stimulation (manual), 97760 Orthotic Initial, [id number] Orthotic/Prosthetic subsequent, compression bandaging, Dry needling, energy conservation, coping strategies training, and patient/family education  RECOMMENDED OTHER SERVICES: none now    CONSULTED AND AGREED WITH PLAN OF CARE: Patient  PLAN FOR NEXT SESSION:   ***   Melvenia Ada, OTR/L, CHT  06/30/2024, 10:19 AM

## 2024-07-04 ENCOUNTER — Encounter: Admitting: Rehabilitative and Restorative Service Providers"

## 2024-07-06 ENCOUNTER — Other Ambulatory Visit: Payer: Self-pay

## 2024-07-06 ENCOUNTER — Encounter: Payer: Self-pay | Admitting: Family Medicine

## 2024-07-06 ENCOUNTER — Ambulatory Visit (INDEPENDENT_AMBULATORY_CARE_PROVIDER_SITE_OTHER): Admitting: Family Medicine

## 2024-07-06 VITALS — BP 140/84 | HR 69 | Ht 63.0 in | Wt 227.0 lb

## 2024-07-06 DIAGNOSIS — M79642 Pain in left hand: Secondary | ICD-10-CM | POA: Diagnosis not present

## 2024-07-06 DIAGNOSIS — M79641 Pain in right hand: Secondary | ICD-10-CM

## 2024-07-06 NOTE — Patient Instructions (Signed)
 Thank you for coming in today.

## 2024-07-06 NOTE — Progress Notes (Signed)
   I, Leotis Batter, CMA acting as a scribe for Artist Lloyd, MD.  Cynthia Short is a 68 y.o. female who presents to [facility] today for 31-month f/u bilat hand pain. Pt was last seen by Dr. Lloyd on 06/07/24 and was advised to use Voltaren gel and referred to hand therapy, completing 1 visit.  Today, pt reports compliance with HEP with minimal change in sx. Not using Voltaren Gel prn. Minimal change in sx since last visit. Planning for OT once weekly.   Dx imaging: 06/07/24 [facility] & L hand XR  Pertinent review of systems: No fevers or chills  Relevant historical information: Hypertension and diabetes   Exam:  BP (!) 140/84   Pulse 69   Ht 5' 3 (1.6 m)   Wt 227 lb (103 kg)   SpO2 96%   BMI 40.21 kg/m  General: Well Developed, well nourished, and in no acute distress.   MSK: Hands bilaterally some swelling synovitis across MCPs.  Triggering present bilateral 4th and 5th MCPs.       Assessment and Plan: 68 y.o. female with bilateral hand pain primarily due to trigger finger 4th and 5th digits.  She has had her first visit with hand therapy which has been helpful.  She would like to avoid injections for now if possible.  Plan to continue hand therapy and return if not improved for potential injection.  Patient did have a good rheumatologic workup with her PCP office in April that was negative.  Results will be sent to scan.   PDMP not reviewed this encounter. No orders of the defined types were placed in this encounter.  No orders of the defined types were placed in this encounter.    Discussed warning signs or symptoms. Please see discharge instructions. Patient expresses understanding.   The above documentation has been reviewed and is accurate and complete Artist Lloyd, M.D.

## 2024-07-10 ENCOUNTER — Other Ambulatory Visit (INDEPENDENT_AMBULATORY_CARE_PROVIDER_SITE_OTHER): Payer: Self-pay | Admitting: Family Medicine

## 2024-07-10 DIAGNOSIS — Z794 Long term (current) use of insulin: Secondary | ICD-10-CM

## 2024-07-10 DIAGNOSIS — E559 Vitamin D deficiency, unspecified: Secondary | ICD-10-CM

## 2024-07-14 NOTE — Therapy (Signed)
 OUTPATIENT OCCUPATIONAL THERAPY TREATMENT NOTE   Patient Name: Cynthia Short MRN: [id number] DOB:[birth year], 68 y.o., female Today's Date: 07/15/2024  PCP: Hugh HAMS MD REFERRING PROVIDER: Joane Artist RAMAN, MD   END OF SESSION:  OT End of Session - 07/15/24 1015     Visit Number 2    Number of Visits 10    Date for OT Re-Evaluation 08/05/24    Authorization Type [employer] Medicare    OT Start Time 1015    OT Stop Time 1055    OT Time Calculation (min) 40 min    Activity Tolerance Patient tolerated treatment well;No increased pain;Patient limited by fatigue;Patient limited by pain    Behavior During Therapy [facility] for tasks assessed/performed           Past Medical History:  Diagnosis Date   Anemia    Anxiety    Arthritis    Asthma    B12 deficiency    Chest pain    Constipation    Diabetes mellitus    Dyspnea    Gallbladder problem    GERD (gastroesophageal reflux disease)    Hair loss    Heart murmur    Heart valve problem    Hiatal hernia    Hip problem    Hyperlipemia    Hypertension    Hypothyroidism    IBS (irritable bowel syndrome)    Joint pain    Knee problem    Leg edema    Osteoarthritis    Thyroid  disease    hypothyroidism   Tricuspid valve disorder    Vitamin D  deficiency    Past Surgical History:  Procedure Laterality Date   ABDOMINAL HYSTERECTOMY  3/98   CHOLECYSTECTOMY  02/2006   KNEE SURGERY     MYOMECTOMY  12/93   uterine fibroids   TMJ ARTHROPLASTY  8/82   Patient Active Problem List   Diagnosis Date Noted   Diverticulitis 03/24/2024   Statin intolerance 03/24/2024   Localized edema 11/05/2023   Microscopic hematuria 09/10/2023   Recurrent UTI 09/10/2023   Mixed hyperlipidemia 08/12/2023   Stress 07/14/2023   Other constipation 03/12/2023   Cough 02/10/2023   BMI 39.0-39.9,adult 02/10/2023   Obesity, Beginning BMI 39.86 02/10/2023   Other insomnia 09/09/2022   Essential hypertension 08/06/2022   Vitamin B 12 deficiency  08/06/2022   Diabetes mellitus (HCC) 07/09/2022   Vitamin D  deficiency 05/01/2022   B12 deficiency 05/01/2022   Asthma 10/17/2021   Heart murmur 10/17/2021   Hypothyroidism 10/17/2021   Hiatal hernia 10/17/2021   Dyspnea on exertion 07/15/2012   Class 2 severe obesity with serious comorbidity and body mass index (BMI) of 39.0 to 39.9 in adult [facility]) 07/15/2012   GERD (gastroesophageal reflux disease) 07/15/2012   Type 2 diabetes mellitus with obesity (HCC) 07/15/2012   Hypertension 07/15/2012    ONSET DATE: Approx 3-4 month onset of bil had pain   REFERRING DIAG: M79.641,M79.642 (ICD-10-CM) - Bilateral hand pain   THERAPY DIAG:  Localized edema  Muscle weakness (generalized)  Other lack of coordination  Pain in right hand  Pain in left hand  Paresthesia of skin  Rationale for Evaluation and Treatment: Rehabilitation  PERTINENT HISTORY:  Per MD note bilat hand pain ongoing intermittently since March. Pain is varying, started in the L, resolved, move to the R, resolved, now in both hands. Pt locates pain to across the heads of the [facility] and IP joints of all fingers. She also notes trigger of the 3rd-4th fingers bilaterally and  limited finger flexion.  She states 3-4 month onset of multiple issues including pain starting in left hand, moving over to the right hand with unknown activities.   Gets some numbness in the night, has locking in digits 4 and 5 bilaterally.  She also states a history of some other issues like diabetes and vitamin deficiencies and a family history of similar issues.  PRECAUTIONS: None  RED FLAGS: None   WEIGHT BEARING RESTRICTIONS: No  SUBJECTIVE:   SUBJECTIVE STATEMENT: She hasn't been seen in therapy for weeks. Today she states having less pain now than before and being more aware of body mechanics and triggering.  She states exercising every day and she is not wearing Band-Aids now but is wearing a pair of gloves at home to prevent triggerin, per  self reports.     PAIN:  Are you having pain? Yes: NPRS scale: 0-1/10 at rest, at worst in past week up to 5-6/10 opening jars, etc.  Pain location: Bilateral MCP joints especially digits 4 and 5 Pain description: Aching and sometimes sharp Aggravating factors: Tight gripping Relieving factors: Rest   PATIENT GOALS: To improve bilateral hand use and pain syndromes  NEXT MD VISIT: As needed    OBJECTIVE: (All objective assessments below are from initial evaluation on: 06/22/24 unless otherwise specified.)   HAND DOMINANCE: Right   ADLs: Overall ADLs: States decreased ability to grab, hold household objects, pain and difficulty to open containers, perform FMS tasks (manipulate fasteners on clothing), mild to moderate bathing problems as well.    FUNCTIONAL OUTCOME MEASURES: Eval: Patient Specific Functional Scale: 3.75 (opening jars, drying hair, grasping objects, writing)  (Higher Score  =  Better Ability for the Selected Tasks)       UPPER EXTREMITY ROM     Shoulder to Wrist AROM Right eval Left eval Rt  / Lt  07/15/24  Shoulder flexion     Shoulder abduction     Shoulder extension     Shoulder internal rotation     Shoulder external rotation     Elbow flexion     Elbow extension     Forearm supination     Forearm pronation      Wrist flexion 66 60 72  /  60  Wrist extension 60 60 55  / 51  Wrist ulnar deviation     Wrist radial deviation     Functional dart thrower's motion (F-DTM) in ulnar flexion     F-DTM in radial extension      (Blank rows = not tested)  Evaluation: Right hand ring finger was considered the most stiff and most painful, so that was taken as a measure to be tracked throughout therapy sessions to look for improvement.  There was no time to measure all of her stiff fingers today during eval.  (This is also unnecessary.)  Hand AROM Right eval Rt 07/15/24  Full Fist Ability (or Gap to Distal Palmar Crease) Unable due to pain and stiffness and  triggering   Thumb Opposition  (Kapandji Scale)     Thumb MCP (0-60)    Thumb IP (0-80)    Thumb Radial Abduction Span    Thumb Palmar Abduction Span    Index MCP (0-90)    Index PIP (0-100)    Index DIP (0-70)     Long MCP (0-90)     Long PIP (0-100)     Long DIP (0-70)     Ring MCP (0-90) 0- 57  0 -  54  Ring PIP (0-100) 0-  84 0 - 89  Ring DIP (0-70) 0-  27 0- 33  Little MCP (0-90)     Little PIP (0-100)     Little DIP (0-70)     (Blank rows = not tested)   HAND FUNCTION: 07/15/24 Grip strength Right: 14 lbs tender , Left: 5 lbs pain    COORDINATION: Eval: Observed coordination impairments with affected bilateral hands as seen by painful triggering of fingers and stiffness.  Details will be tested as tolerated in upcoming sessions  SENSATION: Eval:  Light touch intact today, though some complaints of ulnar nerve paresthesia bilaterally and she had a positive elbow flexion test for cubital tunnel compression.  Approximately 2 seconds until affected  EDEMA:   Eval: Mildly swollen in bilateral MCP joints of both hands, slightly red, tender volarly especially at digits 4 and 5 bilaterally  OBSERVATIONS:   Eval: Seems like trigger finger in digits 4 and 5 bilaterally, worse in the right hand.  Some mild underlying MCP joint arthritis.  Stiffness largely due to learned behaviors and disuse after triggering.  Also some ulnar nerve compression at the cubital tunnel bilaterally due to sleep postures   TODAY'S TREATMENT:  07/15/24: Active range of motion for exercise also yields new measures which shows improving finger motion with less triggering.  Unfortunately wrist extension is still significantly tight which could be tight through the flexors-she was educated to do more prayer stretching longer, held longer, more often, etc.  We review her whole exercise program and she states little to no paresthesia but when she feels that is more in digits 1 through 3, so now we reviewed median  nerve flossing and management of nerve paresthesias.  We spent a good deal of time talking about self-care, body awareness, pacing of activities, adapting and modifying to prevent triggering and be more aware of fatigue and tightness in her body.  Her husband is also present and discussing these things together with her.   As she is doing well we decided to skip a week to allow self-management and she will return in 2 weeks for status check.    Exercises - Wrist Flexion Stretch  - 4 x daily - 2-3 reps - 15 sec hold - Wrist Prayer Stretch  - 4 x daily - 3-5 reps - 15 sec hold - BACK KNUCKLE STRETCHES   - 4 x daily - 3-5 reps - 15 sec hold - HOOK Stretch  - 4 x daily - 3-5 reps - 15-20 sec hold - Ulnar Nerve Flossing  - 3-4 x daily - 1-2 sets - 5-10 reps - Median Nerve Flossing  - 2-3 x daily - 5-10 reps    PATIENT EDUCATION: Education details: See tx section above for details  Person educated: Patient Education method: Verbal Instruction, Teach back, Handouts  Education comprehension: States and demonstrates understanding, Additional Education required    HOME EXERCISE PROGRAM: Access Code: [email or web address] URL: [email or web address] Date: 06/22/2024 Prepared by: Melvenia Ada   GOALS: Goals reviewed with patient? Yes   SHORT TERM GOALS: (STG required if POC>30 days) Target Date: 07/01/24  Pt will obtain protective, custom orthotic. Goal status: TBD/PRN  2.  Pt will demo/state understanding of initial HEP to improve pain levels and prerequisite motion. Goal status: 07/15/2024: Met   LONG TERM GOALS: Target Date: 08/05/24  Pt will improve functional ability by decreased impairment per PSFS assessment from 3.75 to 6 or better, for better quality of life. Goal status:  INITIAL  2.  Pt will improve grip strength in bilateral hands to at least nonpainful 35 lbs for functional use at home and in IADLs. Goal status: INITIAL  3.  Pt will improve A/ROM in Rt RF TAM  from 168* to at least 200*, to have functional motion for tasks like reach and grasp.  Goal status: INITIAL  4.  Pt will decrease pain at worst from 8-9/10 to 3/10 or better to have better sleep and occupational participation in daily roles. Goal status: INITIAL   ASSESSMENT:  CLINICAL IMPRESSION:  07/15/24: She is improving and managing symptoms conservatively, though needs to watch wrist stiffness.  We will follow-up in 2 weeks  PLAN:  OT FREQUENCY: 1-2x/week  OT DURATION: 6 weeks through 08/05/2024 and up to 10 total visits as needed   PLANNED INTERVENTIONS: 97535 self care/ADL training, 02889 therapeutic exercise, 97530 therapeutic activity, 97112 neuromuscular re-education, 97140 manual therapy, 97035 ultrasound, 97032 electrical stimulation (manual), 97760 Orthotic Initial, 97763 Orthotic/Prosthetic subsequent, compression bandaging, Dry needling, energy conservation, coping strategies training, and patient/family education  RECOMMENDED OTHER SERVICES: none now    CONSULTED AND AGREED WITH PLAN OF CARE: Patient  PLAN FOR NEXT SESSION:   Follow-up in 2 weeks to see if she is happy with conservative management, if she needs other ideas, if she wants a referral to the hand surgeon, etc.   Melvenia Ada, OTR/L, CHT  07/15/2024, 12:18 PM

## 2024-07-15 ENCOUNTER — Encounter: Payer: Self-pay | Admitting: Rehabilitative and Restorative Service Providers"

## 2024-07-15 ENCOUNTER — Ambulatory Visit (INDEPENDENT_AMBULATORY_CARE_PROVIDER_SITE_OTHER): Admitting: Rehabilitative and Restorative Service Providers"

## 2024-07-15 DIAGNOSIS — M79641 Pain in right hand: Secondary | ICD-10-CM | POA: Diagnosis not present

## 2024-07-15 DIAGNOSIS — R202 Paresthesia of skin: Secondary | ICD-10-CM

## 2024-07-15 DIAGNOSIS — M79642 Pain in left hand: Secondary | ICD-10-CM | POA: Diagnosis not present

## 2024-07-15 DIAGNOSIS — R278 Other lack of coordination: Secondary | ICD-10-CM | POA: Diagnosis not present

## 2024-07-15 DIAGNOSIS — M6281 Muscle weakness (generalized): Secondary | ICD-10-CM

## 2024-07-15 DIAGNOSIS — R600 Localized edema: Secondary | ICD-10-CM

## 2024-07-20 ENCOUNTER — Encounter: Admitting: Rehabilitative and Restorative Service Providers"

## 2024-07-21 NOTE — Therapy (Signed)
 OUTPATIENT OCCUPATIONAL THERAPY TREATMENT NOTE   Patient Name: Cynthia Short MRN: [id number] DOB:14-Mar-1956, 68 y.o., female Today's Date: 07/25/2024  PCP: Hugh HAMS MD REFERRING PROVIDER: Joane Artist RAMAN, MD   END OF SESSION:  OT End of Session - 07/25/24 1549     Visit Number 3    Number of Visits 10    Date for OT Re-Evaluation 08/05/24    Authorization Type [employer]    OT Start Time 1549    OT Stop Time 1639    OT Time Calculation (min) 50 min    Activity Tolerance Patient tolerated treatment well;No increased pain;Patient limited by fatigue;Patient limited by pain    Behavior During Therapy [facility] for tasks assessed/performed            Past Medical History:  Diagnosis Date   Anemia    Anxiety    Arthritis    Asthma    B12 deficiency    Chest pain    Constipation    Diabetes mellitus    Dyspnea    Gallbladder problem    GERD (gastroesophageal reflux disease)    Hair loss    Heart murmur    Heart valve problem    Hiatal hernia    Hip problem    Hyperlipemia    Hypertension    Hypothyroidism    IBS (irritable bowel syndrome)    Joint pain    Knee problem    Leg edema    Osteoarthritis    Thyroid  disease    hypothyroidism   Tricuspid valve disorder    Vitamin D  deficiency    Past Surgical History:  Procedure Laterality Date   ABDOMINAL HYSTERECTOMY  3/98   CHOLECYSTECTOMY  02/2006   KNEE SURGERY     MYOMECTOMY  12/93   uterine fibroids   TMJ ARTHROPLASTY  8/82   Patient Active Problem List   Diagnosis Date Noted   Diverticulitis 03/24/2024   Statin intolerance 03/24/2024   Localized edema 11/05/2023   Microscopic hematuria 09/10/2023   Recurrent UTI 09/10/2023   Mixed hyperlipidemia 08/12/2023   Stress 07/14/2023   Other constipation 03/12/2023   Cough 02/10/2023   BMI 39.0-39.9,adult 02/10/2023   Obesity, Beginning BMI 39.86 02/10/2023   Other insomnia 09/09/2022   Essential hypertension 08/06/2022   Vitamin B 12 deficiency  08/06/2022   Diabetes mellitus (HCC) 07/09/2022   Vitamin D  deficiency 05/01/2022   B12 deficiency 05/01/2022   Asthma 10/17/2021   Heart murmur 10/17/2021   Hypothyroidism 10/17/2021   Hiatal hernia 10/17/2021   Dyspnea on exertion 07/15/2012   Class 2 severe obesity with serious comorbidity and body mass index (BMI) of 39.0 to 39.9 in adult [facility]) 07/15/2012   GERD (gastroesophageal reflux disease) 07/15/2012   Type 2 diabetes mellitus with obesity (HCC) 07/15/2012   Hypertension 07/15/2012    ONSET DATE: Approx 3-4 month onset of bil had pain   REFERRING DIAG: M79.641,M79.642 (ICD-10-CM) - Bilateral hand pain   THERAPY DIAG:  Localized edema  Other lack of coordination  Muscle weakness (generalized)  Pain in right hand  Pain in left hand  Paresthesia of skin  Rationale for Evaluation and Treatment: Rehabilitation  PERTINENT HISTORY:  Per MD note bilat hand pain ongoing intermittently since March. Pain is varying, started in the L, resolved, move to the R, resolved, now in both hands. Pt locates pain to across the heads of the [facility] and IP joints of all fingers. She also notes trigger of the 3rd-4th fingers bilaterally  and limited finger flexion.  She states 3-4 month onset of multiple issues including pain starting in left hand, moving over to the right hand with unknown activities.   Gets some numbness in the night, has locking in digits 4 and 5 bilaterally.  She also states a history of some other issues like diabetes and vitamin deficiencies and a family history of similar issues.  PRECAUTIONS: None  RED FLAGS: None   WEIGHT BEARING RESTRICTIONS: No  SUBJECTIVE:   SUBJECTIVE STATEMENT: She states her hands are feeling a bit better overall, more concerned about LE issues now.     PAIN:  Are you having pain? Yes: NPRS scale: 3-4/10 at rest, at worst in past week up to 3-4/10 opening jars, etc.  Pain location: Bilateral MCP joints especially digits 4 and 5 Pain  description: Aching and sometimes sharp Aggravating factors: Tight gripping Relieving factors: Rest   PATIENT GOALS: To improve bilateral hand use and pain syndromes  NEXT MD VISIT: As needed    OBJECTIVE: (All objective assessments below are from initial evaluation on: 06/22/24 unless otherwise specified.)   HAND DOMINANCE: Right   ADLs: Overall ADLs: States decreased ability to grab, hold household objects, pain and difficulty to open containers, perform FMS tasks (manipulate fasteners on clothing), mild to moderate bathing problems as well.    FUNCTIONAL OUTCOME MEASURES: 07/25/24: PSFS 4.6    Eval: Patient Specific Functional Scale: 3.75 (opening jars, drying hair, grasping objects, writing)  (Higher Score  =  Better Ability for the Selected Tasks)       UPPER EXTREMITY ROM     Shoulder to Wrist AROM Right eval Left eval Rt  / Lt  07/15/24 Rt  / Lt  07/25/24  Shoulder flexion      Shoulder abduction      Shoulder extension      Shoulder internal rotation      Shoulder external rotation      Elbow flexion      Elbow extension      Forearm supination      Forearm pronation       Wrist flexion 66 60 72  /  60 75  /  66  Wrist extension 60 60 55  / 51 59  /  56  Wrist ulnar deviation      Wrist radial deviation      Functional dart thrower's motion (F-DTM) in ulnar flexion      F-DTM in radial extension       (Blank rows = not tested)  Evaluation: Right hand ring finger was considered the most stiff and most painful, so that was taken as a measure to be tracked throughout therapy sessions to look for improvement.  There was no time to measure all of her stiff fingers today during eval.  (This is also unnecessary.)  Hand AROM Right eval Rt 07/15/24  Full Fist Ability (or Gap to Distal Palmar Crease) Unable due to pain and stiffness and triggering   Thumb Opposition  (Kapandji Scale)     Thumb MCP (0-60)    Thumb IP (0-80)    Thumb Radial Abduction Span    Thumb  Palmar Abduction Span    Index MCP (0-90)    Index PIP (0-100)    Index DIP (0-70)     Long MCP (0-90)     Long PIP (0-100)     Long DIP (0-70)     Ring MCP (0-90) 0- 57  0 - 54  Ring PIP (0-100) 0-  84 0 - 89  Ring DIP (0-70) 0-  27 0- 33  Little MCP (0-90)     Little PIP (0-100)     Little DIP (0-70)     (Blank rows = not tested)   HAND FUNCTION: 07/25/24: Grip Rt: 14#, Lt: 11#    07/15/24 Grip strength Right: 14 lbs tender , Left: 5 lbs pain   COORDINATION: Eval: Observed coordination impairments with affected bilateral hands as seen by painful triggering of fingers and stiffness.  Details will be tested as tolerated in upcoming sessions  SENSATION: Eval:  Light touch intact today, though some complaints of ulnar nerve paresthesia bilaterally and she had a positive elbow flexion test for cubital tunnel compression.  Approximately 2 seconds until affected  EDEMA:   Eval: Mildly swollen in bilateral MCP joints of both hands, slightly red, tender volarly especially at digits 4 and 5 bilaterally  OBSERVATIONS:   Eval: Seems like trigger finger in digits 4 and 5 bilaterally, worse in the right hand.  Some mild underlying MCP joint arthritis.  Stiffness largely due to learned behaviors and disuse after triggering.  Also some ulnar nerve compression at the cubital tunnel bilaterally due to sleep postures   TODAY'S TREATMENT:  07/25/24: She performs active range of motion for exercise as well as new measures which shows some improvements in wrist motion and grip strength in the left hand is also somewhat improved today.  Additionally we discussed her home and functional activities and self-care/management of triggering fingers and nerve compression.  She does rate her ability very mildly improved now compared to the start of care.  OT reminds her that this is what conservative treatment looks like, and there are not many other options for her.  She should be wearing supportive compression  gloves with painful or tough activities, she should be doing nonpainful stretches, using heat and ice as helpful, doing self massages to triggering areas, wearing Band-Aids to prevent triggering, and other such techniques.   As she is having some knee pain and problems with gait instability, she was recommended to see a physical therapy for prehabilitation before any surgeries, to help her manage and prepare her body for surgery-she is considering knee surgery.  We also discussed safety and consideration of possible hand surgery and locking with knee issues and perhaps having to support herself on a walker after surgeries.  She and her husband think the therapist for the recommendations and they leave stating no increase in pain, understanding the program and that they will return in 3 weeks to assess current performance and the need for any more recommendations or therapy.     Exercises performed/reviewed today: - Wrist Flexion Stretch  - 4 x daily - 2-3 reps - 15 sec hold - Wrist Prayer Stretch  - 4 x daily - 3-5 reps - 15 sec hold - BACK KNUCKLE STRETCHES   - 4 x daily - 3-5 reps - 15 sec hold - HOOK Stretch  - 4 x daily - 3-5 reps - 15-20 sec hold - Median Nerve Flossing  - 2-3 x daily - 5-10 reps    PATIENT EDUCATION: Education details: See tx section above for details  Person educated: Patient Education method: Verbal Instruction, Teach back, Handouts  Education comprehension: States and demonstrates understanding, Additional Education required    HOME EXERCISE PROGRAM: Access Code: [email or web address] URL: [email or web address] Date: 06/22/2024 Prepared by: Melvenia Ada   GOALS: Goals reviewed with patient? Yes  SHORT TERM GOALS: (STG required if POC>30 days) Target Date: 07/01/24  Pt will obtain protective, custom orthotic. Goal status: TBD/PRN  2.  Pt will demo/state understanding of initial HEP to improve pain levels and prerequisite motion. Goal status:  07/15/2024: Met   LONG TERM GOALS: Target Date: 08/05/24  Pt will improve functional ability by decreased impairment per PSFS assessment from 3.75 to 6 or better, for better quality of life. Goal status: INITIAL  2.  Pt will improve grip strength in bilateral hands to at least nonpainful 35 lbs for functional use at home and in IADLs. Goal status: INITIAL  3.  Pt will improve A/ROM in Rt RF TAM from 168* to at least 200*, to have functional motion for tasks like reach and grasp.  Goal status: INITIAL  4.  Pt will decrease pain at worst from 8-9/10 to 3/10 or better to have better sleep and occupational participation in daily roles. Goal status: INITIAL   ASSESSMENT:  CLINICAL IMPRESSION:  07/25/24: She is managing a bit better, though having problems relaxing while performing stretches.  She also has some other issues like knee pain and meniscal tear that is making her hand recovery more complicated and is a major distractor.  We have to manage the best we can in life, and she will try to do that on her own over the next 3 weeks.    PLAN:  OT FREQUENCY: 1-2x/week  OT DURATION: 6 weeks through 08/05/2024 and up to 10 total visits as needed   PLANNED INTERVENTIONS: 97535 self care/ADL training, 02889 therapeutic exercise, 97530 therapeutic activity, 97112 neuromuscular re-education, 97140 manual therapy, 97035 ultrasound, 97032 electrical stimulation (manual), 97760 Orthotic Initial, 97763 Orthotic/Prosthetic subsequent, compression bandaging, Dry needling, energy conservation, coping strategies training, and patient/family education  CONSULTED AND AGREED WITH PLAN OF CARE: Patient  PLAN FOR NEXT SESSION:   Follow-up after 2 to 3 weeks of self-management to see if she needs anything else from therapy, perform a progress note and possible discharge at that time if there are no more therapeutic needs  Melvenia Ada, OTR/L, CHT  07/25/2024, 4:59 PM

## 2024-07-25 ENCOUNTER — Ambulatory Visit: Admitting: Rehabilitative and Restorative Service Providers"

## 2024-07-25 ENCOUNTER — Encounter: Payer: Self-pay | Admitting: Rehabilitative and Restorative Service Providers"

## 2024-07-25 ENCOUNTER — Encounter (INDEPENDENT_AMBULATORY_CARE_PROVIDER_SITE_OTHER): Payer: Self-pay

## 2024-07-25 DIAGNOSIS — N809 Endometriosis, unspecified: Secondary | ICD-10-CM | POA: Insufficient documentation

## 2024-07-25 DIAGNOSIS — K760 Fatty (change of) liver, not elsewhere classified: Secondary | ICD-10-CM | POA: Insufficient documentation

## 2024-07-25 DIAGNOSIS — H3530 Unspecified macular degeneration: Secondary | ICD-10-CM | POA: Insufficient documentation

## 2024-07-25 DIAGNOSIS — I341 Nonrheumatic mitral (valve) prolapse: Secondary | ICD-10-CM | POA: Insufficient documentation

## 2024-07-25 DIAGNOSIS — J309 Allergic rhinitis, unspecified: Secondary | ICD-10-CM | POA: Insufficient documentation

## 2024-07-25 DIAGNOSIS — M79642 Pain in left hand: Secondary | ICD-10-CM

## 2024-07-25 DIAGNOSIS — E538 Deficiency of other specified B group vitamins: Secondary | ICD-10-CM | POA: Insufficient documentation

## 2024-07-25 DIAGNOSIS — M79641 Pain in right hand: Secondary | ICD-10-CM | POA: Diagnosis not present

## 2024-07-25 DIAGNOSIS — K219 Gastro-esophageal reflux disease without esophagitis: Secondary | ICD-10-CM | POA: Insufficient documentation

## 2024-07-25 DIAGNOSIS — R202 Paresthesia of skin: Secondary | ICD-10-CM | POA: Diagnosis not present

## 2024-07-25 DIAGNOSIS — R278 Other lack of coordination: Secondary | ICD-10-CM | POA: Diagnosis not present

## 2024-07-25 DIAGNOSIS — E11319 Type 2 diabetes mellitus with unspecified diabetic retinopathy without macular edema: Secondary | ICD-10-CM | POA: Insufficient documentation

## 2024-07-25 DIAGNOSIS — E7800 Pure hypercholesterolemia, unspecified: Secondary | ICD-10-CM | POA: Insufficient documentation

## 2024-07-25 DIAGNOSIS — E1165 Type 2 diabetes mellitus with hyperglycemia: Secondary | ICD-10-CM | POA: Insufficient documentation

## 2024-07-25 DIAGNOSIS — I779 Disorder of arteries and arterioles, unspecified: Secondary | ICD-10-CM | POA: Insufficient documentation

## 2024-07-25 DIAGNOSIS — R809 Proteinuria, unspecified: Secondary | ICD-10-CM | POA: Insufficient documentation

## 2024-07-25 DIAGNOSIS — R600 Localized edema: Secondary | ICD-10-CM | POA: Diagnosis not present

## 2024-07-25 DIAGNOSIS — M6281 Muscle weakness (generalized): Secondary | ICD-10-CM | POA: Diagnosis not present

## 2024-07-25 DIAGNOSIS — G720 Drug-induced myopathy: Secondary | ICD-10-CM | POA: Insufficient documentation

## 2024-07-27 ENCOUNTER — Ambulatory Visit (INDEPENDENT_AMBULATORY_CARE_PROVIDER_SITE_OTHER): Admitting: Family Medicine

## 2024-07-27 ENCOUNTER — Encounter (INDEPENDENT_AMBULATORY_CARE_PROVIDER_SITE_OTHER): Payer: Self-pay | Admitting: Family Medicine

## 2024-07-27 VITALS — BP 129/75 | HR 66 | Temp 98.2°F | Ht 63.0 in | Wt 224.0 lb

## 2024-07-27 DIAGNOSIS — E559 Vitamin D deficiency, unspecified: Secondary | ICD-10-CM

## 2024-07-27 DIAGNOSIS — E119 Type 2 diabetes mellitus without complications: Secondary | ICD-10-CM | POA: Diagnosis not present

## 2024-07-27 DIAGNOSIS — E1169 Type 2 diabetes mellitus with other specified complication: Secondary | ICD-10-CM

## 2024-07-27 DIAGNOSIS — Z6839 Body mass index (BMI) 39.0-39.9, adult: Secondary | ICD-10-CM

## 2024-07-27 DIAGNOSIS — Z7984 Long term (current) use of oral hypoglycemic drugs: Secondary | ICD-10-CM | POA: Diagnosis not present

## 2024-07-27 DIAGNOSIS — I10 Essential (primary) hypertension: Secondary | ICD-10-CM | POA: Diagnosis not present

## 2024-07-27 DIAGNOSIS — F3289 Other specified depressive episodes: Secondary | ICD-10-CM

## 2024-07-27 DIAGNOSIS — Z6841 Body Mass Index (BMI) 40.0 and over, adult: Secondary | ICD-10-CM

## 2024-07-27 DIAGNOSIS — F5089 Other specified eating disorder: Secondary | ICD-10-CM | POA: Diagnosis not present

## 2024-07-27 DIAGNOSIS — E669 Obesity, unspecified: Secondary | ICD-10-CM

## 2024-07-27 MED ORDER — BUPROPION HCL ER (SR) 150 MG PO TB12: 150.0000 mg | ORAL_TABLET | Freq: Every day | ORAL | 0 refills | Status: DC

## 2024-07-27 MED ORDER — VITAMIN D (ERGOCALCIFEROL) 1.25 MG (50000 UNIT) PO CAPS: 50000.0000 [IU] | ORAL_CAPSULE | ORAL | 0 refills | Status: DC

## 2024-07-27 MED ORDER — METFORMIN HCL ER 500 MG PO TB24: 500.0000 mg | ORAL_TABLET | Freq: Two times a day (BID) | ORAL | 0 refills | Status: DC

## 2024-07-27 NOTE — Progress Notes (Signed)
 Office: [phone]  /  Fax: [phone]  WEIGHT SUMMARY AND BIOMETRICS  Anthropometric Measurements Height: 5' 3 (1.6 m) Weight: 224 lb (101.6 kg) BMI (Calculated): 39.69 Weight at Last Visit: 227 lb Weight Lost Since Last Visit: 3 lb Weight Gained Since Last Visit: 0 Starting Weight: 225 lb Total Weight Loss (lbs): 1 lb (0.454 kg) Peak Weight: 262 lb   Body Composition  Body Fat %: 49.2 % Fat Mass (lbs): 110.4 lbs Muscle Mass (lbs): 108 lbs Total Body Water (lbs): 82.2 lbs Visceral Fat Rating : 16   Other Clinical Data Fasting: no Labs: no Today's Visit #: 23 Starting Date: 11/26/21    Chief Complaint: OBESITY   Discussed the use of AI scribe software for clinical note transcription with the patient, who gave verbal consent to proceed.  History of Present Illness Cynthia Short is a 68 year old female with obesity and type 2 diabetes who presents for obesity treatment and diabetes management.  She is focused on addressing her obesity and has been actively working on diet, exercise, and weight loss to improve her glucose levels. She rarely experiences hunger as a physical sensation, yet this does not deter her from eating. She previously attempted a low-carb diet, resulting in an 8-pound weight loss over three weeks, but found it unsustainable and regained some weight. Her weight tends to fluctuate, with a pattern of losing and regaining three pounds, and she struggles to overcome this plateau. She has tried Ozempic  and Rybelsus , both of which caused intolerable side effects, including stomach issues and headaches, respectively.  Regarding her type 2 diabetes management, she is currently on metformin  and requests a refill. Her most recent hemoglobin A1c was 7.2% as of June 01, 2024, an increase from 6.5% last year. She monitors her blood sugar at home, noting morning readings of 120-130 mg/dL recently, with previous readings of 104-112 mg/dL. Her blood sugar can  reach 180 mg/dL after meals, which she associates with changes in her vision and headaches. She has noticed that eating peanuts at night helps lower her morning blood sugar levels.  She has a history of vitamin D  deficiency and is on prescription vitamin D  50,000 IU weekly, requesting a refill today. Her vitamin D  level was 35.9 ng/mL in June 2025, which is an improvement from prior values.  Her family history includes a father who was a severe diabetic with kidney failure and temporary blindness. This history contributes to her concerns about potential side effects of diabetes medications, particularly regarding kidney and vision health.  Socially, she and her husband are retired and often eat out, which they acknowledge impacts their dietary habits. They try to balance larger meals with lighter ones but find it challenging to maintain a consistent routine. They enjoy dining out as a form of entertainment and social activity.      PHYSICAL EXAM:  Blood pressure 129/75, pulse 66, temperature 98.2 F (36.8 C), height 5' 3 (1.6 m), weight 224 lb (101.6 kg), SpO2 94%. Body mass index is 39.68 kg/m.  DIAGNOSTIC DATA REVIEWED:  BMET    Component Value Date/Time   NA 140 06/01/2024 1138   K 4.3 06/01/2024 1138   CL 100 06/01/2024 1138   CO2 23 06/01/2024 1138   GLUCOSE 107 (H) 06/01/2024 1138   BUN 14 06/01/2024 1138   CREATININE 0.67 06/01/2024 1138   CALCIUM 9.4 06/01/2024 1138   GFRNONAA 87 12/04/2020 0000   GFRAA 105 12/04/2020 0000   Lab Results  Component Value  Date   HGBA1C 7.2 (H) 06/01/2024   HGBA1C 6.3 (H) 08/26/2018   Lab Results  Component Value Date   INSULIN  12.2 08/12/2023   INSULIN  14.9 08/26/2018   Lab Results  Component Value Date   TSH 2.160 08/12/2023   CBC    Component Value Date/Time   WBC 7.7 11/26/2021 1316   RBC 4.42 11/26/2021 1316   HGB 12.2 11/26/2021 1316   HCT 38.1 11/26/2021 1316   PLT 300 11/26/2021 1316   MCV 86 11/26/2021 1316   MCH  27.6 11/26/2021 1316   MCHC 32.0 11/26/2021 1316   RDW 13.8 11/26/2021 1316   Iron Studies No results found for: IRON, TIBC, FERRITIN, IRONPCTSAT Lipid Panel     Component Value Date/Time   CHOL 150 08/12/2023 1121   TRIG 88 08/12/2023 1121   HDL 47 08/12/2023 1121   CHOLHDL 3.9 11/26/2021 1316   LDLCALC 86 08/12/2023 1121   Hepatic Function Panel     Component Value Date/Time   PROT 7.0 06/01/2024 1138   ALBUMIN 4.2 06/01/2024 1138   AST 16 06/01/2024 1138   ALT 17 06/01/2024 1138   ALKPHOS 90 06/01/2024 1138   BILITOT 0.3 06/01/2024 1138      Component Value Date/Time   TSH 2.160 08/12/2023 1121   Nutritional Lab Results  Component Value Date   VD25OH 35.9 06/01/2024   VD25OH 22.6 (L) 08/12/2023   VD25OH 25.5 (L) 05/01/2022     Assessment and Plan Assessment & Plan Obesity and emotional eating behaviors Reports difficulty with hunger cues and emotional eating. Previous trials of Ozempic  and Rybelsus  were not tolerated due to side effects. Low carb diet resulted in initial weight loss but was not sustainable. Discussion of hunger mechanisms and the role of brain, stomach, and gut hormones. Consideration of Mounjaro, but concerned about potential side effects, especially related to vision and kidney health. Discussion of alternative medication, bupropion , which targets brain-related hunger and has potential side effects including dry mouth, increased blood pressure, and productive energy. - Prescribe bupropion  for weight loss. - Schedule metabolism test with fasting, no exercise, nicotine, or caffeine before the test.  Type 2 diabetes mellitus Recent increase in hemoglobin A1c to 7.2% from previous 6.5%. Fasting blood sugars in the 120s to 130s, with occasional postprandial spikes to 180 mg/dL. Importance of maintaining A1c below 7% to prevent kidney and eye damage. Consideration of GLP-1 medications for postprandial glucose control, but concerns about side  effects. Need for more precise metabolic data to guide treatment. - Refill metformin  prescription. - Schedule metabolism test to assess current metabolic rate. - Encourage monitoring of postprandial blood sugars two to three times a week.  Vitamin D  deficiency Recent level of 35.9 ng/mL, improving but not yet at goal of 50 ng/mL. - Refill prescription vitamin D  50,000 IU weekly.  Hypertension Currently well-controlled with atenolol , losartan, and amlodipine . Blood pressure at 129/75 mmHg. - Continue diet, exercise and weight loss as discussed today as an important part of the treatment plan      She was informed of the importance of frequent follow up visits to maximize her success with intensive lifestyle modifications for her multiple health conditions.    Louann Penton, MD

## 2024-08-08 ENCOUNTER — Encounter: Admitting: Rehabilitative and Restorative Service Providers"

## 2024-08-12 NOTE — Therapy (Signed)
 OUTPATIENT OCCUPATIONAL THERAPY TREATMENT & PROGRESS AND DISCHARGE NOTE   Patient Name: Cynthia Short MRN: [id number] DOB:05/05/1956, 68 y.o., female Today's Date: 08/17/2024  PCP: Hugh HAMS MD REFERRING PROVIDER: Joane Artist RAMAN, MD                   OCCUPATIONAL THERAPY DISCHARGE SUMMARY  Visits from Start of Care: 4  Current functional level related to goals / functional outcomes:  CLINICAL IMPRESSION:  08/17/24: She has made significant progress toward each of the her long-term goals despite not being seen over the past 4 weeks in therapy. She has been using the OT plan and self-managing. This seems to show that she is making improvements even though her problems are somewhat chronic and could potentially get worse.  OT did discuss with her that if conservative treatment should ever fail, she can seek surgery for trigger finger release.  At this juncture, it was not recommended as she is only doing better and better, having less pain, having more function, having better grip strength and keeping her pain levels low.  She is pleased with her progress and agrees to discharge today.    PLAN:  OT FREQUENCY: 1 final visit to over the last session  OT DURATION: 2 additional weeks from 08/05/2024 - 08/17/24 and up to 4  total visits  Education / Equipment: Pt has all needed materials and education. Pt understands how to continue on with self-management. See tx notes for more details.   Patient agrees to discharge due to max benefits received from outpatient occupational therapy / hand therapy at this time.   Melvenia Ada, OTR/L, CHT 08/17/24                 END OF SESSION:  OT End of Session - 08/17/24 1428     Visit Number 4    Number of Visits 10    Date for OT Re-Evaluation 08/05/24    Authorization Type [employer]    OT Start Time 1429    OT Stop Time 1510    OT Time Calculation (min) 41 min    Activity Tolerance Patient tolerated  treatment well;No increased pain;Patient limited by fatigue;Patient limited by pain    Behavior During Therapy [facility] for tasks assessed/performed           Past Medical History:  Diagnosis Date   Anemia    Anxiety    Arthritis    Asthma    B12 deficiency    Chest pain    Constipation    Diabetes mellitus    Dyspnea    Gallbladder problem    GERD (gastroesophageal reflux disease)    Hair loss    Heart murmur    Heart valve problem    Hiatal hernia    Hip problem    Hyperlipemia    Hypertension    Hypothyroidism    IBS (irritable bowel syndrome)    Joint pain    Knee problem    Leg edema    Osteoarthritis    Thyroid  disease    hypothyroidism   Tricuspid valve disorder    Vitamin D  deficiency    Past Surgical History:  Procedure Laterality Date   ABDOMINAL HYSTERECTOMY  3/98   CHOLECYSTECTOMY  02/2006   KNEE SURGERY     MYOMECTOMY  12/93   uterine fibroids   TMJ ARTHROPLASTY  8/82   Patient Active Problem List   Diagnosis Date Noted   Age-related macular degeneration 07/25/2024  Allergic rhinitis 07/25/2024   Diabetic retinopathy (HCC) 07/25/2024   Disorder of carotid artery (HCC) 07/25/2024   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   Endometriosis 07/25/2024   Hyperglycemia due to type 2 diabetes mellitus (HCC) 07/25/2024   Mitral valve prolapse 07/25/2024   Proteinuria 07/25/2024   Steatosis of liver 07/25/2024   Morbid obesity (HCC) 07/25/2024   Gastroesophageal reflux disease 07/25/2024   Hypercholesterolemia 07/25/2024   Vitamin B12 deficiency (non anemic) 07/25/2024   Diverticulitis 03/24/2024   Statin intolerance 03/24/2024   Localized edema 11/05/2023   Acute meniscal tear, medial 10/23/2023   Arthralgia of left knee 09/23/2023   Microscopic hematuria 09/10/2023   Recurrent UTI 09/10/2023   Mixed hyperlipidemia 08/12/2023   Stress 07/14/2023   Other constipation 03/12/2023   Cough 02/10/2023   BMI 39.0-39.9,adult 02/10/2023   Obesity, Beginning  BMI 39.86 02/10/2023   Other insomnia 09/09/2022   Essential hypertension 08/06/2022   Vitamin B 12 deficiency 08/06/2022   Diabetes mellitus (HCC) 07/09/2022   Vitamin D  deficiency 05/01/2022   B12 deficiency 05/01/2022   Asthma 10/17/2021   Heart murmur 10/17/2021   Hypothyroidism 10/17/2021   Hiatal hernia 10/17/2021   Neck pain 12/25/2017   Dyspnea on exertion 07/15/2012   Class 2 severe obesity with serious comorbidity and body mass index (BMI) of 39.0 to 39.9 in adult [facility]) 07/15/2012   GERD (gastroesophageal reflux disease) 07/15/2012   Type 2 diabetes mellitus with obesity (HCC) 07/15/2012   Hypertension 07/15/2012    ONSET DATE: Approx 3-4 month onset of bil had pain   REFERRING DIAG: M79.641,M79.642 (ICD-10-CM) - Bilateral hand pain   THERAPY DIAG:  Localized edema  Other lack of coordination  Pain in left hand  Paresthesia of skin  Pain in right hand  Muscle weakness (generalized)  Rationale for Evaluation and Treatment: Rehabilitation  PERTINENT HISTORY:  Per MD note bilat hand pain ongoing intermittently since March. Pain is varying, started in the L, resolved, move to the R, resolved, now in both hands. Pt locates pain to across the heads of the [facility] and IP joints of all fingers. She also notes trigger of the 3rd-4th fingers bilaterally and limited finger flexion.  She states 3-4 month onset of multiple issues including pain starting in left hand, moving over to the right hand with unknown activities.   Gets some numbness in the night, has locking in digits 4 and 5 bilaterally.  She also states a history of some other issues like diabetes and vitamin deficiencies and a family history of similar issues.  PRECAUTIONS: None  RED FLAGS: None   WEIGHT BEARING RESTRICTIONS: No  SUBJECTIVE:   SUBJECTIVE STATEMENT: She arrives after 3 to 4 weeks of self-management of triggering fingers, arthritis, paresthesias and pain in both hands, left worse than right.   She states having minimal to no pain at rest now, triggering less often, being more active, but also being more mindful about her body and body positions.  She arrives with her husband again today.     PAIN:  Are you having pain? Yes: NPRS scale: 1/10 at rest, at worst in past week up to 6-7/10 a couple times when triggering Pain location: Bilateral MCP joints especially digits 4 and 5 Pain description: Aching and sometimes sharp Aggravating factors: Tight gripping Relieving factors: Rest   PATIENT GOALS: To improve bilateral hand use and pain syndromes  NEXT MD VISIT: As needed    OBJECTIVE: (All objective assessments below are from initial evaluation on: 06/22/24 unless otherwise specified.)  HAND DOMINANCE: Right   ADLs: Overall ADLs: States decreased ability to grab, hold household objects, pain and difficulty to open containers, perform FMS tasks (manipulate fasteners on clothing), mild to moderate bathing problems as well.    FUNCTIONAL OUTCOME MEASURES: 08/17/24: PSFS: 5.75  07/25/24: PSFS 4.6    Eval: Patient Specific Functional Scale: 3.75 (opening jars, drying hair, grasping objects, writing)  (Higher Score  =  Better Ability for the Selected Tasks)       UPPER EXTREMITY ROM     Shoulder to Wrist AROM Right eval Left eval Rt  / Lt  07/15/24 Rt  / Lt  07/25/24 Rt / Lt  08/17/24  Shoulder flexion       Shoulder abduction       Shoulder extension       Shoulder internal rotation       Shoulder external rotation       Elbow flexion       Elbow extension       Forearm supination       Forearm pronation        Wrist flexion 66 60 72  /  60 75  /  66 69   /   60  Wrist extension 60 60 55  / 51 59  /  56 58  /  63   Wrist ulnar deviation       Wrist radial deviation       Functional dart thrower's motion (F-DTM) in ulnar flexion       F-DTM in radial extension        (Blank rows = not tested)  Evaluation: Right hand ring finger was considered the most stiff  and most painful, so that was taken as a measure to be tracked throughout therapy sessions to look for improvement.  There was no time to measure all of her stiff fingers today during eval.  (This is also unnecessary.)  Hand AROM Right eval Rt 07/15/24 Rt 08/17/24  Full Fist Ability (or Gap to Distal Palmar Crease) Unable due to pain and stiffness and triggering    Thumb Opposition  (Kapandji Scale)      Thumb MCP (0-60)     Thumb IP (0-80)     Thumb Radial Abduction Span     Thumb Palmar Abduction Span     Index MCP (0-90)     Index PIP (0-100)     Index DIP (0-70)      Long MCP (0-90)      Long PIP (0-100)      Long DIP (0-70)      Ring MCP (0-90) 0- 57  0 - 54 0 - 56  Ring PIP (0-100) 0-  84 0 - 89 0 - 88  Ring DIP (0-70) 0-  27 0- 33 0 - 38  Little MCP (0-90)      Little PIP (0-100)      Little DIP (0-70)      (Blank rows = not tested)   HAND FUNCTION: 08/17/24: Grip Rt: 19.6#., Lt: 14.8#  07/25/24: Grip Rt: 14#, Lt: 11#    07/15/24 Grip strength Right: 14 lbs tender , Left: 5 lbs pain    TODAY'S TREATMENT:  08/17/24: She starts with active range of motion for exercise as well as new measures which shows that she has either maintained her motion and ability, or slightly improved it.  Her grip strength is significantly up since last seen, and she rates her functional ability  better than when last seen, and of course much better than the initial start of care.  We discussed management strategies/safety/self-care for trigger finger as well as paresthesia, as well as arthritis and stiffness and pain.  She states understanding these, trying to use larger grips on things, wearing gloves or Band-Aids around the PIP joints to prevent wear and tear and also triggering of fingers.  She states that she does feel like she has a strategy for each problem that she has.  We also briefly discussed systemic inflammation and the need to continue to work with with other medical providers to keep blood  sugar levels in normal limits, keep systemic inflammation down.  We review her home exercise program together, and she states understanding the stretches and exercises which we have assigned in therapy and that she can manage these on her own now.  Her goals were reviewed with her and her husband and she has made significant progress toward each of the her long-term goals despite not being seen over the past 3 to 4 weeks in therapy.  This seems to show that she is making improvements even though her problems are somewhat chronic and could potentially get worse.  OT did discuss with her that if conservative treatment should ever fail, she can seek surgery for trigger finger release.  At this juncture, it was not recommended as she is only doing better and better, having less pain, having more function, having better grip strength and keeping her pain levels low.  She is pleased with her progress and agrees to discharge today.   Exercises performed/reviewed today: - Wrist Flexion Stretch  - 4 x daily - 2-3 reps - 15 sec hold - Wrist Prayer Stretch  - 4 x daily - 3-5 reps - 15 sec hold - BACK KNUCKLE STRETCHES   - 4 x daily - 3-5 reps - 15 sec hold - HOOK Stretch  - 4 x daily - 3-5 reps - 15-20 sec hold - Median Nerve Flossing  - 2-3 x daily - 5-10 reps    PATIENT EDUCATION: Education details: See tx section above for details  Person educated: Patient Education method: Verbal Instruction, Teach back, Handouts  Education comprehension: States and demonstrates understanding   HOME EXERCISE PROGRAM: Access Code: [email or web address] URL: [email or web address] Date: 06/22/2024 Prepared by: Melvenia Ada   GOALS: Goals reviewed with patient? Yes   SHORT TERM GOALS: (STG required if POC>30 days) Target Date: 07/01/24  Pt will obtain protective, custom orthotic. Goal status: 08/17/24: n/a d/c   2.  Pt will demo/state understanding of initial HEP to improve pain levels and prerequisite  motion. Goal status: 07/15/2024: Met   LONG TERM GOALS: Target Date: 08/05/24  Pt will improve functional ability by decreased impairment per PSFS assessment from 3.75 to 6 or better, for better quality of life. Goal status: 08/17/24: 5.75  2.  Pt will improve grip strength in bilateral hands to at least nonpainful 35 lbs for functional use at home and in IADLs. Goal status:08/17/24: Not met but significantly improved   3.  Pt will improve A/ROM in Rt RF TAM from 168* to at least 200*, to have functional motion for tasks like reach and grasp.  Goal status: 08/17/24: improved to 182*   4.  Pt will decrease pain at worst from 8-9/10 to 3/10 or better to have better sleep and occupational participation in daily roles. Goal status: 08/17/24: still having spikes of pain up to 7-8/10 but less often and now no  significant resting pain    ASSESSMENT:  CLINICAL IMPRESSION:  08/17/24: She has made significant progress toward each of the her long-term goals despite not being seen over the past 3 to 4 weeks in therapy. She has been using the OT plan and self-managing. This seems to show that she is making improvements even though her problems are somewhat chronic and could potentially get worse.  OT did discuss with her that if conservative treatment should ever fail, she can seek surgery for trigger finger release.  At this juncture, it was not recommended as she is only doing better and better, having less pain, having more function, having better grip strength and keeping her pain levels low.  She is pleased with her progress and agrees to discharge today.    PLAN:  OT FREQUENCY: 1 final visit to over the last session  OT DURATION: 2 additional weeks from 08/05/2024 - 08/17/24 and up to 4  total visits  PLANNED INTERVENTIONS: 97535 self care/ADL training, 02889 therapeutic exercise, 97530 therapeutic activity, 97112 neuromuscular re-education, 97140 manual therapy, 97035 ultrasound, 97032 electrical  stimulation (manual), 97760 Orthotic Initial, [id number] Orthotic/Prosthetic subsequent, compression bandaging, Dry needling, energy conservation, coping strategies training, and patient/family education  CONSULTED AND AGREED WITH PLAN OF CARE: Patient  PLAN FOR NEXT SESSION:   N/A, D/C   Melvenia Ada, OTR/L, CHT  08/17/2024, 5:07 PM

## 2024-08-16 ENCOUNTER — Encounter: Admitting: Rehabilitative and Restorative Service Providers"

## 2024-08-16 ENCOUNTER — Other Ambulatory Visit (INDEPENDENT_AMBULATORY_CARE_PROVIDER_SITE_OTHER): Payer: Self-pay | Admitting: Family Medicine

## 2024-08-16 DIAGNOSIS — E559 Vitamin D deficiency, unspecified: Secondary | ICD-10-CM

## 2024-08-17 ENCOUNTER — Encounter: Payer: Self-pay | Admitting: Rehabilitative and Restorative Service Providers"

## 2024-08-17 ENCOUNTER — Ambulatory Visit (INDEPENDENT_AMBULATORY_CARE_PROVIDER_SITE_OTHER): Admitting: Rehabilitative and Restorative Service Providers"

## 2024-08-17 DIAGNOSIS — R600 Localized edema: Secondary | ICD-10-CM

## 2024-08-17 DIAGNOSIS — M6281 Muscle weakness (generalized): Secondary | ICD-10-CM | POA: Diagnosis not present

## 2024-08-17 DIAGNOSIS — R278 Other lack of coordination: Secondary | ICD-10-CM | POA: Diagnosis not present

## 2024-08-17 DIAGNOSIS — R202 Paresthesia of skin: Secondary | ICD-10-CM

## 2024-08-17 DIAGNOSIS — M25561 Pain in right knee: Secondary | ICD-10-CM | POA: Diagnosis not present

## 2024-08-17 DIAGNOSIS — M79642 Pain in left hand: Secondary | ICD-10-CM

## 2024-08-17 DIAGNOSIS — M79641 Pain in right hand: Secondary | ICD-10-CM

## 2024-08-19 ENCOUNTER — Other Ambulatory Visit (INDEPENDENT_AMBULATORY_CARE_PROVIDER_SITE_OTHER): Payer: Self-pay | Admitting: Family Medicine

## 2024-08-25 DIAGNOSIS — R300 Dysuria: Secondary | ICD-10-CM | POA: Diagnosis not present

## 2024-08-25 DIAGNOSIS — N3091 Cystitis, unspecified with hematuria: Secondary | ICD-10-CM | POA: Diagnosis not present

## 2024-08-26 ENCOUNTER — Other Ambulatory Visit (INDEPENDENT_AMBULATORY_CARE_PROVIDER_SITE_OTHER): Payer: Self-pay | Admitting: Family Medicine

## 2024-08-26 DIAGNOSIS — E559 Vitamin D deficiency, unspecified: Secondary | ICD-10-CM

## 2024-09-11 ENCOUNTER — Encounter (INDEPENDENT_AMBULATORY_CARE_PROVIDER_SITE_OTHER): Payer: Self-pay | Admitting: Family Medicine

## 2024-09-11 DIAGNOSIS — E559 Vitamin D deficiency, unspecified: Secondary | ICD-10-CM

## 2024-09-12 ENCOUNTER — Ambulatory Visit (INDEPENDENT_AMBULATORY_CARE_PROVIDER_SITE_OTHER): Admitting: Family Medicine

## 2024-09-12 MED ORDER — BUPROPION HCL ER (SR) 150 MG PO TB12: 150.0000 mg | ORAL_TABLET | Freq: Every day | ORAL | 0 refills | Status: DC

## 2024-09-12 MED ORDER — VITAMIN D (ERGOCALCIFEROL) 1.25 MG (50000 UNIT) PO CAPS
50000.0000 [IU] | ORAL_CAPSULE | ORAL | 0 refills | Status: DC
Start: 2024-09-12 — End: 2024-10-31

## 2024-09-12 NOTE — Telephone Encounter (Signed)
 LAST APPOINTMENT DATE: 07/27/2024 NEXT APPOINTMENT DATE: 10/31/2024   [id number] - [place], [place] - [address] [place] [address] Phone: [phone] Fax: [phone]  Patient is requesting a refill of the following medications: Requested Prescriptions   Pending Prescriptions Disp Refills   Vitamin D , Ergocalciferol , (DRISDOL ) 1.25 MG (50000 UNIT) CAPS capsule 4 capsule 0    Sig: Take 1 capsule (50,000 Units total) by mouth every 7 (seven) days.   buPROPion  (WELLBUTRIN  SR) 150 MG 12 hr tablet 30 tablet 0    Sig: Take 1 tablet (150 mg total) by mouth daily.    Date last filled: 07/27/2024 Previously prescribed by Dr Verdon  Lab Results  Component Value Date   HGBA1C 7.2 (H) 06/01/2024   HGBA1C 6.5 (H) 08/12/2023   HGBA1C 6.6 (H) 11/26/2021   Lab Results  Component Value Date   MICROALBUR <0.7 06/08/2020   LDLCALC 86 08/12/2023   CREATININE 0.67 06/01/2024   Lab Results  Component Value Date   VD25OH 35.9 06/01/2024   VD25OH 22.6 (L) 08/12/2023   VD25OH 25.5 (L) 05/01/2022    BP Readings from Last 3 Encounters:  07/27/24 129/75  07/06/24 (!) 140/84  06/07/24 136/80

## 2024-09-12 NOTE — Telephone Encounter (Signed)
 Good morning!  Patient will need a Vitamin D  refill to the same pharmacy.  Thanks!

## 2024-09-13 DIAGNOSIS — E039 Hypothyroidism, unspecified: Secondary | ICD-10-CM | POA: Diagnosis not present

## 2024-09-13 DIAGNOSIS — R300 Dysuria: Secondary | ICD-10-CM | POA: Diagnosis not present

## 2024-09-13 DIAGNOSIS — I10 Essential (primary) hypertension: Secondary | ICD-10-CM | POA: Diagnosis not present

## 2024-09-13 DIAGNOSIS — E538 Deficiency of other specified B group vitamins: Secondary | ICD-10-CM | POA: Diagnosis not present

## 2024-09-13 DIAGNOSIS — E1165 Type 2 diabetes mellitus with hyperglycemia: Secondary | ICD-10-CM | POA: Diagnosis not present

## 2024-09-13 DIAGNOSIS — E7800 Pure hypercholesterolemia, unspecified: Secondary | ICD-10-CM | POA: Diagnosis not present

## 2024-09-13 DIAGNOSIS — E559 Vitamin D deficiency, unspecified: Secondary | ICD-10-CM | POA: Diagnosis not present

## 2024-09-15 DIAGNOSIS — E039 Hypothyroidism, unspecified: Secondary | ICD-10-CM | POA: Diagnosis not present

## 2024-09-15 DIAGNOSIS — K219 Gastro-esophageal reflux disease without esophagitis: Secondary | ICD-10-CM | POA: Diagnosis not present

## 2024-09-15 DIAGNOSIS — E559 Vitamin D deficiency, unspecified: Secondary | ICD-10-CM | POA: Diagnosis not present

## 2024-09-15 DIAGNOSIS — E538 Deficiency of other specified B group vitamins: Secondary | ICD-10-CM | POA: Diagnosis not present

## 2024-09-15 DIAGNOSIS — S83242D Other tear of medial meniscus, current injury, left knee, subsequent encounter: Secondary | ICD-10-CM | POA: Diagnosis not present

## 2024-09-15 DIAGNOSIS — E1129 Type 2 diabetes mellitus with other diabetic kidney complication: Secondary | ICD-10-CM | POA: Diagnosis not present

## 2024-09-15 DIAGNOSIS — J069 Acute upper respiratory infection, unspecified: Secondary | ICD-10-CM | POA: Diagnosis not present

## 2024-09-15 DIAGNOSIS — I10 Essential (primary) hypertension: Secondary | ICD-10-CM | POA: Diagnosis not present

## 2024-09-15 DIAGNOSIS — E1165 Type 2 diabetes mellitus with hyperglycemia: Secondary | ICD-10-CM | POA: Diagnosis not present

## 2024-09-15 DIAGNOSIS — R300 Dysuria: Secondary | ICD-10-CM | POA: Diagnosis not present

## 2024-09-15 DIAGNOSIS — E7800 Pure hypercholesterolemia, unspecified: Secondary | ICD-10-CM | POA: Diagnosis not present

## 2024-09-15 DIAGNOSIS — M79643 Pain in unspecified hand: Secondary | ICD-10-CM | POA: Diagnosis not present

## 2024-09-15 DIAGNOSIS — M1711 Unilateral primary osteoarthritis, right knee: Secondary | ICD-10-CM | POA: Diagnosis not present

## 2024-09-15 DIAGNOSIS — J45909 Unspecified asthma, uncomplicated: Secondary | ICD-10-CM | POA: Diagnosis not present

## 2024-10-03 DIAGNOSIS — H25813 Combined forms of age-related cataract, bilateral: Secondary | ICD-10-CM | POA: Diagnosis not present

## 2024-10-03 DIAGNOSIS — H3530 Unspecified macular degeneration: Secondary | ICD-10-CM | POA: Diagnosis not present

## 2024-10-03 DIAGNOSIS — Z7984 Long term (current) use of oral hypoglycemic drugs: Secondary | ICD-10-CM | POA: Diagnosis not present

## 2024-10-03 DIAGNOSIS — H353131 Nonexudative age-related macular degeneration, bilateral, early dry stage: Secondary | ICD-10-CM | POA: Diagnosis not present

## 2024-10-03 DIAGNOSIS — E119 Type 2 diabetes mellitus without complications: Secondary | ICD-10-CM | POA: Diagnosis not present

## 2024-10-05 ENCOUNTER — Other Ambulatory Visit: Payer: Self-pay | Admitting: Obstetrics and Gynecology

## 2024-10-05 DIAGNOSIS — Z1231 Encounter for screening mammogram for malignant neoplasm of breast: Secondary | ICD-10-CM

## 2024-10-15 ENCOUNTER — Other Ambulatory Visit (INDEPENDENT_AMBULATORY_CARE_PROVIDER_SITE_OTHER): Payer: Self-pay | Admitting: Family Medicine

## 2024-10-15 DIAGNOSIS — E559 Vitamin D deficiency, unspecified: Secondary | ICD-10-CM

## 2024-10-20 ENCOUNTER — Encounter: Payer: Self-pay | Admitting: Podiatry

## 2024-10-20 ENCOUNTER — Ambulatory Visit (INDEPENDENT_AMBULATORY_CARE_PROVIDER_SITE_OTHER)

## 2024-10-20 ENCOUNTER — Ambulatory Visit (INDEPENDENT_AMBULATORY_CARE_PROVIDER_SITE_OTHER): Admitting: Podiatry

## 2024-10-20 VITALS — Ht 63.0 in | Wt 224.0 lb

## 2024-10-20 DIAGNOSIS — M722 Plantar fascial fibromatosis: Secondary | ICD-10-CM | POA: Diagnosis not present

## 2024-10-20 DIAGNOSIS — M7731 Calcaneal spur, right foot: Secondary | ICD-10-CM

## 2024-10-20 DIAGNOSIS — M7751 Other enthesopathy of right foot: Secondary | ICD-10-CM

## 2024-10-20 DIAGNOSIS — M79671 Pain in right foot: Secondary | ICD-10-CM | POA: Diagnosis not present

## 2024-10-20 DIAGNOSIS — M7661 Achilles tendinitis, right leg: Secondary | ICD-10-CM

## 2024-10-20 NOTE — Patient Instructions (Signed)
 -  You can use a NIGHT SPLINT as well  -  Plantar Fasciitis (Heel Spur Syndrome) with Rehab The plantar fascia is a fibrous, ligament-like, soft-tissue structure that spans the bottom of the foot. Plantar fasciitis is a condition that causes pain in the foot due to inflammation of the tissue. SYMPTOMS  Pain and tenderness on the underneath side of the foot. Pain that worsens with standing or walking. CAUSES  Plantar fasciitis is caused by irritation and injury to the plantar fascia on the underneath side of the foot. Common mechanisms of injury include: Direct trauma to bottom of the foot. Damage to a small nerve that runs under the foot where the main fascia attaches to the heel bone. Stress placed on the plantar fascia due to bone spurs. RISK INCREASES WITH:  Activities that place stress on the plantar fascia (running, jumping, pivoting, or cutting). Poor strength and flexibility. Improperly fitted shoes. Tight calf muscles. Flat feet. Failure to warm-up properly before activity. Obesity. PREVENTION Warm up and stretch properly before activity. Allow for adequate recovery between workouts. Maintain physical fitness: Strength, flexibility, and endurance. Cardiovascular fitness. Maintain a health body weight. Avoid stress on the plantar fascia. Wear properly fitted shoes, including arch supports for individuals who have flat feet.  PROGNOSIS  If treated properly, then the symptoms of plantar fasciitis usually resolve without surgery. However, occasionally surgery is necessary.  RELATED COMPLICATIONS  Recurrent symptoms that may result in a chronic condition. Problems of the lower back that are caused by compensating for the injury, such as limping. Pain or weakness of the foot during push-off following surgery. Chronic inflammation, scarring, and partial or complete fascia tear, occurring more often from repeated injections.  TREATMENT  Treatment initially involves  the use of ice and medication to help reduce pain and inflammation. The use of strengthening and stretching exercises may help reduce pain with activity, especially stretches of the Achilles tendon. These exercises may be performed at home or with a therapist. Your caregiver may recommend that you use heel cups of arch supports to help reduce stress on the plantar fascia. Occasionally, corticosteroid injections are given to reduce inflammation. If symptoms persist for greater than 6 months despite non-surgical (conservative), then surgery may be recommended.   MEDICATION  If pain medication is necessary, then nonsteroidal anti-inflammatory medications, such as aspirin and ibuprofen, or other minor pain relievers, such as acetaminophen, are often recommended. Do not take pain medication within 7 days before surgery. Prescription pain relievers may be given if deemed necessary by your caregiver. Use only as directed and only as much as you need. Corticosteroid injections may be given by your caregiver. These injections should be reserved for the most serious cases, because they may only be given a certain number of times.  HEAT AND COLD Cold treatment (icing) relieves pain and reduces inflammation. Cold treatment should be applied for 10 to 15 minutes every 2 to 3 hours for inflammation and pain and immediately after any activity that aggravates your symptoms. Use ice packs or massage the area with a piece of ice (ice massage). Heat treatment may be used prior to performing the stretching and strengthening activities prescribed by your caregiver, physical therapist, or athletic trainer. Use a heat pack or soak the injury in warm water.  SEEK IMMEDIATE MEDICAL CARE IF: Treatment seems to offer no benefit, or the condition worsens. Any medications produce adverse side effects.  EXERCISES- RANGE OF MOTION (ROM) AND STRETCHING EXERCISES - Plantar Fasciitis (Heel Spur  Syndrome) These exercises may help  you when beginning to rehabilitate your injury. Your symptoms may resolve with or without further involvement from your physician, physical therapist or athletic trainer. While completing these exercises, remember:  Restoring tissue flexibility helps normal motion to return to the joints. This allows healthier, less painful movement and activity. An effective stretch should be held for at least 30 seconds. A stretch should never be painful. You should only feel a gentle lengthening or release in the stretched tissue.  RANGE OF MOTION - Toe Extension, Flexion Sit with your right / left leg crossed over your opposite knee. Grasp your toes and gently pull them back toward the top of your foot. You should feel a stretch on the bottom of your toes and/or foot. Hold this stretch for 10 seconds. Now, gently pull your toes toward the bottom of your foot. You should feel a stretch on the top of your toes and or foot. Hold this stretch for 10 seconds. Repeat  times. Complete this stretch 3 times per day.   RANGE OF MOTION - Ankle Dorsiflexion, Active Assisted Remove shoes and sit on a chair that is preferably not on a carpeted surface. Place right / left foot under knee. Extend your opposite leg for support. Keeping your heel down, slide your right / left foot back toward the chair until you feel a stretch at your ankle or calf. If you do not feel a stretch, slide your bottom forward to the edge of the chair, while still keeping your heel down. Hold this stretch for 10 seconds. Repeat 3 times. Complete this stretch 2 times per day.   STRETCH  Gastroc, Standing Place hands on wall. Extend right / left leg, keeping the front knee somewhat bent. Slightly point your toes inward on your back foot. Keeping your right / left heel on the floor and your knee straight, shift your weight toward the wall, not allowing your back to arch. You should feel a gentle stretch in the right / left calf. Hold this position  for 10 seconds. Repeat 3 times. Complete this stretch 2 times per day.  STRETCH  Soleus, Standing Place hands on wall. Extend right / left leg, keeping the other knee somewhat bent. Slightly point your toes inward on your back foot. Keep your right / left heel on the floor, bend your back knee, and slightly shift your weight over the back leg so that you feel a gentle stretch deep in your back calf. Hold this position for 10 seconds. Repeat 3 times. Complete this stretch 2 times per day.  STRETCH  Gastrocsoleus, Standing  Note: This exercise can place a lot of stress on your foot and ankle. Please complete this exercise only if specifically instructed by your caregiver.  Place the ball of your right / left foot on a step, keeping your other foot firmly on the same step. Hold on to the wall or a rail for balance. Slowly lift your other foot, allowing your body weight to press your heel down over the edge of the step. You should feel a stretch in your right / left calf. Hold this position for 10 seconds. Repeat this exercise with a slight bend in your right / left knee. Repeat 3 times. Complete this stretch 2 times per day.   STRENGTHENING EXERCISES - Plantar Fasciitis (Heel Spur Syndrome)  These exercises may help you when beginning to rehabilitate your injury. They may resolve your symptoms with or without further involvement from your  physician, physical therapist or athletic trainer. While completing these exercises, remember:  Muscles can gain both the endurance and the strength needed for everyday activities through controlled exercises. Complete these exercises as instructed by your physician, physical therapist or athletic trainer. Progress the resistance and repetitions only as guided.  STRENGTH - Towel Curls Sit in a chair positioned on a non-carpeted surface. Place your foot on a towel, keeping your heel on the floor. Pull the towel toward your heel by only curling your toes.  Keep your heel on the floor. Repeat 3 times. Complete this exercise 2 times per day.  STRENGTH - Ankle Inversion Secure one end of a rubber exercise band/tubing to a fixed object (table, pole). Loop the other end around your foot just before your toes. Place your fists between your knees. This will focus your strengthening at your ankle. Slowly, pull your big toe up and in, making sure the band/tubing is positioned to resist the entire motion. Hold this position for 10 seconds. Have your muscles resist the band/tubing as it slowly pulls your foot back to the starting position. Repeat 3 times. Complete this exercises 2 times per day.  Document Released: 12/01/2005 Document Revised: 02/23/2012 Document Reviewed: 03/15/2009 [facility] Patient Information 2014 [place], [place].

## 2024-10-23 NOTE — Progress Notes (Signed)
 Subjective:  Patient ID: Cynthia Short, female    DOB: [birth year],  MRN: [id number]  Chief Complaint  Patient presents with   Foot Pain    Pt is here due to right heel pain, states the pain has been there for 6 months, states becoming unmanageable.    Discussed the use of AI scribe software for clinical note transcription with the patient, who gave verbal consent to proceed.  History of Present Illness Cynthia Short is a 68 year old female with plantar fasciitis and heel spurs who presents with right heel pain.  She has experienced right heel pain for six months, with symptoms worsening recently. The pain is persistent, intensifying in the morning and after prolonged standing, located at the bottom and back of the heel, sometimes radiating up the back.  She has a history of plantar fasciitis and heel spurs in both heels, previously managed with monthly cortisone injections, which were effective but not recently administered. Current use of Voltaren cream and Tylenol has not provided significant relief. She is favoring the affected foot, leading to knee pain.  She has diabetes, with a recent A1c of 6.9% and morning blood sugar levels in the 120s to 130s mg/dL. Past cortisone injections have elevated her blood sugar levels. She uses Tylenol as needed and has been on metformin  for twelve years. No recent foot injuries. Pain is absent in the top of the foot.      Objective:  There were no vitals filed for this visit.  Physical Exam General: AAO x3, NAD  Dermatological: Skin is warm, dry and supple bilateral. There are no open sores, no preulcerative lesions, no rash or signs of infection present.  Vascular: Dorsalis Pedis artery and Posterior Tibial artery pedal pulses are 2/4 bilateral with immedate capillary fill time. There is no pain with calf compression, swelling, warmth, erythema.   Neruologic: Grossly intact via light touch bilateral.   Musculoskeletal: On the right  heel there is tenderness to palpation along the posterior aspect of heel.  Prominent bone spur as well as distal portion Achilles tendon.  There is also tenderness palpation of the plantar aspect of calcaneus on insertion of the plantar fascia.  There is no pain with lateral compression of the calcaneus.  Equinus is present.  No other areas of pinpoint tenderness.     Results LABS A1c: 6.9 (08/2024)  RADIOLOGY Foot X-ray: Multiple views obtained.  Calcaneal spurs and posterior calcaneal exostosis.  There is no evidence of acute fracture present.   Assessment:   1. Plantar fasciitis   2. Pain of right heel   3. Heel spur, right   4. Tendonitis, Achilles, right      Plan:  Patient was evaluated and treated and all questions answered.  Assessment and Plan Assessment & Plan Right heel pain due to plantar fasciitis and calcaneal spurs Chronic heel pain due to inflammation and mechanical stress on plantar fascia and Achilles tendon. Conservative management preferred due to diabetes and upcoming knee surgery. - Prescribed plantar fascial brace.  Discussed to support and stabilize the plantar fascia to facilitate healing and decrease pain. - Recommended shoes with arch support and slight heel. - Provided exercises for calf, Achilles, and plantar fascia stretching. - Recommend to use over-the-counter night splint use. -Discussed heel lifts. - Instructed on icing and use of Voltaren and Tylenol. - Discussed potential future injections post-surgery.  We held off on this today as she can have knee surgery and this typically increase her glucose  she reports.  Type 2 diabetes mellitus without complications Type 2 diabetes managed with metformin . A1c at 6.9%. Avoid oral steroids to prevent hyperglycemia, especially with upcoming surgery. - Continue metformin . - Monitor blood sugar levels closely. - Avoid oral steroids.    Return in about 2 months (around 12/20/2024).   Donnice JONELLE Fees  DPM

## 2024-10-24 ENCOUNTER — Ambulatory Visit (INDEPENDENT_AMBULATORY_CARE_PROVIDER_SITE_OTHER): Admitting: Family Medicine

## 2024-10-31 ENCOUNTER — Ambulatory Visit (INDEPENDENT_AMBULATORY_CARE_PROVIDER_SITE_OTHER): Payer: Self-pay | Admitting: Family Medicine

## 2024-10-31 ENCOUNTER — Encounter (INDEPENDENT_AMBULATORY_CARE_PROVIDER_SITE_OTHER): Payer: Self-pay | Admitting: Family Medicine

## 2024-10-31 VITALS — BP 123/74 | HR 67 | Temp 98.3°F | Ht 63.0 in | Wt 227.0 lb

## 2024-10-31 DIAGNOSIS — E1169 Type 2 diabetes mellitus with other specified complication: Secondary | ICD-10-CM | POA: Diagnosis not present

## 2024-10-31 DIAGNOSIS — Z794 Long term (current) use of insulin: Secondary | ICD-10-CM

## 2024-10-31 DIAGNOSIS — E669 Obesity, unspecified: Secondary | ICD-10-CM

## 2024-10-31 DIAGNOSIS — E559 Vitamin D deficiency, unspecified: Secondary | ICD-10-CM

## 2024-10-31 DIAGNOSIS — Z6841 Body Mass Index (BMI) 40.0 and over, adult: Secondary | ICD-10-CM | POA: Diagnosis not present

## 2024-10-31 DIAGNOSIS — R0602 Shortness of breath: Secondary | ICD-10-CM

## 2024-10-31 MED ORDER — VITAMIN D (ERGOCALCIFEROL) 1.25 MG (50000 UNIT) PO CAPS: 50000.0000 [IU] | ORAL_CAPSULE | ORAL | 0 refills | Status: DC

## 2024-10-31 MED ORDER — METFORMIN HCL ER 500 MG PO TB24: 500.0000 mg | ORAL_TABLET | Freq: Two times a day (BID) | ORAL | 0 refills | Status: AC

## 2024-10-31 NOTE — Progress Notes (Addendum)
 Office: [phone]  /  Fax: [phone]  WEIGHT SUMMARY AND BIOMETRICS  Anthropometric Measurements Height: 5' 3 (1.6 m) Weight: 227 lb (103 kg) BMI (Calculated): 40.22 Weight at Last Visit: 224 lb Weight Lost Since Last Visit: 0 Weight Gained Since Last Visit: 3 lb Starting Weight: 225 lb Total Weight Loss (lbs): 0 lb (0 kg)   Body Composition  Body Fat %: 49.2 % Fat Mass (lbs): 112 lbs Muscle Mass (lbs): 109.8 lbs Total Body Water (lbs): 81.8 lbs Visceral Fat Rating : 17   Other Clinical Data Fasting: yes Labs: yes Starting Date: 11/26/21    Chief Complaint: OBESITY    History of Present Illness Cynthia Short is a 68 year old female with obesity who presents for weight management.  She has not been following a structured eating plan but is trying to incorporate more fruits and vegetables into her diet. She is not consuming the recommended amount of protein and is not hydrating adequately. She is attempting to avoid skipping meals but is not currently engaging in any exercise. Over the past three months, she has gained three pounds since her last visit. She wants to lose weight but has struggled to make all the needed changes long term required for sustainable weight loss.  She experiences shortness of breath with exercise. Her previous resting metabolic rate was 1944, measured in December 2022.  She is being treated for type 2 diabetes and vitamin D  deficiency. Her current medications include metformin  XR 500 mg twice a day for diabetes and prescription ergocalciferol  50,000 international units weekly for vitamin D  deficiency. Her A1c was 6.9 in September, with recent morning blood sugar readings between 130s and 140s.  She has a history of emotional eating behaviors and was previously on Wellbutrin  SR 150 mg daily, which was discontinued due to personality changes and increased irritability.  She has a family history of diabetes and heart-related  complications. Her father passed away due to complications from diabetes, and her brother has recently been diagnosed with diabetes and rheumatoid arthritis.  She reports knee pain and has plantar fasciitis, which limits her ability to exercise. She is involved in family activities, including attending ball games and supporting her grandchildren's sports activities.      PHYSICAL EXAM:  Blood pressure 123/74, pulse 67, temperature 98.3 F (36.8 C), height 5' 3 (1.6 m), weight 227 lb (103 kg), SpO2 94%. Body mass index is 40.21 kg/m.  DIAGNOSTIC DATA REVIEWED:  BMET    Component Value Date/Time   NA 140 06/01/2024 1138   K 4.3 06/01/2024 1138   CL 100 06/01/2024 1138   CO2 23 06/01/2024 1138   GLUCOSE 107 (H) 06/01/2024 1138   BUN 14 06/01/2024 1138   CREATININE 0.67 06/01/2024 1138   CALCIUM 9.4 06/01/2024 1138   GFRNONAA 87 12/04/2020 0000   GFRAA 105 12/04/2020 0000   Lab Results  Component Value Date   HGBA1C 7.2 (H) 06/01/2024   HGBA1C 6.3 (H) 08/26/2018   Lab Results  Component Value Date   INSULIN  12.2 08/12/2023   INSULIN  14.9 08/26/2018   Lab Results  Component Value Date   TSH 2.160 08/12/2023   CBC    Component Value Date/Time   WBC 7.7 11/26/2021 1316   RBC 4.42 11/26/2021 1316   HGB 12.2 11/26/2021 1316   HCT 38.1 11/26/2021 1316   PLT 300 11/26/2021 1316   MCV 86 11/26/2021 1316   MCH 27.6 11/26/2021 1316   MCHC 32.0 11/26/2021 1316  RDW 13.8 11/26/2021 1316   Iron Studies No results found for: IRON, TIBC, FERRITIN, IRONPCTSAT Lipid Panel     Component Value Date/Time   CHOL 150 08/12/2023 1121   TRIG 88 08/12/2023 1121   HDL 47 08/12/2023 1121   CHOLHDL 3.9 11/26/2021 1316   LDLCALC 86 08/12/2023 1121   Hepatic Function Panel     Component Value Date/Time   PROT 7.0 06/01/2024 1138   ALBUMIN 4.2 06/01/2024 1138   AST 16 06/01/2024 1138   ALT 17 06/01/2024 1138   ALKPHOS 90 06/01/2024 1138   BILITOT 0.3 06/01/2024 1138       Component Value Date/Time   TSH 2.160 08/12/2023 1121   Nutritional Lab Results  Component Value Date   VD25OH 35.9 06/01/2024   VD25OH 22.6 (L) 08/12/2023   VD25OH 25.5 (L) 05/01/2022     Assessment and Plan Assessment & Plan 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 She has gained three pounds in the last three months. Not following a structured eating plan, struggles with protein intake and hydration, and is not exercising. Emotional eating behaviors previously treated with Wellbutrin  SR, which was discontinued due to irritability. Discussed potential use of Mounjaro for weight management as well as glucose control, noting fewer GI issues compared to Ozempic . Discussed weight loss surgery options, including gastric sleeve and gastric bypass, but she is not currently interested in surgical intervention. Emphasized the importance of sustainable lifestyle changes and the role of exercise in increasing metabolic rate. - Encouraged structured eating plan with increased protein intake and adequate hydration. - Encouraged exercise as tolerated, considering knee pain and plantar fasciitis. - Discussed potential use of Mounjaro for weight management post-surgery. - Discussed weight loss surgery options, including gastric sleeve and gastric bypass, but deferred decision. - Agreed to go back to journaling with a calorie goal of 1300-1500 and a protein goal of 85+ gm per day.  Type 2 diabetes mellitus Recent A1c of 6.9%, down from over 7.0. Morning blood glucose readings have been elevated, ranging from 130s to 140s. Discussed the importance of monitoring postprandial glucose levels to assess A1c trends. Current management includes metformin  XR 500 mg twice daily. Discussed potential impact of lifestyle changes on A1c levels, especially with upcoming knee surgery. - Continue metformin  XR 500 mg twice daily. - Monitor blood glucose levels, especially  postprandial readings. - Encouraged lifestyle modifications to improve glycemic control.  Vitamin D  deficiency Managed with prescription ergocalciferol  50,000 IU weekly. - Continue ergocalciferol  50,000 IU weekly.  Shortness of breath with exertion Likely due to exercise intolerance. No changes in symptoms since last visit. IC done today lower than previously, RMR 1814 Slowly increase exercise as well as work on weight loss and will continue to monitor       I personally spent a total of 58 minutes in the care of the patient today including preparing to see the patient, performing a medically appropriate evaluation of current problems, placing orders in the EMR, documenting clinical information in the EMR, customized nutritional counseling for their specific health and social needs, independently interpreting results, discussing results with the patient and educating them on how these results can affect their health and weight, explaining the pathophysiology of obesity and how it is significantly more complex than eat less and exercise more, discussing emotional eating behaviors and how to make strategies to change these behaviors, discussing strategies to help avoid social eating challenges including over the holidays, and education on how to keep an  appropriate food journal and the importance of keeping her food simple and easy to journal. See above note for additional education given today.  Shatiqua was counseled on the importance of maintaining healthy lifestyle habits, including balanced nutrition, regular physical activity, and behavioral modifications, while taking antiobesity medication.  Patient verbalized understanding that medication is an adjunct to, not a replacement for, lifestyle changes and that the long-term success and weight maintenance depend on continued adherence to these strategies.   Leahann was informed of the importance of frequent follow up visits to maximize her success  with intensive lifestyle modifications for her obesity and obesity related health conditions as recommended by USPSTF and CMS guidelines   Louann Penton, MD

## 2024-10-31 NOTE — Addendum Note (Signed)
 Addended by: LAFE BAKER CROME on: 10/31/2024 02:54 PM   Modules accepted: Orders

## 2024-11-21 ENCOUNTER — Ambulatory Visit (INDEPENDENT_AMBULATORY_CARE_PROVIDER_SITE_OTHER): Payer: Self-pay | Admitting: Family Medicine

## 2024-11-22 ENCOUNTER — Ambulatory Visit

## 2024-12-01 ENCOUNTER — Other Ambulatory Visit (INDEPENDENT_AMBULATORY_CARE_PROVIDER_SITE_OTHER): Payer: Self-pay | Admitting: Family Medicine

## 2024-12-01 DIAGNOSIS — E559 Vitamin D deficiency, unspecified: Secondary | ICD-10-CM

## 2024-12-27 ENCOUNTER — Ambulatory Visit
Admission: RE | Admit: 2024-12-27 | Discharge: 2024-12-27 | Disposition: A | Source: Ambulatory Visit | Attending: Obstetrics and Gynecology | Admitting: Obstetrics and Gynecology

## 2024-12-27 DIAGNOSIS — Z1231 Encounter for screening mammogram for malignant neoplasm of breast: Secondary | ICD-10-CM

## 2024-12-28 ENCOUNTER — Ambulatory Visit (INDEPENDENT_AMBULATORY_CARE_PROVIDER_SITE_OTHER): Admitting: Family Medicine

## 2024-12-28 ENCOUNTER — Encounter (INDEPENDENT_AMBULATORY_CARE_PROVIDER_SITE_OTHER): Payer: Self-pay | Admitting: Family Medicine

## 2024-12-28 VITALS — BP 147/76 | HR 56 | Temp 98.3°F | Ht 63.0 in | Wt 229.0 lb

## 2024-12-28 DIAGNOSIS — I10 Essential (primary) hypertension: Secondary | ICD-10-CM | POA: Diagnosis not present

## 2024-12-28 DIAGNOSIS — E119 Type 2 diabetes mellitus without complications: Secondary | ICD-10-CM

## 2024-12-28 DIAGNOSIS — E669 Obesity, unspecified: Secondary | ICD-10-CM | POA: Diagnosis not present

## 2024-12-28 DIAGNOSIS — Z7984 Long term (current) use of oral hypoglycemic drugs: Secondary | ICD-10-CM | POA: Diagnosis not present

## 2024-12-28 DIAGNOSIS — E559 Vitamin D deficiency, unspecified: Secondary | ICD-10-CM | POA: Diagnosis not present

## 2024-12-28 DIAGNOSIS — E1169 Type 2 diabetes mellitus with other specified complication: Secondary | ICD-10-CM

## 2024-12-28 DIAGNOSIS — Z6841 Body Mass Index (BMI) 40.0 and over, adult: Secondary | ICD-10-CM | POA: Diagnosis not present

## 2024-12-28 MED ORDER — VITAMIN D (ERGOCALCIFEROL) 1.25 MG (50000 UNIT) PO CAPS: 50000.0000 [IU] | ORAL_CAPSULE | ORAL | 0 refills | Status: AC

## 2024-12-28 NOTE — Progress Notes (Signed)
 "  Office: [phone]  /  Fax: [phone]  WEIGHT SUMMARY AND BIOMETRICS  Anthropometric Measurements Height: 5' 3 (1.6 m) Weight: 229 lb (103.9 kg) BMI (Calculated): 40.58 Weight at Last Visit: 227 lb Weight Lost Since Last Visit: 0 Weight Gained Since Last Visit: 2 lb Starting Weight: 225 lb Total Weight Loss (lbs): 0 lb (0 kg) Peak Weight: 262 lb   Body Composition  Body Fat %: 50.4 % Fat Mass (lbs): 115.8 lbs Muscle Mass (lbs): 108 lbs Total Body Water (lbs): 82.4 lbs Visceral Fat Rating : 17   Other Clinical Data RMR: 1814 Fasting: no Labs: no Today's Visit #: 25 Starting Date: 11/26/21    Chief Complaint: OBESITY   History of Present Illness Cynthia Short is a 69 year old female who presents for obesity treatment and progress assessment.  She has been following a journaling plan with a daily intake of 1400 calories and 85 or more grams of protein, but has struggled to adhere to this plan through the holidays. Over the last two months, she has gained two pounds, including during Thanksgiving, Christmas, and New Year's, which she notes is less weight gain than expected.  She experiences knee and foot issues that have made exercise difficult, contributing to her challenges in managing her weight.  Her blood pressure was recorded at 145/72 mmHg and 147/76 mmHg on repeat measurement. She is currently taking losartan 50 mg daily, amlodipine  5 mg daily, and atenolol  25 mg daily.  She has a history of vitamin D  deficiency, for which she is being treated with prescription ergocalciferol  50,000 IU per week. Her last vitamin D  level was 35 ng/mL in June, and she is due for lab checks soon. She requests a refill of this medication.  She has questions about her DM and if it is time to look at other treatment options instead of metformin , which she feels is causing some GI upset.      PHYSICAL EXAM:  Blood pressure (!) 147/76, pulse (!) 56, temperature 98.3 F  (36.8 C), height 5' 3 (1.6 m), weight 229 lb (103.9 kg), SpO2 95%. Body mass index is 40.57 kg/m.  DIAGNOSTIC DATA REVIEWED BY MYSELF TODAY:  BMET    Component Value Date/Time   NA 140 06/01/2024 1138   K 4.3 06/01/2024 1138   CL 100 06/01/2024 1138   CO2 23 06/01/2024 1138   GLUCOSE 107 (H) 06/01/2024 1138   BUN 14 06/01/2024 1138   CREATININE 0.67 06/01/2024 1138   CALCIUM 9.4 06/01/2024 1138   GFRNONAA 87 12/04/2020 0000   GFRAA 105 12/04/2020 0000   Lab Results  Component Value Date   HGBA1C 7.2 (H) 06/01/2024   HGBA1C 6.3 (H) 08/26/2018   Lab Results  Component Value Date   INSULIN  12.2 08/12/2023   INSULIN  14.9 08/26/2018   Lab Results  Component Value Date   TSH 2.160 08/12/2023   CBC    Component Value Date/Time   WBC 7.7 11/26/2021 1316   RBC 4.42 11/26/2021 1316   HGB 12.2 11/26/2021 1316   HCT 38.1 11/26/2021 1316   PLT 300 11/26/2021 1316   MCV 86 11/26/2021 1316   MCH 27.6 11/26/2021 1316   MCHC 32.0 11/26/2021 1316   RDW 13.8 11/26/2021 1316   Iron Studies No results found for: IRON, TIBC, FERRITIN, IRONPCTSAT Lipid Panel     Component Value Date/Time   CHOL 150 08/12/2023 1121   TRIG 88 08/12/2023 1121   HDL 47 08/12/2023 1121  CHOLHDL 3.9 11/26/2021 1316   LDLCALC 86 08/12/2023 1121   Hepatic Function Panel     Component Value Date/Time   PROT 7.0 06/01/2024 1138   ALBUMIN 4.2 06/01/2024 1138   AST 16 06/01/2024 1138   ALT 17 06/01/2024 1138   ALKPHOS 90 06/01/2024 1138   BILITOT 0.3 06/01/2024 1138      Component Value Date/Time   TSH 2.160 08/12/2023 1121   Nutritional Lab Results  Component Value Date   VD25OH 35.9 06/01/2024   VD25OH 22.6 (L) 08/12/2023   VD25OH 25.5 (L) 05/01/2022     Assessment and Plan Assessment & Plan Obesity Management is ongoing with a prescribed journaling plan of 1400 calories and 85 or more grams of protein. She has not adhered to the plan due to knee and foot issues, which  have made exercise difficult. There has been a weight gain of two pounds over the last two months, which is less than expected. - Meal planning and prepping ideas discussed - Meal prep recipe handout given - Work on eating out less often as this is not compatible with weight loss, even with making healthier choices.  DMII Not adequately controlled, esp with celebration eating over the holidays. Some possible GI upset with metformin . - Discuss with her PCP later this month, but risks versus benefits of GLP-1 medications discussed, and Mounjaro would be my recommendation. I am happy to prescribe and monitor this for her, based on her PCP's preference, and her preference.  - Continue diet, exercise and weight loss as discussed today as an important part of the treatment plan - Follow up in 1 month   Hypertension Blood pressure is elevated at 145/72 and 147/76. She is currently on losartan 50 mg daily, amlodipine  5 mg daily, and atenolol  25 mg daily. Traditionally, her blood pressure has been controlled during visits. - Continue current antihypertensive regimen - Continue diet, exercise and weight loss as discussed today as an important part of the treatment plan   Vitamin D  deficiency Managed with prescription ergocalciferol  50,000 IU weekly. Her last vitamin D  level was 35 in June. She requests a refill of this medication. - Refilled ergocalciferol  50,000 IU weekly        Patients who are on anti-obesity medications are counseled on the importance of maintaining healthy lifestyle habits, including balanced nutrition, regular physical activity, and behavioral modifications,  Medication is an adjunct to, not a replacement for, lifestyle changes and that the long-term success and weight maintenance depend on continued adherence to these strategies.   Cynthia Short was informed of the importance of frequent follow up visits to maximize her success with intensive lifestyle modifications for her obesity  and obesity related health conditions as recommended by USPSTF and CMS guidelines  I personally spent a total of 59 minutes in the care of the patient today including preparing to see the patient, reviewing separately obtained history, performing a medically appropriate evaluation of current problems, placing orders in the EMR, documenting clinical information in the EMR, customized nutritional counseling for their specific health and social needs, providing recipes that meet their specific nutritional needs, and discussing eating out strategies and ways to minimize common nutritional pitfalls.  Louann Penton, MD   "

## 2025-01-02 ENCOUNTER — Ambulatory Visit: Admitting: Podiatry

## 2025-01-02 DIAGNOSIS — M7731 Calcaneal spur, right foot: Secondary | ICD-10-CM | POA: Diagnosis not present

## 2025-01-02 DIAGNOSIS — M7661 Achilles tendinitis, right leg: Secondary | ICD-10-CM

## 2025-01-02 DIAGNOSIS — M79671 Pain in right foot: Secondary | ICD-10-CM

## 2025-01-02 NOTE — Patient Instructions (Signed)

## 2025-01-02 NOTE — Progress Notes (Unsigned)
 Pain-away

## 2025-01-05 ENCOUNTER — Ambulatory Visit

## 2025-01-25 ENCOUNTER — Ambulatory Visit (INDEPENDENT_AMBULATORY_CARE_PROVIDER_SITE_OTHER): Admitting: Family Medicine
